# Patient Record
Sex: Female | Born: 1935 | Race: White | Hispanic: No | Marital: Married | Smoking: Never smoker
Health system: Southern US, Community
[De-identification: ages and names within clinical notes are randomized; demographics above are authoritative.]

## PROBLEM LIST (undated history)

## (undated) DIAGNOSIS — F0390 Unspecified dementia without behavioral disturbance: Secondary | ICD-10-CM

## (undated) DIAGNOSIS — F419 Anxiety disorder, unspecified: Secondary | ICD-10-CM

## (undated) DIAGNOSIS — E039 Hypothyroidism, unspecified: Secondary | ICD-10-CM

## (undated) DIAGNOSIS — K219 Gastro-esophageal reflux disease without esophagitis: Secondary | ICD-10-CM

## (undated) DIAGNOSIS — E119 Type 2 diabetes mellitus without complications: Secondary | ICD-10-CM

## (undated) DIAGNOSIS — M810 Age-related osteoporosis without current pathological fracture: Secondary | ICD-10-CM

## (undated) DIAGNOSIS — I10 Essential (primary) hypertension: Secondary | ICD-10-CM

## (undated) DIAGNOSIS — L89159 Pressure ulcer of sacral region, unspecified stage: Secondary | ICD-10-CM

---

## 2003-10-07 ENCOUNTER — Ambulatory Visit: Payer: Self-pay | Admitting: Unknown Physician Specialty

## 2003-10-17 ENCOUNTER — Emergency Department: Payer: Self-pay | Admitting: Emergency Medicine

## 2003-11-18 ENCOUNTER — Other Ambulatory Visit: Payer: Self-pay

## 2003-11-18 ENCOUNTER — Emergency Department: Payer: Self-pay | Admitting: Emergency Medicine

## 2004-05-11 ENCOUNTER — Ambulatory Visit: Payer: Self-pay | Admitting: Unknown Physician Specialty

## 2004-06-08 ENCOUNTER — Ambulatory Visit: Payer: Self-pay

## 2004-06-15 ENCOUNTER — Ambulatory Visit: Payer: Self-pay | Admitting: Unknown Physician Specialty

## 2004-07-04 ENCOUNTER — Other Ambulatory Visit: Payer: Self-pay

## 2004-07-04 ENCOUNTER — Emergency Department: Payer: Self-pay | Admitting: Emergency Medicine

## 2004-11-24 ENCOUNTER — Emergency Department: Payer: Self-pay | Admitting: Unknown Physician Specialty

## 2004-12-06 ENCOUNTER — Inpatient Hospital Stay: Payer: Self-pay | Admitting: Internal Medicine

## 2004-12-06 ENCOUNTER — Other Ambulatory Visit: Payer: Self-pay

## 2005-05-09 ENCOUNTER — Ambulatory Visit: Payer: Self-pay | Admitting: Internal Medicine

## 2005-05-09 DIAGNOSIS — E119 Type 2 diabetes mellitus without complications: Secondary | ICD-10-CM

## 2005-05-17 ENCOUNTER — Ambulatory Visit: Payer: Self-pay | Admitting: *Deleted

## 2005-05-31 ENCOUNTER — Ambulatory Visit: Payer: Self-pay | Admitting: *Deleted

## 2005-06-11 ENCOUNTER — Ambulatory Visit: Payer: Self-pay | Admitting: *Deleted

## 2005-07-02 ENCOUNTER — Ambulatory Visit: Payer: Self-pay | Admitting: *Deleted

## 2005-07-19 ENCOUNTER — Ambulatory Visit: Payer: Self-pay | Admitting: *Deleted

## 2005-07-27 ENCOUNTER — Ambulatory Visit: Payer: Self-pay | Admitting: *Deleted

## 2005-08-03 ENCOUNTER — Ambulatory Visit: Payer: Self-pay | Admitting: *Deleted

## 2005-09-20 ENCOUNTER — Ambulatory Visit: Payer: Self-pay | Admitting: *Deleted

## 2005-10-10 ENCOUNTER — Ambulatory Visit: Payer: Self-pay | Admitting: Internal Medicine

## 2005-11-29 ENCOUNTER — Ambulatory Visit: Payer: Self-pay | Admitting: *Deleted

## 2006-01-19 ENCOUNTER — Emergency Department: Payer: Self-pay | Admitting: Emergency Medicine

## 2006-01-19 ENCOUNTER — Other Ambulatory Visit: Payer: Self-pay

## 2006-01-21 ENCOUNTER — Ambulatory Visit: Payer: Self-pay | Admitting: *Deleted

## 2006-02-26 ENCOUNTER — Ambulatory Visit: Payer: Self-pay | Admitting: Unknown Physician Specialty

## 2006-03-21 ENCOUNTER — Ambulatory Visit: Payer: Self-pay | Admitting: *Deleted

## 2006-04-10 DIAGNOSIS — I10 Essential (primary) hypertension: Secondary | ICD-10-CM

## 2006-04-10 DIAGNOSIS — E1149 Type 2 diabetes mellitus with other diabetic neurological complication: Secondary | ICD-10-CM

## 2006-04-10 DIAGNOSIS — E039 Hypothyroidism, unspecified: Secondary | ICD-10-CM | POA: Insufficient documentation

## 2006-04-10 DIAGNOSIS — K219 Gastro-esophageal reflux disease without esophagitis: Secondary | ICD-10-CM | POA: Insufficient documentation

## 2006-04-10 DIAGNOSIS — E7800 Pure hypercholesterolemia, unspecified: Secondary | ICD-10-CM | POA: Insufficient documentation

## 2006-04-10 DIAGNOSIS — I2510 Atherosclerotic heart disease of native coronary artery without angina pectoris: Secondary | ICD-10-CM | POA: Insufficient documentation

## 2006-04-10 DIAGNOSIS — K5900 Constipation, unspecified: Secondary | ICD-10-CM | POA: Insufficient documentation

## 2006-04-10 DIAGNOSIS — F068 Other specified mental disorders due to known physiological condition: Secondary | ICD-10-CM | POA: Insufficient documentation

## 2006-04-10 DIAGNOSIS — F411 Generalized anxiety disorder: Secondary | ICD-10-CM | POA: Insufficient documentation

## 2006-05-16 ENCOUNTER — Ambulatory Visit: Payer: Self-pay | Admitting: *Deleted

## 2006-05-28 ENCOUNTER — Telehealth (INDEPENDENT_AMBULATORY_CARE_PROVIDER_SITE_OTHER): Payer: Self-pay | Admitting: *Deleted

## 2006-06-05 ENCOUNTER — Encounter (INDEPENDENT_AMBULATORY_CARE_PROVIDER_SITE_OTHER): Payer: Self-pay | Admitting: *Deleted

## 2006-06-27 ENCOUNTER — Encounter (INDEPENDENT_AMBULATORY_CARE_PROVIDER_SITE_OTHER): Payer: Self-pay | Admitting: *Deleted

## 2007-01-14 ENCOUNTER — Emergency Department: Payer: Self-pay | Admitting: Internal Medicine

## 2007-01-14 ENCOUNTER — Other Ambulatory Visit: Payer: Self-pay

## 2007-08-23 ENCOUNTER — Encounter: Payer: Self-pay | Admitting: Internal Medicine

## 2007-09-05 ENCOUNTER — Encounter: Payer: Self-pay | Admitting: Internal Medicine

## 2007-10-13 ENCOUNTER — Emergency Department: Payer: Self-pay | Admitting: Emergency Medicine

## 2007-10-13 ENCOUNTER — Other Ambulatory Visit: Payer: Self-pay

## 2007-10-24 ENCOUNTER — Encounter: Payer: Self-pay | Admitting: Internal Medicine

## 2009-04-25 ENCOUNTER — Ambulatory Visit: Payer: Self-pay | Admitting: Unknown Physician Specialty

## 2011-03-11 ENCOUNTER — Emergency Department: Payer: Self-pay | Admitting: Emergency Medicine

## 2011-03-11 LAB — COMPREHENSIVE METABOLIC PANEL
Alkaline Phosphatase: 81 U/L (ref 50–136)
Anion Gap: 13 (ref 7–16)
BUN: 25 mg/dL — ABNORMAL HIGH (ref 7–18)
Calcium, Total: 9.6 mg/dL (ref 8.5–10.1)
Chloride: 98 mmol/L (ref 98–107)
Co2: 23 mmol/L (ref 21–32)
Creatinine: 1.39 mg/dL — ABNORMAL HIGH (ref 0.60–1.30)
EGFR (African American): 46 — ABNORMAL LOW
EGFR (Non-African Amer.): 38 — ABNORMAL LOW
Potassium: 4.2 mmol/L (ref 3.5–5.1)
SGOT(AST): 31 U/L (ref 15–37)
SGPT (ALT): 22 U/L
Sodium: 134 mmol/L — ABNORMAL LOW (ref 136–145)
Total Protein: 8.1 g/dL (ref 6.4–8.2)

## 2011-03-11 LAB — CBC WITH DIFFERENTIAL/PLATELET
Basophil #: 0 10*3/uL (ref 0.0–0.1)
Basophil %: 0.1 %
HCT: 38.8 % (ref 35.0–47.0)
HGB: 13.2 g/dL (ref 12.0–16.0)
Lymphocyte #: 1.3 10*3/uL (ref 1.0–3.6)
Lymphocyte %: 10.3 %
MCV: 96 fL (ref 80–100)
Monocyte #: 0.7 10*3/uL (ref 0.0–0.7)
Monocyte %: 6.1 %
Neutrophil %: 83.5 %
Platelet: 136 10*3/uL — ABNORMAL LOW (ref 150–440)
RBC: 4.03 10*6/uL (ref 3.80–5.20)
RDW: 12.9 % (ref 11.5–14.5)
WBC: 12.2 10*3/uL — ABNORMAL HIGH (ref 3.6–11.0)

## 2011-03-11 LAB — URINALYSIS, COMPLETE
Bilirubin,UR: NEGATIVE
Blood: NEGATIVE
Glucose,UR: 50 mg/dL (ref 0–75)
Hyaline Cast: 3
Ph: 6 (ref 4.5–8.0)
Protein: 30
Specific Gravity: 1.015 (ref 1.003–1.030)
WBC UR: 7 /HPF (ref 0–5)

## 2011-03-11 LAB — TROPONIN I: Troponin-I: 0.05 ng/mL

## 2011-03-14 ENCOUNTER — Ambulatory Visit: Payer: Self-pay | Admitting: Family Medicine

## 2012-07-24 ENCOUNTER — Inpatient Hospital Stay: Payer: Self-pay | Admitting: Student

## 2012-07-24 LAB — URINALYSIS, COMPLETE
Bilirubin,UR: NEGATIVE
Glucose,UR: NEGATIVE mg/dL (ref 0–75)
Ketone: NEGATIVE
Protein: 100
RBC,UR: 24 /HPF (ref 0–5)
Specific Gravity: 1.014 (ref 1.003–1.030)
Squamous Epithelial: NONE SEEN
WBC UR: 3083 /HPF (ref 0–5)

## 2012-07-24 LAB — TROPONIN I: Troponin-I: <0.02 ng/mL

## 2012-07-24 LAB — CBC WITH DIFFERENTIAL/PLATELET
Basophil #: 0.1 10*3/uL (ref 0.0–0.1)
Lymphocyte %: 35.6 %
MCH: 32.3 pg (ref 26.0–34.0)
Monocyte #: 0.9 x10 3/mm (ref 0.2–0.9)
Monocyte %: 9.2 %
Neutrophil %: 54.1 %
RBC: 3.92 10*6/uL (ref 3.80–5.20)
WBC: 9.9 10*3/uL (ref 3.6–11.0)

## 2012-07-24 LAB — COMPREHENSIVE METABOLIC PANEL
Alkaline Phosphatase: 87 U/L (ref 50–136)
Bilirubin,Total: 0.4 mg/dL (ref 0.2–1.0)
Calcium, Total: 9.8 mg/dL (ref 8.5–10.1)
Chloride: 101 mmol/L (ref 98–107)
Co2: 22 mmol/L (ref 21–32)
Potassium: 5.2 mmol/L — ABNORMAL HIGH (ref 3.5–5.1)
Total Protein: 7.5 g/dL (ref 6.4–8.2)

## 2012-07-25 LAB — BASIC METABOLIC PANEL
BUN: 28 mg/dL — ABNORMAL HIGH (ref 7–18)
Calcium, Total: 9.3 mg/dL (ref 8.5–10.1)
Creatinine: 1.14 mg/dL (ref 0.60–1.30)
EGFR (African American): 48 — ABNORMAL LOW
EGFR (Non-African Amer.): 42 — ABNORMAL LOW
Glucose: 141 mg/dL — ABNORMAL HIGH (ref 65–99)
Sodium: 135 mmol/L — ABNORMAL LOW (ref 136–145)

## 2012-07-26 LAB — BASIC METABOLIC PANEL
Anion Gap: 7 (ref 7–16)
Calcium, Total: 9.3 mg/dL (ref 8.5–10.1)
Chloride: 104 mmol/L (ref 98–107)
Co2: 24 mmol/L (ref 21–32)
Potassium: 4 mmol/L (ref 3.5–5.1)
Sodium: 135 mmol/L — ABNORMAL LOW (ref 136–145)

## 2012-07-27 LAB — URINE CULTURE

## 2012-10-07 ENCOUNTER — Emergency Department: Payer: Self-pay | Admitting: Emergency Medicine

## 2012-10-07 LAB — URINALYSIS, COMPLETE
Bilirubin,UR: NEGATIVE
Blood: NEGATIVE
Ph: 5 (ref 4.5–8.0)
RBC,UR: 3 /HPF (ref 0–5)
Squamous Epithelial: 6
WBC UR: 160 /HPF (ref 0–5)

## 2012-10-07 LAB — COMPREHENSIVE METABOLIC PANEL
Albumin: 3.3 g/dL — ABNORMAL LOW (ref 3.4–5.0)
Anion Gap: 4 — ABNORMAL LOW (ref 7–16)
BUN: 28 mg/dL — ABNORMAL HIGH (ref 7–18)
Bilirubin,Total: 0.4 mg/dL (ref 0.2–1.0)
Calcium, Total: 9.7 mg/dL (ref 8.5–10.1)
Co2: 26 mmol/L (ref 21–32)
EGFR (African American): 40 — ABNORMAL LOW
EGFR (Non-African Amer.): 35 — ABNORMAL LOW
Osmolality: 279 (ref 275–301)
Sodium: 135 mmol/L — ABNORMAL LOW (ref 136–145)
Total Protein: 7.6 g/dL (ref 6.4–8.2)

## 2012-10-07 LAB — CBC
HCT: 37.7 % (ref 35.0–47.0)
HGB: 13.2 g/dL (ref 12.0–16.0)
MCH: 33.5 pg (ref 26.0–34.0)
MCHC: 34.8 g/dL (ref 32.0–36.0)
Platelet: 154 10*3/uL (ref 150–440)
RBC: 3.93 10*6/uL (ref 3.80–5.20)
WBC: 10.5 10*3/uL (ref 3.6–11.0)

## 2013-01-28 ENCOUNTER — Emergency Department: Payer: Self-pay | Admitting: Internal Medicine

## 2013-01-28 LAB — CBC
HCT: 36 % (ref 35.0–47.0)
HGB: 12.5 g/dL (ref 12.0–16.0)
MCH: 33.3 pg (ref 26.0–34.0)
MCHC: 34.7 g/dL (ref 32.0–36.0)
MCV: 96 fL (ref 80–100)
PLATELETS: 143 10*3/uL — AB (ref 150–440)
RBC: 3.75 10*6/uL — ABNORMAL LOW (ref 3.80–5.20)
RDW: 13 % (ref 11.5–14.5)
WBC: 8.7 10*3/uL (ref 3.6–11.0)

## 2013-01-28 LAB — URINALYSIS, COMPLETE
BLOOD: NEGATIVE
Bilirubin,UR: NEGATIVE
Glucose,UR: NEGATIVE mg/dL (ref 0–75)
KETONE: NEGATIVE
Nitrite: NEGATIVE
PH: 5 (ref 4.5–8.0)
Protein: 100
SPECIFIC GRAVITY: 1.015 (ref 1.003–1.030)
WBC UR: >30 /HPF (ref 0–5)

## 2013-01-28 LAB — COMPREHENSIVE METABOLIC PANEL
ALBUMIN: 3.4 g/dL (ref 3.4–5.0)
AST: 21 U/L (ref 15–37)
Alkaline Phosphatase: 77 U/L
Anion Gap: 6 — ABNORMAL LOW (ref 7–16)
BUN: 35 mg/dL — ABNORMAL HIGH (ref 7–18)
Bilirubin,Total: 0.4 mg/dL (ref 0.2–1.0)
CHLORIDE: 105 mmol/L (ref 98–107)
CO2: 24 mmol/L (ref 21–32)
CREATININE: 1.47 mg/dL — AB (ref 0.60–1.30)
Calcium, Total: 9.6 mg/dL (ref 8.5–10.1)
EGFR (African American): 35 — ABNORMAL LOW
EGFR (Non-African Amer.): 30 — ABNORMAL LOW
GLUCOSE: 154 mg/dL — AB (ref 65–99)
OSMOLALITY: 281 (ref 275–301)
Potassium: 4.6 mmol/L (ref 3.5–5.1)
SGPT (ALT): 27 U/L (ref 12–78)
Sodium: 135 mmol/L — ABNORMAL LOW (ref 136–145)
Total Protein: 7.3 g/dL (ref 6.4–8.2)

## 2013-01-28 LAB — TROPONIN I: Troponin-I: <0.02 ng/mL

## 2013-05-27 ENCOUNTER — Inpatient Hospital Stay: Payer: Self-pay | Admitting: Internal Medicine

## 2013-05-27 LAB — URINALYSIS, COMPLETE
BILIRUBIN, UR: NEGATIVE
Bacteria: NONE SEEN
GLUCOSE, UR: NEGATIVE mg/dL (ref 0–75)
KETONE: NEGATIVE
Nitrite: NEGATIVE
PH: 6 (ref 4.5–8.0)
PROTEIN: NEGATIVE
Specific Gravity: 1.004 (ref 1.003–1.030)
Squamous Epithelial: 7
WBC UR: 41 /HPF (ref 0–5)

## 2013-05-27 LAB — COMPREHENSIVE METABOLIC PANEL
ANION GAP: 8 (ref 7–16)
Albumin: 3.1 g/dL — ABNORMAL LOW (ref 3.4–5.0)
Alkaline Phosphatase: 74 U/L
BUN: 21 mg/dL — ABNORMAL HIGH (ref 7–18)
Bilirubin,Total: 0.5 mg/dL (ref 0.2–1.0)
CALCIUM: 9.1 mg/dL (ref 8.5–10.1)
CHLORIDE: 84 mmol/L — AB (ref 98–107)
Co2: 23 mmol/L (ref 21–32)
Creatinine: 1.08 mg/dL (ref 0.60–1.30)
EGFR (African American): 51 — ABNORMAL LOW
EGFR (Non-African Amer.): 44 — ABNORMAL LOW
GLUCOSE: 128 mg/dL — AB (ref 65–99)
OSMOLALITY: 238 (ref 275–301)
POTASSIUM: 5 mmol/L (ref 3.5–5.1)
SGOT(AST): 29 U/L (ref 15–37)
SGPT (ALT): 24 U/L (ref 12–78)
Sodium: 115 mmol/L — CL (ref 136–145)
TOTAL PROTEIN: 7.2 g/dL (ref 6.4–8.2)

## 2013-05-27 LAB — CBC
HCT: 34.2 % — AB (ref 35.0–47.0)
HGB: 12 g/dL (ref 12.0–16.0)
MCH: 31.7 pg (ref 26.0–34.0)
MCHC: 35.1 g/dL (ref 32.0–36.0)
MCV: 91 fL (ref 80–100)
PLATELETS: 155 10*3/uL (ref 150–440)
RBC: 3.78 10*6/uL — AB (ref 3.80–5.20)
RDW: 12.5 % (ref 11.5–14.5)
WBC: 11.7 10*3/uL — ABNORMAL HIGH (ref 3.6–11.0)

## 2013-05-27 LAB — TSH: Thyroid Stimulating Horm: 2.3 u[IU]/mL

## 2013-05-27 LAB — TROPONIN I

## 2013-05-27 LAB — HEMOGLOBIN A1C: HEMOGLOBIN A1C: 7.8 % — AB (ref 4.2–6.3)

## 2013-05-28 LAB — OSMOLALITY: OSMOLALITY: 250 mosm/kg — AB (ref 280–301)

## 2013-05-28 LAB — OSMOLALITY, URINE
Osmolality: 745 mOsm/kg
Osmolality: 292 mOsm/kg

## 2013-05-28 LAB — CBC WITH DIFFERENTIAL/PLATELET
Basophil #: 0.1 10*3/uL (ref 0.0–0.1)
Basophil %: 0.6 %
EOS PCT: 0.9 %
Eosinophil #: 0.1 10*3/uL (ref 0.0–0.7)
HCT: 34.9 % — AB (ref 35.0–47.0)
HGB: 12.4 g/dL (ref 12.0–16.0)
LYMPHS ABS: 2.8 10*3/uL (ref 1.0–3.6)
Lymphocyte %: 31.1 %
MCH: 32.4 pg (ref 26.0–34.0)
MCHC: 35.5 g/dL (ref 32.0–36.0)
MCV: 91 fL (ref 80–100)
Monocyte #: 0.7 x10 3/mm (ref 0.2–0.9)
Monocyte %: 8.1 %
NEUTROS ABS: 5.3 10*3/uL (ref 1.4–6.5)
NEUTROS PCT: 59.3 %
Platelet: 157 10*3/uL (ref 150–440)
RBC: 3.82 10*6/uL (ref 3.80–5.20)
RDW: 12.7 % (ref 11.5–14.5)
WBC: 9 10*3/uL (ref 3.6–11.0)

## 2013-05-28 LAB — BASIC METABOLIC PANEL
Anion Gap: 6 — ABNORMAL LOW (ref 7–16)
BUN: 18 mg/dL (ref 7–18)
CALCIUM: 9.2 mg/dL (ref 8.5–10.1)
Chloride: 82 mmol/L — ABNORMAL LOW (ref 98–107)
Co2: 28 mmol/L (ref 21–32)
Creatinine: 1.15 mg/dL (ref 0.60–1.30)
EGFR (African American): 47 — ABNORMAL LOW
EGFR (Non-African Amer.): 41 — ABNORMAL LOW
Glucose: 150 mg/dL — ABNORMAL HIGH (ref 65–99)
Osmolality: 240 (ref 275–301)
Potassium: 4.2 mmol/L (ref 3.5–5.1)
Sodium: 116 mmol/L — CL (ref 136–145)

## 2013-05-28 LAB — URIC ACID: Uric Acid: 3.3 mg/dL (ref 2.6–6.0)

## 2013-05-28 LAB — SODIUM
Sodium: 115 mmol/L — CL (ref 136–145)
Sodium: 115 mmol/L — CL (ref 136–145)

## 2013-05-29 LAB — SODIUM
SODIUM: 116 mmol/L — AB (ref 136–145)
SODIUM: 118 mmol/L — AB (ref 136–145)
Sodium: 121 mmol/L — ABNORMAL LOW (ref 136–145)

## 2013-05-29 LAB — BASIC METABOLIC PANEL
ANION GAP: 8 (ref 7–16)
BUN: 20 mg/dL — AB (ref 7–18)
CHLORIDE: 83 mmol/L — AB (ref 98–107)
CREATININE: 0.95 mg/dL (ref 0.60–1.30)
Calcium, Total: 8.7 mg/dL (ref 8.5–10.1)
Co2: 27 mmol/L (ref 21–32)
EGFR (Non-African Amer.): 52 — ABNORMAL LOW
GFR CALC AF AMER: 60 — AB
Glucose: 133 mg/dL — ABNORMAL HIGH (ref 65–99)
OSMOLALITY: 243 (ref 275–301)
Potassium: 4.2 mmol/L (ref 3.5–5.1)
SODIUM: 118 mmol/L — AB (ref 136–145)

## 2013-05-30 LAB — SODIUM
SODIUM: 128 mmol/L — AB (ref 136–145)
Sodium: 124 mmol/L — ABNORMAL LOW (ref 136–145)
Sodium: 127 mmol/L — ABNORMAL LOW (ref 136–145)
Sodium: 128 mmol/L — ABNORMAL LOW (ref 136–145)
Sodium: 130 mmol/L — ABNORMAL LOW (ref 136–145)
Sodium: 131 mmol/L — ABNORMAL LOW (ref 136–145)

## 2013-05-30 LAB — AMMONIA: AMMONIA, PLASMA: 13 umol/L (ref 11–32)

## 2013-05-31 LAB — BASIC METABOLIC PANEL
Anion Gap: 7 (ref 7–16)
BUN: 15 mg/dL (ref 7–18)
CALCIUM: 9.3 mg/dL (ref 8.5–10.1)
CREATININE: 1.02 mg/dL (ref 0.60–1.30)
Chloride: 99 mmol/L (ref 98–107)
Co2: 25 mmol/L (ref 21–32)
EGFR (Non-African Amer.): 47 — ABNORMAL LOW
GFR CALC AF AMER: 55 — AB
GLUCOSE: 143 mg/dL — AB (ref 65–99)
Osmolality: 266 (ref 275–301)
POTASSIUM: 3.7 mmol/L (ref 3.5–5.1)
SODIUM: 131 mmol/L — AB (ref 136–145)

## 2013-05-31 LAB — ALBUMIN: Albumin: 3.1 g/dL — ABNORMAL LOW (ref 3.4–5.0)

## 2013-05-31 LAB — SODIUM
SODIUM: 132 mmol/L — AB (ref 136–145)
Sodium: 132 mmol/L — ABNORMAL LOW (ref 136–145)
Sodium: 133 mmol/L — ABNORMAL LOW (ref 136–145)
Sodium: 134 mmol/L — ABNORMAL LOW (ref 136–145)

## 2013-06-01 ENCOUNTER — Ambulatory Visit: Payer: Self-pay | Admitting: Neurology

## 2013-06-01 LAB — CULTURE, BLOOD (SINGLE)

## 2013-06-01 LAB — CBC WITH DIFFERENTIAL/PLATELET
Basophil #: 0.1 10*3/uL (ref 0.0–0.1)
Basophil %: 0.7 %
Eosinophil #: 0 10*3/uL (ref 0.0–0.7)
Eosinophil %: 0.4 %
HCT: 30.5 % — AB (ref 35.0–47.0)
HGB: 10.4 g/dL — ABNORMAL LOW (ref 12.0–16.0)
LYMPHS PCT: 31.5 %
Lymphocyte #: 3.4 10*3/uL (ref 1.0–3.6)
MCH: 32.1 pg (ref 26.0–34.0)
MCHC: 34.1 g/dL (ref 32.0–36.0)
MCV: 94 fL (ref 80–100)
Monocyte #: 1.2 x10 3/mm — ABNORMAL HIGH (ref 0.2–0.9)
Monocyte %: 11.2 %
NEUTROS ABS: 6 10*3/uL (ref 1.4–6.5)
Neutrophil %: 56.2 %
Platelet: 155 10*3/uL (ref 150–440)
RBC: 3.24 10*6/uL — ABNORMAL LOW (ref 3.80–5.20)
RDW: 13 % (ref 11.5–14.5)
WBC: 10.7 10*3/uL (ref 3.6–11.0)

## 2013-06-01 LAB — PHENYTOIN LEVEL, TOTAL: Dilantin: 12.2 ug/mL (ref 10.0–20.0)

## 2013-06-01 LAB — SODIUM: SODIUM: 134 mmol/L — AB (ref 136–145)

## 2013-06-02 LAB — PHENYTOIN LEVEL, TOTAL: DILANTIN: 14.4 ug/mL (ref 10.0–20.0)

## 2013-06-02 LAB — SODIUM: SODIUM: 132 mmol/L — AB (ref 136–145)

## 2013-06-03 LAB — URINE CULTURE

## 2013-06-15 ENCOUNTER — Emergency Department: Payer: Self-pay | Admitting: Emergency Medicine

## 2013-06-18 ENCOUNTER — Emergency Department: Payer: Self-pay

## 2013-06-23 ENCOUNTER — Emergency Department: Payer: Self-pay | Admitting: Emergency Medicine

## 2013-06-27 ENCOUNTER — Inpatient Hospital Stay: Payer: Self-pay | Admitting: Surgery

## 2013-06-27 LAB — URINALYSIS, COMPLETE
BILIRUBIN, UR: NEGATIVE
Glucose,UR: >=500 mg/dL (ref 0–75)
Ketone: NEGATIVE
Nitrite: NEGATIVE
Ph: 6 (ref 4.5–8.0)
Specific Gravity: 1.006 (ref 1.003–1.030)
Squamous Epithelial: <1
WBC UR: 76 /HPF (ref 0–5)

## 2013-06-27 LAB — CBC
HCT: 34.2 % — AB (ref 35.0–47.0)
HGB: 11.7 g/dL — AB (ref 12.0–16.0)
MCH: 31.5 pg (ref 26.0–34.0)
MCHC: 34.3 g/dL (ref 32.0–36.0)
MCV: 92 fL (ref 80–100)
Platelet: 192 10*3/uL (ref 150–440)
RBC: 3.72 10*6/uL — ABNORMAL LOW (ref 3.80–5.20)
RDW: 13.1 % (ref 11.5–14.5)
WBC: 11.7 10*3/uL — ABNORMAL HIGH (ref 3.6–11.0)

## 2013-06-27 LAB — OSMOLALITY: OSMOLALITY: 258 mosm/kg — AB (ref 280–301)

## 2013-06-27 LAB — COMPREHENSIVE METABOLIC PANEL
ALT: 50 U/L (ref 12–78)
ANION GAP: 5 — AB (ref 7–16)
AST: 34 U/L (ref 15–37)
Albumin: 3 g/dL — ABNORMAL LOW (ref 3.4–5.0)
Alkaline Phosphatase: 193 U/L — ABNORMAL HIGH
BUN: 18 mg/dL (ref 7–18)
Bilirubin,Total: 0.4 mg/dL (ref 0.2–1.0)
CALCIUM: 8.8 mg/dL (ref 8.5–10.1)
Chloride: 81 mmol/L — ABNORMAL LOW (ref 98–107)
Co2: 27 mmol/L (ref 21–32)
Creatinine: 0.71 mg/dL (ref 0.60–1.30)
EGFR (African American): >60
EGFR (Non-African Amer.): >60
Glucose: 271 mg/dL — ABNORMAL HIGH (ref 65–99)
Osmolality: 241 (ref 275–301)
Potassium: 4.6 mmol/L (ref 3.5–5.1)
SODIUM: 113 mmol/L — AB (ref 136–145)
TOTAL PROTEIN: 6.9 g/dL (ref 6.4–8.2)

## 2013-06-27 LAB — OSMOLALITY, URINE: OSMOLALITY: 339 mosm/kg

## 2013-06-27 LAB — URIC ACID: Uric Acid: 1.8 mg/dL — ABNORMAL LOW (ref 2.6–6.0)

## 2013-06-27 LAB — SODIUM
SODIUM: 116 mmol/L — AB (ref 136–145)
Sodium: 121 mmol/L — ABNORMAL LOW (ref 136–145)
Sodium: 117 mmol/L — CL (ref 136–145)
Sodium: 115 mmol/L — CL (ref 136–145)

## 2013-06-27 LAB — SODIUM, URINE, RANDOM: SODIUM, URINE RANDOM: 75 mmol/L (ref 20–110)

## 2013-06-28 LAB — BASIC METABOLIC PANEL
Anion Gap: 5 — ABNORMAL LOW (ref 7–16)
BUN: 13 mg/dL (ref 7–18)
CO2: 27 mmol/L (ref 21–32)
Calcium, Total: 8 mg/dL — ABNORMAL LOW (ref 8.5–10.1)
Chloride: 86 mmol/L — ABNORMAL LOW (ref 98–107)
Creatinine: 0.94 mg/dL (ref 0.60–1.30)
EGFR (African American): >60
GFR CALC NON AF AMER: 52 — AB
GLUCOSE: 114 mg/dL — AB (ref 65–99)
OSMOLALITY: 239 (ref 275–301)
POTASSIUM: 4.2 mmol/L (ref 3.5–5.1)
Sodium: 118 mmol/L — CL (ref 136–145)

## 2013-06-28 LAB — MAGNESIUM
MAGNESIUM: 1.6 mg/dL — AB
MAGNESIUM: 2.2 mg/dL

## 2013-06-28 LAB — SODIUM
SODIUM: 119 mmol/L — AB (ref 136–145)
SODIUM: 124 mmol/L — AB (ref 136–145)
Sodium: 126 mmol/L — ABNORMAL LOW (ref 136–145)
Sodium: 122 mmol/L — ABNORMAL LOW (ref 136–145)
Sodium: 124 mmol/L — ABNORMAL LOW (ref 136–145)

## 2013-06-29 LAB — SODIUM
SODIUM: 130 mmol/L — AB (ref 136–145)
SODIUM: 128 mmol/L — AB (ref 136–145)
Sodium: 130 mmol/L — ABNORMAL LOW (ref 136–145)
Sodium: 129 mmol/L — ABNORMAL LOW (ref 136–145)

## 2013-06-29 LAB — URINE CULTURE

## 2013-06-30 LAB — SODIUM: Sodium: 128 mmol/L — ABNORMAL LOW (ref 136–145)

## 2013-08-22 ENCOUNTER — Emergency Department: Payer: Self-pay | Admitting: Emergency Medicine

## 2013-08-22 LAB — URINALYSIS, COMPLETE
BILIRUBIN, UR: NEGATIVE
BLOOD: NEGATIVE
Ketone: NEGATIVE
NITRITE: POSITIVE
PROTEIN: NEGATIVE
Ph: 6 (ref 4.5–8.0)
Specific Gravity: 1.006 (ref 1.003–1.030)
WBC UR: 24 /HPF (ref 0–5)

## 2013-08-22 LAB — BASIC METABOLIC PANEL
ANION GAP: 10 (ref 7–16)
BUN: 18 mg/dL (ref 7–18)
CREATININE: 1.15 mg/dL (ref 0.60–1.30)
Calcium, Total: 9.5 mg/dL (ref 8.5–10.1)
Chloride: 98 mmol/L (ref 98–107)
Co2: 25 mmol/L (ref 21–32)
EGFR (African American): 47 — ABNORMAL LOW
EGFR (Non-African Amer.): 41 — ABNORMAL LOW
Glucose: 227 mg/dL — ABNORMAL HIGH (ref 65–99)
Osmolality: 275 (ref 275–301)
Potassium: 4.3 mmol/L (ref 3.5–5.1)
Sodium: 133 mmol/L — ABNORMAL LOW (ref 136–145)

## 2013-08-22 LAB — CBC
HCT: 39.1 % (ref 35.0–47.0)
HGB: 12.7 g/dL (ref 12.0–16.0)
MCH: 30 pg (ref 26.0–34.0)
MCHC: 32.4 g/dL (ref 32.0–36.0)
MCV: 93 fL (ref 80–100)
Platelet: 206 10*3/uL (ref 150–440)
RBC: 4.22 10*6/uL (ref 3.80–5.20)
RDW: 13.4 % (ref 11.5–14.5)
WBC: 9.6 10*3/uL (ref 3.6–11.0)

## 2013-08-26 ENCOUNTER — Emergency Department: Payer: Self-pay | Admitting: Emergency Medicine

## 2013-08-26 LAB — URINALYSIS, COMPLETE
Bilirubin,UR: NEGATIVE
Blood: NEGATIVE
Glucose,UR: 50 mg/dL (ref 0–75)
Ketone: NEGATIVE
NITRITE: POSITIVE
Ph: 5 (ref 4.5–8.0)
Protein: 30
Specific Gravity: 1.015 (ref 1.003–1.030)
Squamous Epithelial: 1

## 2013-08-26 LAB — BASIC METABOLIC PANEL
Anion Gap: 10 (ref 7–16)
BUN: 19 mg/dL — ABNORMAL HIGH (ref 7–18)
Calcium, Total: 9.4 mg/dL (ref 8.5–10.1)
Chloride: 98 mmol/L (ref 98–107)
Co2: 23 mmol/L (ref 21–32)
Creatinine: 1.04 mg/dL (ref 0.60–1.30)
GFR CALC AF AMER: 54 — AB
GFR CALC NON AF AMER: 46 — AB
Glucose: 223 mg/dL — ABNORMAL HIGH (ref 65–99)
Osmolality: 272 (ref 275–301)
Potassium: 3.8 mmol/L (ref 3.5–5.1)
SODIUM: 131 mmol/L — AB (ref 136–145)

## 2013-08-26 LAB — CBC WITH DIFFERENTIAL/PLATELET
Basophil #: 0.1 10*3/uL (ref 0.0–0.1)
Basophil %: 0.6 %
EOS ABS: 0.4 10*3/uL (ref 0.0–0.7)
Eosinophil %: 3.3 %
HCT: 34.6 % — ABNORMAL LOW (ref 35.0–47.0)
HGB: 11.8 g/dL — ABNORMAL LOW (ref 12.0–16.0)
Lymphocyte #: 2.5 10*3/uL (ref 1.0–3.6)
Lymphocyte %: 23.2 %
MCH: 31.1 pg (ref 26.0–34.0)
MCHC: 34.1 g/dL (ref 32.0–36.0)
MCV: 91 fL (ref 80–100)
MONOS PCT: 10.2 %
Monocyte #: 1.1 x10 3/mm — ABNORMAL HIGH (ref 0.2–0.9)
Neutrophil #: 6.8 10*3/uL — ABNORMAL HIGH (ref 1.4–6.5)
Neutrophil %: 62.7 %
Platelet: 180 10*3/uL (ref 150–440)
RBC: 3.8 10*6/uL (ref 3.80–5.20)
RDW: 13.5 % (ref 11.5–14.5)
WBC: 10.8 10*3/uL (ref 3.6–11.0)

## 2013-08-28 LAB — URINE CULTURE

## 2013-09-04 ENCOUNTER — Encounter: Payer: Self-pay | Admitting: Surgery

## 2013-09-08 ENCOUNTER — Inpatient Hospital Stay: Payer: Self-pay | Admitting: Internal Medicine

## 2013-09-08 LAB — URINALYSIS, COMPLETE
BILIRUBIN, UR: NEGATIVE
Blood: NEGATIVE
Hyaline Cast: 3
Ketone: NEGATIVE
Nitrite: NEGATIVE
Ph: 5 (ref 4.5–8.0)
Protein: NEGATIVE
RBC,UR: 4 /HPF (ref 0–5)
Specific Gravity: 1.013 (ref 1.003–1.030)

## 2013-09-08 LAB — COMPREHENSIVE METABOLIC PANEL
ALT: 66 U/L — AB
AST: 36 U/L (ref 15–37)
Albumin: 2.5 g/dL — ABNORMAL LOW (ref 3.4–5.0)
Alkaline Phosphatase: 203 U/L — ABNORMAL HIGH
Anion Gap: 9 (ref 7–16)
BUN: 16 mg/dL (ref 7–18)
Bilirubin,Total: 0.3 mg/dL (ref 0.2–1.0)
CALCIUM: 9.3 mg/dL (ref 8.5–10.1)
CHLORIDE: 94 mmol/L — AB (ref 98–107)
CO2: 22 mmol/L (ref 21–32)
CREATININE: 1.37 mg/dL — AB (ref 0.60–1.30)
EGFR (African American): 38 — ABNORMAL LOW
GFR CALC NON AF AMER: 33 — AB
GLUCOSE: 325 mg/dL — AB (ref 65–99)
Osmolality: 265 (ref 275–301)
Potassium: 5.2 mmol/L — ABNORMAL HIGH (ref 3.5–5.1)
SODIUM: 125 mmol/L — AB (ref 136–145)
TOTAL PROTEIN: 7.2 g/dL (ref 6.4–8.2)

## 2013-09-08 LAB — CBC
HCT: 32.9 % — ABNORMAL LOW (ref 35.0–47.0)
HGB: 10.8 g/dL — ABNORMAL LOW (ref 12.0–16.0)
MCH: 30.2 pg (ref 26.0–34.0)
MCHC: 32.8 g/dL (ref 32.0–36.0)
MCV: 92 fL (ref 80–100)
Platelet: 216 10*3/uL (ref 150–440)
RBC: 3.57 10*6/uL — ABNORMAL LOW (ref 3.80–5.20)
RDW: 14.1 % (ref 11.5–14.5)
WBC: 10.6 10*3/uL (ref 3.6–11.0)

## 2013-09-08 LAB — OSMOLALITY, URINE: OSMOLALITY: 344 mosm/kg

## 2013-09-08 LAB — SODIUM, URINE, RANDOM: Sodium, Urine Random: 55 mmol/L (ref 20–110)

## 2013-09-08 LAB — OSMOLALITY: Osmolality: 270 mOsm/kg — ABNORMAL LOW (ref 280–301)

## 2013-09-08 LAB — TROPONIN I

## 2013-09-09 LAB — CBC WITH DIFFERENTIAL/PLATELET
Basophil #: 0 10*3/uL (ref 0.0–0.1)
Basophil %: 0.6 %
EOS ABS: 0.3 10*3/uL (ref 0.0–0.7)
Eosinophil %: 4.1 %
HCT: 34.4 % — AB (ref 35.0–47.0)
HGB: 10.9 g/dL — AB (ref 12.0–16.0)
Lymphocyte #: 2.6 10*3/uL (ref 1.0–3.6)
Lymphocyte %: 33.8 %
MCH: 29.4 pg (ref 26.0–34.0)
MCHC: 31.6 g/dL — ABNORMAL LOW (ref 32.0–36.0)
MCV: 93 fL (ref 80–100)
MONOS PCT: 7.8 %
Monocyte #: 0.6 x10 3/mm (ref 0.2–0.9)
Neutrophil #: 4.1 10*3/uL (ref 1.4–6.5)
Neutrophil %: 53.7 %
PLATELETS: 218 10*3/uL (ref 150–440)
RBC: 3.7 10*6/uL — AB (ref 3.80–5.20)
RDW: 14.3 % (ref 11.5–14.5)
WBC: 7.6 10*3/uL (ref 3.6–11.0)

## 2013-09-09 LAB — BASIC METABOLIC PANEL
Anion Gap: 9 (ref 7–16)
BUN: 11 mg/dL (ref 7–18)
CO2: 21 mmol/L (ref 21–32)
CREATININE: 1.22 mg/dL (ref 0.60–1.30)
Calcium, Total: 9.3 mg/dL (ref 8.5–10.1)
Chloride: 97 mmol/L — ABNORMAL LOW (ref 98–107)
EGFR (African American): 44 — ABNORMAL LOW
GFR CALC NON AF AMER: 38 — AB
Glucose: 301 mg/dL — ABNORMAL HIGH (ref 65–99)
OSMOLALITY: 266 (ref 275–301)
POTASSIUM: 4.5 mmol/L (ref 3.5–5.1)
SODIUM: 127 mmol/L — AB (ref 136–145)

## 2013-09-10 LAB — BASIC METABOLIC PANEL
Anion Gap: 4 — ABNORMAL LOW (ref 7–16)
BUN: 9 mg/dL (ref 7–18)
Calcium, Total: 9.2 mg/dL (ref 8.5–10.1)
Chloride: 98 mmol/L (ref 98–107)
Co2: 26 mmol/L (ref 21–32)
Creatinine: 1.05 mg/dL (ref 0.60–1.30)
EGFR (African American): 53 — ABNORMAL LOW
EGFR (Non-African Amer.): 46 — ABNORMAL LOW
Glucose: 269 mg/dL — ABNORMAL HIGH (ref 65–99)
Osmolality: 265 (ref 275–301)
POTASSIUM: 4.2 mmol/L (ref 3.5–5.1)
Sodium: 128 mmol/L — ABNORMAL LOW (ref 136–145)

## 2013-09-10 LAB — URINE CULTURE

## 2013-09-25 ENCOUNTER — Emergency Department: Payer: Self-pay | Admitting: Emergency Medicine

## 2013-09-25 LAB — URINALYSIS, COMPLETE
BLOOD: NEGATIVE
Bilirubin,UR: NEGATIVE
KETONE: NEGATIVE
Nitrite: NEGATIVE
Ph: 6 (ref 4.5–8.0)
Protein: 30
SPECIFIC GRAVITY: 1.011 (ref 1.003–1.030)
Squamous Epithelial: <1
WBC UR: 42 /HPF (ref 0–5)

## 2013-09-25 LAB — BASIC METABOLIC PANEL
Anion Gap: 7 (ref 7–16)
BUN: 20 mg/dL — AB (ref 7–18)
CALCIUM: 9.1 mg/dL (ref 8.5–10.1)
CO2: 25 mmol/L (ref 21–32)
CREATININE: 0.95 mg/dL (ref 0.60–1.30)
Chloride: 101 mmol/L (ref 98–107)
Glucose: 183 mg/dL — ABNORMAL HIGH (ref 65–99)
Osmolality: 274 (ref 275–301)
Potassium: 5 mmol/L (ref 3.5–5.1)
Sodium: 133 mmol/L — ABNORMAL LOW (ref 136–145)

## 2013-09-25 LAB — CBC
HCT: 36.1 % (ref 35.0–47.0)
HGB: 11.8 g/dL — ABNORMAL LOW (ref 12.0–16.0)
MCH: 29.8 pg (ref 26.0–34.0)
MCHC: 32.9 g/dL (ref 32.0–36.0)
MCV: 91 fL (ref 80–100)
Platelet: 190 10*3/uL (ref 150–440)
RBC: 3.98 10*6/uL (ref 3.80–5.20)
RDW: 14.2 % (ref 11.5–14.5)
WBC: 8.7 10*3/uL (ref 3.6–11.0)

## 2013-09-28 LAB — URINE CULTURE

## 2013-10-02 ENCOUNTER — Emergency Department: Payer: Self-pay | Admitting: Student

## 2013-10-07 ENCOUNTER — Encounter: Payer: Self-pay | Admitting: Surgery

## 2013-11-16 ENCOUNTER — Encounter: Payer: Self-pay | Admitting: Surgery

## 2014-04-23 NOTE — H&P (Signed)
PATIENT NAME:  Gina [name], Gina [name] MR#:  [id number] DATE OF BIRTH:  10/21/1917  DATE OF ADMISSION:  07/24/2012  PRIMARY CARE PHYSICIAN: Gina StainGinette Archinal, MD.   CHIEF COMPLAINT: Fall and weakness.   HISTORY OF PRESENT ILLNESS: This is a 81 year old female who [facility] the hospital [facility] after a fall and suspected syncope. The patient does not recall ever losing consciousness, so unlikely this was a syncope. The patient said this morning, she was attempting to ambulate with the help of her walker when she felt weak and she fell to the ground. She was found by the nursing staff there on the floor; unclear how long she was down, and sent over to the hospital for further evaluation.   The patient, here in the ER, was noted to have mild acute renal failure, also noted to have a urinary tract infection. Hospitalist services were contacted for further treatment and evaluation. The patient denied any prodromal symptoms of palpitations, chest pain, shortness of breath, nausea, vomiting, diarrhea, or any other associated symptoms prior to her fall today.   REVIEW OF SYSTEMS:  CONSTITUTIONAL: No documented fever. No weight gain. No weight loss.  EYES: No blurry or double vision.  ENT: No tinnitus. No postnasal drip. No redness of the oropharynx.  RESPIRATORY: No cough. No wheeze. No hemoptysis. No dyspnea.  CARDIOVASCULAR: No chest pain. No orthopnea. No palpitations. No syncope.  GASTROINTESTINAL: No nausea. No vomiting. No diarrhea. No abdominal pain. No melena or hematochezia.  GENITOURINARY: No dysuria or hematuria.  ENDOCRINE: No polyuria or nocturia. No heat or cold intolerance.  HEME: No anemia. No bruising. No bleeding.  INTEGUMENTARY: No rashes. No lesions.  MUSCULOSKELETAL: No arthritis. No swelling. No gout.  NEUROLOGIC: No numbness or tingling. No ataxia. No seizure activity.  PSYCHIATRIC: No anxiety. No insomnia. No ADD.   PAST MEDICAL HISTORY: Consistent  with diabetes, hypertension, history of recurrent UTIs, osteoporosis.   ALLERGIES: FOSAMAX.   SOCIAL HISTORY: No smoking. No alcohol abuse. No illicit drug abuse. [facility]. Mother died from complications of a stroke. Father died from unknown causes.   CURRENT MEDICATIONS: Metformin 500 mg extended-release daily, Tylenol 500 mg 4  times daily as needed, Xanax 0.25 mg 2 tabs at bedtime, nystatin topical to be applied b.i.d. as needed, omeprazole 20 mg daily, Silvadene cream to be applied to the affected area b.i.d., Robitussin 5 mL q. 6 hours as needed for cough, and Finacea topical gel 15% to be applied b.i.d. as needed.   PHYSICAL EXAMINATION ON ADMISSION:  VITAL SIGNS: Temperature is 97.4. Pulse 62. Respirations 18. Blood pressure 124/62. Saturations  98% on room air.  GENERAL: She is a pleasant-appearing female, hard of hearing, but in no apparent distress.  HEAD, EYES, EARS, NOSE, THROAT: She has a bandage on her left upper forehead from the fall, but no laceration, likely a bruise. Normocephalic. Extraocular muscles intact. Pupils equal and reactive to light. Sclerae anicteric. No conjunctival injection. No oropharyngeal erythema.  NECK: Supple. There is no jugular venous distention. No bruits. No lymphadenopathy. No thyromegaly.  HEART: Regular rate and rhythm. No murmurs. No rubs. No clicks.  LUNGS: Clear to auscultation bilaterally. No rales or rhonchi. No wheezes.  ABDOMEN: Soft, flat, nontender, nondistended. Has good bowel sounds. No hepatosplenomegaly appreciated.  EXTREMITIES: No evidence of any cyanosis, clubbing, or peripheral edema. Has +2 pedal and radial pulses bilaterally.  NEUROLOGICAL: The patient is alert, awake, and oriented x 3. No focal  motor or sensory deficits appreciated bilaterally.  SKIN: Moist and warm with no rash appreciated.  LYMPHATIC: There is no cervical or axillary lymphadenopathy.   LABORATORY, DIAGNOSTIC  AND RADIOLOGIC DATA: Serum glucose of 197, BUN 38, creatinine 1.6, sodium 132, potassium 5.2, chloride 101, bicarb 22. The patient's LFTs are within normal limits. Troponin less than 0.02. White cell count 9.9, hemoglobin 12.7, hematocrit 36.8, platelet count 178. Urinalysis shows 3+ leukocyte esterase, positive nitrites with 3000 white cells and 2+ bacteria.   The patient had a chest x-ray done, which showed no acute cardiopulmonary disease, and a CT of the head done without contrast, which showed moderate diffuse cerebral atrophy, appropriate for patient's age. No evidence of acute intracranial hemorrhage or ischemic infarction. No midline shift. No skull fracture. CT of the cervical spine showing no evidence of any acute cervical spine injury.   ASSESSMENT AND PLAN: This is a 81 year old female with a history of diabetes, hypertension, history of recurrent urinary tract infections, presents to the hospital after a fall and suspected syncope. Also noted to be in acute renal failure with a urinary tract infection.   1.  Status post fall. This is likely a mechanical fall. The patient apparently had some generalized weakness, probably related to her urinary tract infection and dehydration. She never really lost consciousness; therefore, unlikely a suspected syncopal event. I will hydrate her with IV fluids, give her IV antibiotics for a urinary tract infection, get a physical therapy consult to assess her mobility.  2.  Acute renal failure. This is likely secondary to dehydration and urinary tract infection. I will hydrate with IV fluids, follow BUN and creatinine, give her IV ceftriaxone for her urinary tract infection, and follow her urine output.  3.  Hyperkalemia, likely secondary to the acute renal failure, should improve once her renal function is better. I will repeat her potassium in the morning.  4.  Urinary tract infection. Treat her with IV ceftriaxone, follow her urine cultures.  5.  Diabetes.  Hold her metformin now given her acute renal failure. Place on sliding scale insulin. Follow blood sugars.   CODE STATUS: FULL CODE.   TIME SPENT WITH ADMISSION: 45 minutes.    ____________________________ Rolly Pancake. Cherlynn Kaiser, MD vjs:np D: 07/24/2012 17:48:00 ET T: 07/24/2012 18:11:09 ET JOB#: [id number]  cc: Rolly Pancake. Cherlynn Kaiser, MD, <Dictator> Houston Siren MD ELECTRONICALLY SIGNED 08/02/2012 15:17

## 2014-04-23 NOTE — Discharge Summary (Signed)
PATIENT NAME:  Gina [name], Gina [name] MR#:  [id number] DATE OF BIRTH:  [birth year]  DATE OF ADMISSION:  07/24/2012 DATE OF DISCHARGE:  07/27/2012  PRIMARY CARE PHYSICIAN: Dr. Stanford Scotland Archinal.  CHIEF COMPLAINT: Fall and weakness.   DISCHARGE DIAGNOSES: 1.  Falls, secondary to weakness, acute renal failure and urinary tract infection.  2.  Hypertension.  3.  Diabetes.  4.  History of recurrent urinary tract infections.  5.  Osteoporosis.  6.  Hyperkalemia, on arrival.  7.  Hypertension.   DISCHARGE MEDICATIONS: 1.  Keflex 500 mg every 12 hours for 5 days.  2.  Omeprazole 20 mg daily.  3.  Mapap 500 mg 4 times a day as needed.  4.  Metformin 500 mg once a day, extended release.  5.  Silvadene 1% topical cream, apply to affected areas 2 times a day.  6.  Finacea 15% topical gel, apply to affected areas of the 2 times a day.  7.  DC nasal spray 0.65% nasal spray, 2 sprays every 4 hours as needed while awake.  8.  Levothyroxine 112 mcg daily.  9. Metoprolol succinate 25 mg daily.  10.  Hydrochlorothiazide/triamterene 25/37.5 mg, 1 tablet once a day.  11. Verapamil 240 mg once a day.  12. Vitamin C 500 mg once a day.  13. Alprazolam 0.5 mg 2 times a day, and 1 mg at bedtime.  14. Docusate sodium 1 capsule  once a day as needed.  15.  Trazodone 50 mg once a day at bedtime.   DIET: Low-fat, low sodium, low fat, low-cholesterol ADA diet.   ACTIVITY: As tolerated. She is to follow with physical therapy.   FOLLOWUP: Please follow with PCP within 1 to 2 weeks.   SIGNIFICANT LABS AND IMAGING: Initial BUN was 38, creatinine of 1.62, sodium 132, potassium 5.2, serum CO2 of 22.   By discharge as of July 26th BUN was 16, creatinine of 0.9, sodium 135, potassium of 4. LFTs on arrival within normal limits. Initial troponin negative. Initial WBC 9.9, hemoglobin 12.7, platelets are 178, and urine cultures obtained on arrival showed Klebsiella pneumoniae, susceptible to cefazolin, ceftriaxone, and  fluoroquinolones.   Initial UA: 3+ leukocyte esterase, positive for nitrites, 24 RBCs, 3083 WBCs, 2+ bacteria.   CT of head without contrast on arrival showed moderate diffuse cerebral atrophy, appropriate for the patient's age, and mild compensatory ventriculomegaly. No evidence of acute intracranial hemorrhage or evolving ischemic infarct. No midline shift. No evidence of acute skull fracture.   A C-spine without contrast showed no evidence of acute cervical spine fracture nor dislocation. There is bulging disk material versus an end plate spur noted posteriorly at C4-C5 disk level.   HISTORY OF PRESENT ILLNESS AND HOSPITAL COURSE: For full details of H and P, please see the dictation on July 24 by Dr. Cherlynn Kaiser, but briefly this is a pleasant 81 year old female who lives in [facility] who was brought in for suspected syncope. The patient, however, did not recall losing consciousness. She apparently was attempting to ambulate without a walker when she fell down and she was brought into the hospital. She was noted to have grossly dirty urine as well as acute renal failure, admitted to the hospitalist service with some IV fluids. She was started on ceftriaxone, and urine cultures were sent. She also noted to be mildly hyperkalemic secondary to renal failure. She did improve significantly through hospitalization. The fall was likely secondary to generalized weakness, UTI, and acute renal failure and she has been working  with PT. We will discharge her with a referral with physical therapy. The acute renal failure has resolved. This is likely secondary to a UTI and dehydration. She is eating well now and the fluids have been stopped. Her hyperkalemia has resolved. Urine cultures are back. We will discharge her with an additional 5 days of Keflex. Initially we also held her metformin, and upon discharge we can resume that. Initially we did not have her complete med rec, and  started her on amlodipine, low  dose for blood pressure, but at this point she will be discharged  with her outpatient blood pressure medications.   On the day of discharge the patient remains afebrile. Temperature was 98.4 this morning, pulse rate 98, respiratory rate was 20, blood pressure 174/84. Oxygen saturation was 93% on room air.   GENERAL: The patient is an obese Caucasian female sitting in bed eating her lunch. No obvious distress.  HEENT: Normocephalic. There is a slight hematoma and bruising on the left lateral forehead. HEART: Sounds are presently irregularly irregular. No significant murmurs appreciated.  LUNGS: Clear to auscultation.  ABDOMEN: Soft, nontender.  EXTREMITIES: Does not show any lower extremity edema.   At this point she will be discharged.   Total time spent: Thirty-five 35 minutes.   THE PATIENT IS FULL CODE.     ____________________________ Krystal EatonShayiq Edison Wollschlager, MD sa:dm D: 07/27/2012 12:20:15 ET T: 07/27/2012 13:29:43 ET JOB#: [id number]  cc: Krystal EatonShayiq Ashaunte Standley, MD, <Dictator> Ginette A. Andrey SpearmanArchinal, MD Krystal EatonSHAYIQ Rexine Gowens MD ELECTRONICALLY SIGNED 08/14/2012 13:25

## 2014-04-24 NOTE — H&P (Signed)
PATIENT NAME:  [name], Kidada H MR#:  [id number] DATE OF BIRTH:  1917/03/24  DATE OF ADMISSION:  05/27/2013  PRIMARY CARE PHYSICIAN:  Dr. Marguerite OleaMoffett.   HISTORY OF PRESENT ILLNESS:  The patient is a [age] year old Caucasian female with past medical history significant for history of falls, weakness and acute renal failure, urinary tract infection, admission for the same in July 2014 comes back to the hospital with 4 times falls today.  Apparently the patient was falling in the facility where she is from on her knees over the past day or so.  However at the very end of the day she was standing up getting her medications and suddenly she went backwards.  There was no injury reported.  On arrival to the Emergency Room, she was noted to have a very low sodium level at 115 and hospitalist services were contacted for admission.  The patient herself is not able to provide much history; however, most of the history is obtained from family members.  Family is telling me that she is drinking water nonstop ever since she was discharged in July 2014 for dehydration and renal failure.  She was also noted to have lower extremity swelling.  The patient's family tells me that in the past she was on diuretic, however due to dehydration and admission in July 2014 the diuretic was stopped.  The patient was diagnosed with urinary tract infection approximately three weeks ago.  She has been receiving antibiotic therapy ever since.  The patient usually is mentally very alert and remember things.  However, whenever she gets urinary tract infections she usually does not get high fevers or any symptoms except of being disoriented and loony according to the patient's family.   PAST MEDICAL HISTORY:  Significant for history of admission in July 2014 for falls, weakness, acute renal failure, urinary tract infection.  Also history of diabetes mellitus, hypertension, recurrent urinary tract infections, osteoporosis, hypokalemia during the last  admission, also questionable syncope episode.   ALLERGIES:  FOSAMAX.  SOCIAL HISTORY:  No smoking or alcohol abuse.  No illicit drug abuse.  [facility].    FAMILY HISTORY:  The patient's mother died from complications of stroke.  The patient's father died of unknown causes.   MEDICATIONS:  According to medical records, the patient is on alprazolam 0.5 mg 2 tablets which would be 1 mg once daily at bedtime.  Alprazolam 0.5 mg twice daily in am and 4:00 p.m., nasal spray, Levothyroxine 112 mcg by mouth daily, metformin 500 mg once daily which is extended released, metoprolol succinate ER 25 mg by mouth daily, nystatin topical to groin and buttocks twice daily, omeprazole 20 mg by mouth daily, ranitidine 300 mg by mouth at bedtime, trazodone 50 mg by mouth at bedtime, verapamil extended release 240 mg by mouth daily and vitamin C 500 mg by mouth daily.   REVIEW OF SYSTEMS:   Difficult to obtain as the patient is confused, however she denies any significant chest pain, pain in general.  Admits of shortness of breath.  Admits to some cough.  No phlegm production.  Denies any significant abdominal pain or urination troubles.  Otherwise, she is not able to provide much history.  She is not reliable.  PHYSICAL EXAMINATION:  VITAL SIGNS:  On arrival to the hospital, temperature was 98.5, pulse was 65, respiration rate was 18, blood pressure 214/110, respiration rate was 18, pulse oximetry was 98% on room air.  GENERAL:  This is  a well-developed, well-nourished, obese Caucasian female in no significant distress, comfortable on the stretcher.  EYES:  Her pupils are equal, reactive to light.  Extraocular muscles intact.  No icterus or conjunctivitis.  Has normal hearing.  No pharyngeal erythema.  Mucosa is moist.  NECK:  No masses.  Supple, nontender.  Thyroid is not enlarged.  No adenopathy.  No JVD or carotid bruits bilaterally.  Full range of motion.   LUNGS:  Clear to auscultation in all fields.  No rales, rhonchi,  diminished breath sounds or wheezing.  No labored inspirations, decreased effort, dullness to percussion, overt respiratory distress.  CARDIOVASCULAR:  S1, S2 appreciated.  Rhythm is regular.  PMI not lateralized.  Chest is nontender to palpation.  Diminished pedal pulses due to significant lower extremity edema which is approximately 2+ bilaterally.  No calf tenderness or cyanosis was noted.  ABDOMEN:  Soft, nontender.  Bowel sounds are present.  No hepatosplenomegaly or masses were noted.  RECTAL:  Deferred.  MUSCLE STRENGTH:  Able to move all extremities.  No cyanosis, degenerative joint disease or kyphosis.  Gait was not tested.  SKIN:  Did not reveal any rashes, lesions, erythema, nodularity or induration.  It was warm and dry to palpation.  LYMPHATIC:  No adenopathy in the cervical region.  NEUROLOGIC:  Cranial nerves grossly intact.  Sensory is intact.  No dysarthria or aphasia.  The patient is alert, oriented to person, cooperative.  Memory is impaired, but no significant agitation or depression were noted.   LABORATORY DATA:  BMP showed a glucose of 128.  BUN and creatinine were 21 and 1.08, sodium 115, otherwise BMP was unremarkable.  The patient's albumin level was 3.1, otherwise liver enzymes were normal.  The patient's white blood cell count is elevated to 11.7, hemoglobin was 12.0, platelet count 155.  Urinalysis revealed straw, clear, negative for glucose, bilirubin or ketones, specific gravity was 1.004, pH was 6.0, 1+ blood, negative for protein or nitrites, 2+ leukocyte esterase, 1 red blood cell, 41 white blood cells.  No bacteria were seen, 7 epithelial cells were noted.  EKG not done.   ASSESSMENT AND PLAN: 1.  Hyponatremia.  Admit the patient to the medical floor.  Hyponatremia very likely related to dilution.  The patient has been drinking plenty of fluids and she is fluid-overloaded.  We will give the patient one  dose of Lasix.  We will get urine osmolality prior to Lasix.  We will restrict drinking water nonstop and we will ask nephrologist for consultation.  2.  Weakness, very likely related to hyponatremia.  Questionable urinary tract infection.  We will continue the patient on physical therapy.  3.  Questionable urinary tract infection.  Get cultures and start the patient on Rocephin.  4.  Lower extremity swelling.  We will get Doppler ultrasound to rule out deep vein thrombosis, but the patient is fluid overloaded.  We will give one dose of Lasix.  5.  Malignant essential hypertension, possibly related to stress.  We will initiate the patient on lisinopril, watching her kidney function very closely.   TIME SPENT:  50 minutes.     ____________________________ Katharina Caper, MD rv:ea D: 05/27/2013 22:01:46 ET T: 05/27/2013 23:47:49 ET JOB#: [id number]  cc: Katharina Caper, MD, <Dictator> Durward Mallard. Marguerite Olea, MD Katharina Caper MD ELECTRONICALLY SIGNED 06/27/2013 17:53

## 2014-04-24 NOTE — Discharge Summary (Signed)
PATIENT NAME:  Gina [name], [name] MR#:  [id number] DATE OF BIRTH:  [birth year]  DATE OF ADMISSION:  05/27/2013 DATE OF DISCHARGE:    ADDENDUM  In regards to questionable urinary tract infection, the patient's urinalysis initially was remarkable for 2+ leukocyte esterase as well as 41 white blood cells.  Because of concern of possible urinary tract infection, we initiated the patient on Rocephin on arrival to the hospital, 05/27/2013, however when urine cultures came back negative Rocephin was discontinued.  Unfortunately, a day later the patient's urine cultures were addressed again by microbiology and I was called about VRE in urine on 06/01/2013.  It was noted however that the patient had only 1000 colony-forming units of VRE as well as other aerobic gram-positive rod.  No further identification was given.  Also, 1000 colony-forming units.  It was felt that the patient's bacterial load was not sufficient to call it urinary tract infection, so initial Zyvox order which was given on 06/01/2013 was discontinued on 06/02/2013 and the patient is not going to be continued on Zyvox course, however if her symptoms recur and she does have urinary tract infection symptoms such as dysuria, hematuria, fevers, we recommend that the patient to be initiated on Zyvox for possible VRE UTI.  This was discussed with Dr. Sampson GoonFitzgerald.     ____________________________ Katharina Caperima Eveleigh Crumpler, MD rv:ea D: 06/02/2013 15:58:44 ET T: 06/02/2013 16:35:29 ET JOB#: [id number]  cc: Katharina Caperima Ikhlas Albo, MD, <Dictator> [place] Kolton Kienle MD ELECTRONICALLY SIGNED 06/16/2013 7:31

## 2014-04-24 NOTE — Consult Note (Signed)
PATIENT NAME:  Gina [name], [name] MR#:  [id number] DATE OF BIRTH:  [birth year]  DATE OF CONSULTATION:  06/01/2013  CONSULTING PHYSICIAN:  Pauletta BrownsYuriy Serjio Deupree, MD  REASON FOR CONSULTATION: seizure.  This is a 81 year old Caucasian female with past medical history of multiple falls, acute renal failure. The patient admitted with hyponatremia, sodium level 115, slowly replaced and it is 134 today. 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 On admission the patient is found to also have an urinary tract infection. The patient is on chronic Ativan at home. Status post CODE being called the patient being unresponsive, witnessed to have seizure activity status post Ativan, status post Dilantin load and starting Dilantin 100 q.8 with current Dilantin level 12.2. No further seizure activity as per daughter, who is at bedside. The patient's mental status is improved and is close to baseline.   PAST MEDICAL HISTORY: Significant for fall, weakness, acute renal failure and tract infection, hypertension, osteoporosis, hypokalemia, possibly syncopal episodes.   ALLERGIES: INCLUDE FOSAMAX.   SOCIAL HISTORY: No smoking. No EtOH use. No drug use. Res[facility], which is an assisted living facility.   FAMILY HISTORY: The patient's mother died of complication of stroke. The patient's father deceased for unknown reasons.   REVIEW OF SYSTEMS: Difficulty to obtain. The patient apparently has generalized weakness. No chest pain. No shortness of breath. No difficulty breathing. No weakness on one side compared to the other. No anxiety. No depression.   LABORATORY DATA: Work-up has been reviewed. Sodium is134.   PHYSICAL EXAMINATION:  VITAL SIGNS: Include a temperature of 98.4, blood pressure 150/70, pulse oximetry at 100%.  NEUROLOGIC: The patient could not tell me the date, time or the current location. Able to tell me her name. She appears to be hard of  hearing. Extraocular movements are intact. Pupils reactive, round bilaterally. Facial sensation intact. Facial motor intact. Generalized weakness 4+/5 bilateral upper and lower extremities. Reflexes are diminished. Sensation intact to light touch and temperature. Coordination intact. Gait not assessed   IMPRESSION: A 81 year old female with multiple falls, presenting with what appears to polydipsia with hyponatremia status post 3% sodium with improvement in sodium. She is status post code, found to have altered mental status, found to have seizure activity, status post benzo, status post load of Dilantin, started Dilantin 100 q.8. Currently close to baseline as per daughter, suspected cause of seizures likely multifactorial and includes chronic benzo use, which was discontinued, as well as urinary tract infection and hyponatremia which resolved. The patient is currently on Dilantin.   PLAN: Continue Dilantin either until the patient is discharged or for a total of seven days, would not continue chronic Dilantin therapy as I believe the seizure was provoked by multiple factors as that I described above. I do not think the patient needs an EEG or MRI at this point, which will be discontinued. This case was discussed with the patient and the patient's daughter who is at bedside. Please call with any questions.  Thank you, it was a pleasure seeing this patient.   ____________________________ Pauletta BrownsYuriy Aymar Whitfill, MD yz:sg D: 06/01/2013 12:05:24 ET T: 06/01/2013 13:03:48 ET JOB#: [id number]  cc: Pauletta BrownsYuriy Charlyn Vialpando, MD, <Dictator> Pauletta BrownsYURIY Tuwanna Krausz MD ELECTRONICALLY SIGNED 06/01/2013 18:48

## 2014-04-24 NOTE — Consult Note (Signed)
Brief Consult Note: Diagnosis: decubitius ulcer left heel.   Patient was seen by consultant.   Consult note dictated.   Comments: Continue silvadene and start wet to dry well padded gauze dressing to left heel daily.  Continue heel protector boots.  Needs to come in to my office within the next 2 weeks for debridement of wound.  Electronic Signatures: Epimenio Sarinroxler, Kesler Wickham G (MD)  (Signed 09-Sep-15 12:57)  Authored: Brief Consult Note   Last Updated: 09-Sep-15 12:57 by Epimenio Sarinroxler, Dennisha Mouser G (MD)

## 2014-04-24 NOTE — Discharge Summary (Signed)
PATIENT NAME:  Gina [name], Gina [name] MR#:  [id number] DATE OF BIRTH:  28-Jun-1917  DATE OF ADMISSION:  06/27/2013 DATE OF DISCHARGE:  06/30/2013  PRESENTING COMPLAINT:  Altered mental status.  DISCHARGE DIAGNOSES: 1.  Recurrent hyponatremia, acute suspected syndrome of inappropriate antidiuretic hormone secretion versus excess water intake. 2.  Asymptomatic urinary tract infection. 3.  Hypertension. 4.  Type 2 diabetes.  CODE STATUS:  NO CODE, DNR.  MEDICATIONS: 1.  Omeprazole 20 mg daily. 2.  Metformin 500 p.o. daily. 3.  Deep Sea nasal spray, 2 sprays every 4 hours while awake. 4.  Levothyroxine 112 mcg p.o. daily. 5.  Metoprolol ER 25 mg daily. 6.  Vitamin C 500 mg daily. 7.  Ranitidine 300 mg daily. 8.  Trazodone 50 mg daily at bedtime. 9.  Alprazolam 0.5 mg b.i.d. 10.  Verapamil extended release 240 mg p.o. daily. 11.  Nystatin triamcinolone apply to affected area b.i.d. 10.  Alprazolam 0.5 mg 2 tablets once daily at bedtime.  DISCHARGE THERAPY:  Home health, physical therapy.  DISCHARGE DIET: Restricted fluids to 1500 mL a day.  DISCHARGE FOLLOWUP:  1.  Followup with Dr. Thedore MinsSingh or Dr. Cherylann RatelLateef in 1 to 2 weeks. 2.  Followup with Dr. Marguerite OleaMoffett.   CONSULTATION:  Dr. Thedore MinsSingh, nephrology.  BRIEF SUMMARY OF HOSPITAL COURSE:  Caroll RancherLucille Fleer is a very pleasant [age] year old Caucasian female with history of diabetes, hypertension comes in with;  1.  Altered mental status, likely due to UTI and hyponatremia. CT head does not show any acute findings. Mentation is stable and appears pleasant discharge. 2.  Severe hyponatremia. Questionable history of SIADH versus excessive water intake. The patient was started on IV 3% hypertonic saline. Nephrology consultation was obtained. She came in with sodium of 113. On discharge, her sodium was 128.  Patient was restricted on p.o. fluids and she will followup with nephrology as outpatient. 3.  Hypertension. Continued her home meds. 4.  Type 2  diabetes. Resumed p.o. metformin. 5.  Physical therapy recommended home PT which has been set up.  TIME SPENT: 40 minutes.     ____________________________ Velva HarmanMohammed A. Clydie BraunBhatti, MD mab:aj D: 07/03/2013 13:14:34 ET T: 07/04/2013 00:28:19 ET JOB#: [id number]  cc: Mohammed A. Clydie BraunBhatti, MD, <Dictator> Durward MallardJoel B. Marguerite OleaMoffett, MD Mosetta PigeonHarmeet Singh, MD

## 2014-04-24 NOTE — H&P (Signed)
PATIENT NAME:  Gina [name], Gina [name] MR#:  [id number] DATE OF BIRTH:  04-Dec-1917  DATE OF ADMISSION:  09/08/2013  PRIMARY CARE PHYSICIAN: Francis Dowse B. Moffett, MD  CHIEF COMPLAINT: Weakness and hyponatremia.   HISTORY OF PRESENT ILLNESS: This is a 81 year old female who presents from [facility] assisted living facility due to profound weakness and altered mental status. As per the daughters, who give most of the history, the patient is usually able to get up and ambulate a little bit with her walker, but for the past few days, since this past Friday, the patient has not been able to get out of bed and use her walker to ambulate. She has been more lethargic and confused. They took her to her primary care physician's office, Dr. Marguerite Olea, who then sent her to the ER for further evaluation. The patient has had similar episodes like this where she was noted to have a UTI and was noted to be hyponatremic. When she presented to the ER, she was noted to be hyponatremic and also noted to have a UTI. Hospitalist services were contacted for further treatment and evaluation.   REVIEW OF SYSTEMS:  CONSTITUTIONAL: No documented fever. Positive fatigue and weakness. No weight gain, no weight loss.  EYES: No blurry or double vision.  ENT: No tinnitus. No postnasal drip. No redness of the oropharynx.  RESPIRATORY: No cough, no wheeze, no hemoptysis, no dyspnea.  CARDIOVASCULAR: No chest pain, no orthopnea, no palpitations, no syncope.  GASTROINTESTINAL: No nausea, no vomiting, no diarrhea, no abdominal pain. No melena or hematochezia.  GENITOURINARY: No dysuria, no hematuria.  ENDOCRINE: No polyuria, nocturia, heat or cold intolerance.  HEMATOLOGIC: No anemia, no bruising, no bleeding.  INTEGUMENTARY: No rashes, no lesions.  MUSCULOSKELETAL: No arthritis, no swelling, no gout.  NEUROLOGIC: No numbness, no tingling. No ataxia. No seizure-type activity.  PSYCHIATRIC: Positive anxiety. No insomnia. No ADD.   PAST  MEDICAL HISTORY: Consistent with:  1. Diabetes.  2. Hypertension.  3. Hypothyroidism.  4. Dementia.  5. History of recurrent hyponatremia.  6. GERD.  7. Anxiety.  8. History of benzodiazepine withdrawal seizures.   ALLERGIES: FOSAMAX.   SOCIAL HISTORY: No smoking. No alcohol abuse. No illicit drug abuse. Resides at the [facility] assisted living facility.   FAMILY HISTORY: The patient's mother died from a CVA. Father died from old age.   CURRENT MEDICATIONS: As follows:  1. Xanax 0.5 mg b.i.d. 2. Xanax 1 mg at bedtime.  3. Synthroid 112 mcg daily.  4. Metformin 500 mg daily.  5. Toprol 25 mg daily.  6. Nitrofurantoin 100 mg daily.  7. Nystatin/triamcinolone topical cream to be applied to the affected area b.i.d. 8. Omeprazole 20 mg daily.  9. Ranitidine 300 mg at bedtime. 10. Sea Soft 0.65% nasal spray 2 sprays 4 times daily. 11. Silvadene 1% topical cream to be applied to the left heel and bottom once a day for 2 weeks. 12. Trazodone 50 mg at bedtime.  13. Verapamil 240 mg daily. 14. Vitamin C 500 mg daily.   PHYSICAL EXAMINATION: Presently is as follows:  VITAL SIGNS: Noted to be: Temperature 98.6, pulse 46, respirations 18, blood pressure 135/63, saturation is 94% on room air.  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  HEAD, EYES, EARS, NOSE AND THROAT: Atraumatic, normocephalic. Extraocular muscles are intact. Pupils reactive to light. Sclerae anicteric. No conjunctival injection. No pharyngeal erythema. Dry oral mucosa.  NECK: Supple. There is no jugular venous distention. No bruits, no  lymphadenopathy, no thyromegaly.  HEART: Regular rate and rhythm. No murmurs, no rubs, no clicks.  LUNGS: Clear to auscultation bilaterally. Poor respiratory effort. No rales or rhonchi. No wheezes.  ABDOMEN: Soft, flat, nontender, nondistended. Has good bowel sounds. No hepatosplenomegaly appreciated.  EXTREMITIES: No evidence of any cyanosis,  clubbing or peripheral edema. Has +2 pedal and radial pulses bilaterally.  NEUROLOGICAL: The patient is alert, awake and oriented x1, globally weak, moves all extremities spontaneously. No other focal motor or sensory deficits bilaterally.  SKIN: Moist and warm. She does have a left heel and a sacral stage I to II ulcer.  LYMPHATIC: There is no cervical or axillary lymphadenopathy.   LABORATORY EXAM: Showed a serum glucose of 325, BUN 16, creatinine 1.37, sodium 125, potassium 5.2, chloride 94, bicarbonate 22, ALT is 66, AST is 36, albumin 2.5. White cell count 10.6, hemoglobin 10.8, hematocrit 32.9, platelet count 216. Urinalysis was positive for UTI. The patient also had a chest x-ray done which showed no acute abnormalities.   ASSESSMENT AND PLAN: This is a 81 year old female with history of recurrent hyponatremia, dementia, hypertension, hypothyroidism, gastroesophageal reflux disease, anxiety, diabetes, history of benzodiazepine withdrawal seizures, who presents to the hospital due to altered mental status, weakness and noted to have a urinary tract infection and also noted to be hyponatremic.   1. Altered mental status. This is likely metabolic encephalopathy from the urinary tract infection and hyponatremia. I will treat the urinary tract infection with IV ceftriaxone, follow urine cultures and also give IV fluids for the hyponatremia and follow her mental status closely.  2. Hyponatremia. This is likely acute on chronic hyponatremia. At present, I think it is hypovolemic hypotonic hyponatremia. The patient does have a history of syndrome of inappropriate antidiuretic hormone. I will place the patient on gentle IV fluids, follow the sodium. Will get a nephrology consult. Will check a urine and serum osmolality. Will also check a urine sodium level.  3. Urinary tract infection. The patient recently finished treatment with Bactrim. Urine grew out E. coli sensitive to ceftriaxone. I will give the  patient IV ceftriaxone.  4. Acute renal failure. This is likely prerenal in nature related to dehydration and possible use of Bactrim for the urinary tract infection. I will give the patient gentle IV fluids, follow BUN and creatinine, renal dose medications. Avoid nephrotoxins.  5. Bradycardia. The patient's heart rate is in the 40s. No evidence of any acute atrioventricular block. I will hold her verapamil and Toprol. I will keep her on off-unit telemetry, follow heart rate. 6. Diabetes. Hold metformin for now. Place on sliding scale insulin. 7. Anxiety. Continue her Xanax. 8. Gastroesophageal reflux disease. Continue Protonix.  9. Hypothyroidism. Continue Synthroid.   CODE STATUS: The patient is a DNI/DNR.   TIME SPENT ON ADMISSION: 50 minutes.   ____________________________ Rolly PancakeVivek J. Cherlynn KaiserSainani, MD vjs:lb D: 09/08/2013 13:25:43 ET T: 09/08/2013 13:39:09 ET JOB#: [id number]  cc: Rolly PancakeVivek J. Cherlynn KaiserSainani, MD, <Dictator> Houston SirenVIVEK J SAINANI MD ELECTRONICALLY SIGNED 09/09/2013 16:00

## 2014-04-24 NOTE — H&P (Signed)
PATIENT NAME:  Gina [name], Gina [name] MR#:  [id number] DATE OF BIRTH:  Feb 23, 1917  DATE OF ADMISSION:  06/27/2013  PRIMARY CARE PHYSICIAN: Dr. Marguerite Olea.   CHIEF COMPLAINT: Multiple falls.   HISTORY OF PRESENT ILLNESS: This is a 81 year old female with known history of diabetes and hypertension. Patient had a recent hospitalization, admitted on  May 27, due to multiple falls. Patient was found to have hyponatremia at 115, which was evaluated by nephrology service and thought to be most likely having SIADH. Patient was on hypertonic saline where on discharge hyponatremia has been corrected at 132. As well, her hospitalization was noted by seizure secondary to diazepam withdrawal, as she was not on her diazepam during  her hospitalization secondary to her altered mental status. The patient had UTI then which did grow VRE, but it did only go 1000 colonies then. The patient presents due to multiple falls with head trauma today. CT head and CT cervical spine did not show any acute findings, but patient was found to have hyponatremia of 113. As well, she was found to have UTI as well.  Daughter  at bedside reports her mom seems to be more confused than hers baseline.   PAST MEDICAL HISTORY:  1.  Hypertension.  2.  Diabetes.  3.  Recent hospitalization significant for UTI, hyponatremia due to SIADH, and diazepam withdrawal seizures.   ALLERGIES: FOSAMAX.   SOCIAL HISTORY: No smoking. No alcohol. No illicit drug use. The patient lives in assisted living facility.   FAMILY HISTORY: Significant for CVA.   REVIEW OF SYSTEMS: Very difficult to obtain from this patient as she is confused, and cannot obtain a reliable review of systems from her.   HOME MEDICATIONS:  1.  Verapamil extended release 240 mg oral daily.  3.  Metformin 500 mg oral daily.  4.  Alprazolam 0.5 mg 2 times a day at 7:00 a.m. and 4:00 p.m.  5.  Alprazolam 1 mg oral daily at bedtime.  6.  Metoprolol succinate extended release 25 mg oral  daily.  7.  Ranitidine 300 mg oral at bedtime.  8.  Nasal spray 2 sprays every 4 hours as needed.  9.  Omeprazole 20 mg extended release oral daily. 10.  Levothyroxine 112 mcg oral daily.  11.  Vitamin C 500 mg oral daily.   PHYSICAL EXAMINATION:  VITAL SIGNS: Temperature 98.2, pulse 76, respiratory rate 22, blood pressure 152/70, saturating 98% on room air.  GENERAL: Frail, elderly female, looks comfortable in bed, in no apparent distress.  HEENT: Head atraumatic, normocephalic. Pupils equal and reactive to light. Pink conjunctivae. Anicteric sclerae. Dry oral mucosa.  NECK: Supple. No thyromegaly. No JVD.  CHEST: Good air entry bilaterally. No wheezing, rales, rhonchi.  CARDIOVASCULAR: S1, S2 heard. No rubs, murmur or gallops.  ABDOMEN: Soft, nontender, nondistended. Bowel sounds present.  EXTREMITIES: No edema. No clubbing. No cyanosis.  SKIN: Warm and dry. Red skin turgor. The patient had scalp hematoma at the back of her head, very minimal, no bleeding.  PSYCHIATRIC: The patient is awake, alert, communicative, but confused. Only alert to name.  NEUROLOGIC: Cranial nerves grossly intact.  MOTOR: No focal deficits noticed.  MUSCULOSKELETAL: No joint effusion or erythema.   PERTINENT LABORATORY DATA: Glucose 271, BUN 18, creatinine 0.71, sodium 113, potassium 4.6, chloride 81, CO2 27. White blood cells 11.7,  hemoglobin 11.7, hematocrit 34.2, platelets 192,000. Urinalysis +3 leukocyte esterase, 76 white blood cells,  and +2 bacteria.   IMAGING STUDIES: CT head without contrast and CT  of cervical spine without contrast showing diffuse cortical atrophy, mild chronic ischemic white matter disease. No acute intracranial abnormality seen and no acute abnormality seen in the cervical spine.   ASSESSMENT AND PLAN:  1.  Hypernatremic. The patient presents with severe hyponatremia, as well as altered mental status. Related to her hyponatremia most likely. Patient has questionable history of SIADH  in the past. She will be admitted to Intensive Care Unit. She will be started on hypertonic saline 40 mL/hour. We will monitor her saline frequently. Requested from surgery to place a central line for her hypertonic saline. Discussed with nephrology, on call as well. We will obtain urine osmolality and urine sodium as well.   2.  Urinary tract infection. The patient will be started on Rocephin, even though her most recent  urine culture last month growing VRE, but it was 1000 colonies.  So we will hold on starting linezolid pending care and current blood cultures.   3.  Altered mental status. This is most likely related to his urinary tract infection and hyponatremia. CT head does not show any acute findings.  We will monitor closely.   4.  Hypertension. Blood pressure acceptable. Continue with home medications.  5.  Diabetes mellitus. Hold oral hypoglycemic agents. We will keep her on insulin sliding scale.   6.  Deep vein thrombosis prophylaxis on subcutaneous heparin.   7.  Gastrointestinal prophylaxis on Zantac.  8.  CODE STATUS: The patient is DO NOT RESUSCITATE. Discussed with her daughter at bedside.  TIME SPENT ON ADMISSION AND PATIENT CARE: 55 minutes.    ____________________________ Dawood S. Elgergawy, MD dse:tStarleen Armss D: 06/27/2013 03:56:29 ET T: 06/27/2013 11:00:22 ET JOB#: [id number]  cc: Starleen Armsawood S. Elgergawy, MD, <Dictator> DAWOOD Teena IraniS ELGERGAWY MD ELECTRONICALLY SIGNED 06/28/2013 23:28

## 2014-04-24 NOTE — Op Note (Signed)
PATIENT NAME:  Gina [name], [name] MR#:  [id number] DATE OF BIRTH:  [birth year]  DATE OF PROCEDURE:  06/27/2013  PREOPERATIVE DIAGNOSIS: Hyponatremia, need for central access.   PREOPERATIVE DIAGNOSIS: Hyponatremia, need for central access.  PROCEDURE PERFORMED: Ultrasound-guided, right internal jugular central venous catheter placement.   ESTIMATED BLOOD LOSS: 5 mL.   COMPLICATIONS: None.   SPECIMENS: None.   ANESTHESIA: 1% lidocaine with epinephrine.   INDICATION FOR SURGERY: The patient is a pleasant, 81 year old female with history of falls, who presents with hyponatremia, possibly from SIADH. I was consulted for central venous catheter placement.   DETAILS OF PROCEDURE: Informed consent was obtained. The patient was laid supine on her stretcher in slight reverse Trendelenburg. The ultrasound was placed over her neck, and a large, dilated but collapsible with respirations, internal jugular vein was isolated. I then prepped and draped her neck in a standard surgical fashion. I then placed ultrasound over her vein, and was able to access it with one stick. I initially had some intermittent difficulty threading the wire due to the fact that the patient was extremely dehydrated and the vein collapsed with deep inhalation, but I was able to place a wire through the needle. The needle was withdrawn. The tract was dilated and flushed. The flushed triple-lumen catheter was placed over the wire. The wire was withdrawn. The catheter was sutured in place. All three ports flushed and drew easily. The catheter was then sutured in place in five spots. Postprocedure chest x-ray showed the catheter in appropriate position without pneumothorax. There were no immediate complications. Needle, sponge, and instrument counts were correct at the end of the procedure.     ____________________________ Si Raiderhristopher A. Lundquist, MD cal:cg D: 06/27/2013 19:12:53 ET T: 06/28/2013 07:40:53  ET JOB#: [id number]  cc: Cristal Deerhristopher A. Lundquist, MD, <Dictator> Jarvis NewcomerHRISTOPHER A LUNDQUIST MD ELECTRONICALLY SIGNED 07/06/2013 11:50

## 2014-04-24 NOTE — Discharge Summary (Signed)
PATIENT NAME:  Gina [name], Gina [name] DATE OF BIRTH:  07/25/1917  DATE OF ADMISSION:  05/27/2013 DATE OF DISCHARGE:  06/02/2013  ADMITTING DIAGNOSIS: Hyponatremia, altered mental status, weakness, questionable urinary tract infection, lower extremity swelling, malignant essential hypertension.   DISCHARGE DIAGNOSES: 1.  Hyponatremia.  2.  Suspected syndrome of inappropriate antidiuretic hormone secretion, now on p.o. fluid restriction.  3.  Altered mental status, resolved, likely hyponatremia related.  4.  Seizures due to Ativan withdrawal, resolved.  5.  Vancomycin-resistant enterococci, 1000 colonies in urine.  6.  Malignant essential hypertension.  7.  Diabetes mellitus.  8.  History of frequent urinary tract infections.   DISCHARGE CONDITION:  Stable.   DISCHARGE MEDICATIONS:  The patient is to continue:  1.  Omeprazole 10 mg p.o. daily.  2.  Metformin 500 mg p.o. daily.  3.  Deep  Sea Nasal Spray 2 sprays to nasal cavities every 4 hours while awake; at 7:00 a.m., 11:00 a.m., 3:00 p.m., as well as 7:00 p.m.  4.  Levothyroxine 112 mcg p.o. daily.  5.  Metoprolol succinate ER 25 mg p.o. daily.  6.  Vitamin C 500 mg p.o. daily.  7.  Ranitidine 300 mg p.o. at bedtime.  8.  Trazodone 50 mg p.o. at bedtime.  9.  Alprazolam 0.5 mg 2 tablets, which would be 1 mg at bedtime.  10.  Nystatin topical cream applied to groin and buttocks twice daily at 7:00 a.m. as well as 8:00 p.m.    11.  Endit ointment applied to effected area twice daily as needed for rash.  12.  Verapamil extended release 240 mg p.o. daily.  13.  Alprazolam 0.5 mg twice daily at 7:00 a.m., as well as 4:00 p.m.  14.  Dilantin 200 mg p.o. daily for 7 days.  15.  Aspirin 81 mg per day.  16.  Senna 1 tablet twice daily as needed.  17.  Docusate sodium 100 mg p.o. twice daily as needed.  18.  Lisinopril 5 mg p.o. daily.   HOME OXYGEN:  None.   DIET: Two-gram salt, low fat, low-cholesterol, carbohydrate  controlled, also 1500 mL fluid restriction, mechanical soft consistency.   ACTIVITY LIMITATIONS: As tolerated.   Physical therapy was advised for patient in rehab.   FOLLOWUP APPOINTMENT: With Dr. Marguerite OleaMoffett in 2 days after discharge. The patient was also advised to have her sodium level checked, as well as kidney function tests to be checked frequently, possibly every 3 or 4 days initially, making sure that the patient does not get dehydrated and her sodium level remains stable.     CONSULTANTS:  Care management, social work, Dr. Loretha BrasilZeylikman, neurology; Dr. Thedore MinsSingh, nephrology; Dr. Mady HaagensenMunsoor Lateef, nephrology.   RADIOLOGIC STUDIES:  CT scan of head without contrast on 05/27/2013, showed negative for bleed or acute intracranial process. Chest, portable single view, 05/29/2013, revealed left PICC line placement, tip in the mid SVC, cardiomegaly, clear lungs, otherwise, unremarkable. CT scan of the chest with contrast, 05/29/2013, revealed no lung mass, scattered subsegmental atelectasis and cirrhotic liver.   The patient is a 81 year old Caucasian female with past medical history significant for history of frequent urinary tract infections, also history of falls and weakness, admission for the same, diabetes mellitus, hypertension, who presented to the hospital after falls in the facility. Apparently, she fell down 3 times in the facility and was brought to the hospital for further evaluation. In the Emergency Room, she was noted to have sodium level of 115 and hospitalist  services were contacted for admission. Apparently, the patient has been drinking fluids, water, nonstop since July 2014, since she was told to be  drinking plenty of fluids to prevent renal failure. She was noted to have some lower extremity swelling. Apparently, the patient was on diuretics in the past; however, this medication was stopped due to acute renal failure and dehydration in July 2014.  On arrival to the hospital, the patient was  confused. Her temperature  was 98.5, pulse was 65, respiration rate was 18, blood pressure 214/110, pulse oximetry was 98% on room air. Physical exam revealed lower extremity swelling, otherwise unremarkable. The patient's lab data done on arrival to the Emergency Room, 05/27/2013, showed elevated glucose to 128, BUN of 21, sodium 115; otherwise,  BMP was unremarkable. The patient's liver enzymes revealed albumin level of 3.1. Hemoglobin A1c was checked and was found to be 7.8. The patient's troponin was less than 0.02. TSH was 2.3. The patient's white blood cell count was 11.7, hemoglobin was 12.0, platelet count 155. The patient's urine osmolality was 745 with low serum osmolarity of 238 only. 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 The patient's blood cultures taken on 05/27/2013, showed no growth. The patient's urinalysis revealed 41 white blood cells, 1 red blood cells, 2+ leukocyte esterase, 1+ blood. Urine cultures were taken and they grew only 1000 colony-forming units of VRE, as well as aerobic gram-positive rod with no further ID, also 1000 colony-forming units. The patient was given 2 doses of Zyvox, but otherwise was not treated for this infection.  However, initially, she was initiated on Rocephin, which was later stopped. In regards to hyponatremia, the patient received IV fluids with no improvement of her sodium levels. Later on, the patient was started on 3% sodium infusion, with which the patient's sodium level normalized.  Unfortunately, her mental status did not improve. During admission, benzodiazepine and Ativan was stopped due to concerns of confusion related to benzodiazepine; however, the patient developed seizures on 05/31/2013. a few episodes of seizures happened'; however, those were felt to be multifactorial, likely due to benzodiazepine withdrawal, as well as possibly hyponatremia. The patient was initiated on Dilantin and her Dilantin level was followed. The patient was seen  by neurologist, Dr. Loretha Brasil, who felt that the patient's seizure episode was very likely due to chronic benzodiazepine use, which was discontinued, as well as possibly questionable urinary tract infection and hyponatremia. He recommended to continue Dilantin until the patient is discharged for a total of approximately 7 days, and he recommended not to continue chronic Dilantin therapy because he believed that the patient's seizure was provoked by multiple factors. He recommended not to get an electroencephalogram or MRI of the brain. MRI of the brain was discontinued; however, an electroencephalogram was performed on 06/02/2013, and was read by Dr. Mellody Drown. Dr. Mellody Drown read the electroencephalogram as generalized intermittent slowing, which was a nonspecific finding that can be seen in mild encephalopathy or medication effect of sedation or even neurodegenerative process such as dementia. There was no evidence of seizures in the recording; however, the lack of seizure activity did not rule out seizures, and further clinical correlation was suggested. However, since the patient's electroencephalogram did not show any significant changes, the patient is to continue Dilantin only for 7 days and then discontinue. In regards to hyponatremia, as mentioned above, the patient's sodium level normalized and her 3% sodium solution was discontinued. The patient's sodium level went up to 134 on the 05/31/2013;  however, after discontinuation of 3% sodium solution, the patient's sodium level went down to 132 on 06/02/2013. It was felt that the patient should have some fluid restriction, since there was concern that the patient's hyponatremia could have been related to significant fluid overload with nonstop water drinking, as well as possibly SIADH, since the patient's urine osmolality was markedly elevated at 745. The patient is initiated on 1500 mL fluid restriction. It is recommended, however, to follow the  patient's sodium level, as well as her BUN and creatinine to make sure that she is not getting dehydrated and her sodium level remains stable. It is recommended to follow the patient's sodium levels, as well as kidney function tests frequently, possibly even every 3 to 4 days initially and then periodically. In regards to seizure disorder, as mentioned above, the patient's seizures were felt to be due to Ativan withdrawal. We are resuming her Ativan. She is to continue those medications chronically. No changes will be made. In regard to VRE in urine, the patient received 2 doses of Zyvox; however, no further therapy was initiated due to no symptomatology. In regards to malignant essential hypertension, the patient is to continue metoprolol, as well as verapamil and lisinopril, 5 mg dose was added. On the day of discharge, 06/02/2013, the patient's vital signs revealed better blood pressure control; however, the patient's blood pressure is still fluctuating between 120s to 150s to 160s systolic. It is recommended to advance the patient's lisinopril following her kidney function tests frequently or add different medications if her kidney function test worsens with those medications. In regards to diabetes mellitus, her hemoglobin A1c was checked and was found to be 7.8. The patient was advised to continue low fat, low cholesterol diet and possibly to be initiated on diabetic medications other than metformin. The patient has been metformin at 500 mg daily dose. The patient may benefit from advancement of her metformin dose overall, since that may lead her to significant fluid loss and hyponatremia. Regarding generalized weakness, the patient had a physical therapist evaluation, who felt that the patient would benefit from rehabilitation placement. The patient is being discharged to rehabilitation facility today, on 06/02/2013.  On the day of discharge, the patient felt satisfactory. Did not complain of any significant  discomfort. Her vitals were stable with temperature of 98.3, pulse was 69 to 71, respiratory rate was 19 to 20, blood pressure systolic ranging from 122 to 154 and 60s to 70s diastolic. O2 sats were 99% on room air at rest.   TIME SPENT: 40 minutes.    ____________________________ Katharina Caper, MD rv:dmm D: 06/02/2013 11:45:00 ET T: 06/02/2013 12:31:42 ET JOB#: [id number]  cc: Katharina Caper, MD, <Dictator> Durward Mallard. Marguerite Olea, MD Katharina Caper MD ELECTRONICALLY SIGNED 06/16/2013 7:31

## 2014-04-24 NOTE — Discharge Summary (Signed)
PATIENT NAME:  Gina [name], Gina [name] MR#:  [id number] DATE OF BIRTH:  12-11-1917  DATE OF ADMISSION:  09/08/2013 DATE OF DISCHARGE:  09/10/2013  PRIMARY CARE PHYSICIAN: Wonda ChengJoel Moffett, MD  FINAL DIAGNOSES: 1.  Acute encephalopathy.  2.  Hyponatremia likely hypovolemic syndrome of inappropriate antidiuretic hormone. 3.  Urinary tract infection.  4.  Acute renal failure, on chronic kidney disease.  5.  Left heel decubiti. 6.  Sacral decubiti.   DISCHARGE MEDICATIONS: Include omeprazole 20 mg daily, levothyroxine 112 mcg daily, vitamin C 500 mg daily, ranitidine 300 mg at bedtime, trazodone 50 mg at bedtime, nystatin triamcinolone 100,000 units/gram 0.1% topical cream applied to effected area twice a day, verapamil extended release 240 mg once a day, sea soft 0.65 nasal spray 2 sprays nasally 4 times a day, Xanax 0.5 mg 1 tablet twice a day in the morning and afternoon, Xanax 0.5 mg 2 tablets at bedtime, Silvadene topical cream applied to effected area on bottom once a day for 2 weeks, NovoLog sliding scale, cephalexin 250 mg 3 times a day for 4 days, sodium chloride 1000 mg 1 tablet every 12 hours.   Stop metformin, metoprolol and nitrofurantoin now. Can restart nitrofurantoin 100 mg p.o. nightly after Keflex course is finished. I do recommend checking a BMP 1 day prior to appointment with Dr. Cherylann RatelLateef and results to go to Dr. Cherylann RatelLateef. Check a BMP weekly.   REASON FOR ADMISSION: The patient was admitted September 08, 2013 and discharged September 10, 2013. Came in with weakness, hyponatremia and altered mental status. The patient was started on IV Rocephin and given IV fluids for the hyponatremia. Nephrology consultation was ordered. For acute on chronic renal failure, could be secondary to the Bactrim that was used for the UTI. The patient had bradycardia initially. The patient was held on both the verapamil and Toprol initially. For the diabetes, metformin was were held, sliding scale started.    DIAGNOSTIC DATA: Chest x-ray was negative.   Troponin negative. Glucose 325, BUN 16, creatinine 1.37, sodium 125, potassium 5.2, chloride 94, CO2 of 22, calcium 9.3, alkaline phosphatase 203, ALT 66, AST 36. White blood cell count 10.6, [name] and [name] 10.8 and 32.9, platelet count 216,000. Urinalysis was 1+ bacteria, 1+ leukocyte esterase and nitrites were negative. Urine random osmolality 344. Urine random sodium 55.   EKG showed sinus bradycardia, first-degree AV block, left axis deviation.   Repeat sodium 127, another repeat sodium 128. Urine culture sent on the 9th showed contamination. Creatinine upon discharge 1.05, sodium 128.   HOSPITAL COURSE PER PROBLEM LIST:  1.  For the patient's acute encephalopathy, that had improved. The patient is talking and answering some questions.  2.  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 The nephrologist followed this patient. They added salt tabs. IV fluids were given while here. Fluid restriction. Follow up with weekly BMPs and Dr. Cherylann RatelLateef in 1 week.  3.  UTI. Unfortunately urine culture was not sent off on admission. Rocephin was started and given for the entire hospital course. Urine culture grew out contamination. I will just give a few more days of Keflex and then can go back on the preventative nitrofurantoin.  4.  Acute renal failure, on chronic kidney disease. This had improved.  5.  Left heel decubiti. I did consult Dr. Orland Jarredroxler who recommended wet to dry dressing and followup in the office for debridement in 7 to 10 days. Heel protectors while in bed.  6.  Sacral decubiti. Do recommend covering. The patient is also [facility]  [facility].  7.  Hypothyroidism. On levothyroxine.  8.  GERD. On omeprazole.  9.  Anxiety. On Xanax.  10.  Diabetes. On sliding scale. Metformin stopped secondary to age and creatinine initially on presentation.  TIME SPENT ON DISCHARGE: 40 minutes.   ____________________________ Herschell Dimes. Renae Gloss,  MD rjw:sb D: 09/10/2013 12:10:54 ET T: 09/10/2013 12:21:03 ET JOB#: [id number]  cc: Herschell Dimes. Renae Gloss, MD, <Dictator> Durward Mallard. Marguerite Olea, MD Rehab facility  Salley Scarlet MD ELECTRONICALLY SIGNED 09/15/2013 12:27

## 2014-04-24 NOTE — Consult Note (Signed)
PATIENT NAME:  Gina [name], Gina [name] MR#:  [id number] DATE OF BIRTH:  03/03/1917  DATE OF CONSULTATION:  09/09/2013  CONSULTING PHYSICIAN:  Rhona RaiderMatthew G. Braidan Ricciardi, DPM  REASON FOR CONSULTATION: Decubitus ulceration, left heel.   HISTORY OF PRESENT ILLNESS: The patient was admitted to the hospital recently for confusion, hyponatremia and profound weakness. We felt she had likely a urinary tract infection and was contributing to some of these issues. On examination, it was discovered she had a decubitus ulceration on her left heel. She also has one on her sacrum.   PAST MEDICAL HISTORY: Includes diabetes, hypertension, hypothyroidism, dementia, GERD, anxiety, history of benzodiazepine withdrawal seizures, history of chronic UTIs with confusion and hyponatremia.   SOCIAL HISTORY: Denies smoking. Denies ETOH. Denies illicit drug use. [facility]    CURRENT MEDICATIONS: Include Xanax, Synthroid, metformin 500 mg daily, Toprol, nitrofurantoin, nystatin topical cream, omeprazole, ranitidine, Silvadene cream, trazodone, verapamil and vitamin C.   REVIEW OF SYSTEMS: Shows a 81 year old female, responds to simple questions with simple answers, but does not elaborate any at all.   PHYSICAL EXAMINATION:   VITAL SIGNS:  Blood pressure is 139/72, temperature 98.2, pulse 93, respirations 20.  LOWER EXTREMITIES: Vascular: DP pulses are diminished bilaterally but palpable. DERMATOLOGIC: The patient has a decubitus ulcer on the left heel that is about 3 cm in diameter, appears to be fairly superficial, no worse than the grade 1 lesion. Has some hemorrhagic skin, which is somewhat dried on the posterior heel, consistent with this decubitus ulceration.   CLINICAL IMPRESSION: Diabetic decubitus left heel ulcer, noninfected, no cellulitis. No drainage to the area.   TREATMENT PLAN: Continue with offloading the region with the boots that she has on now in the  hospital. Start a wet to dry saline dressing to soften and help remove the hyperkeratotic and devitalized tissues from the area.  I suggested she come into the office in the next week or 2 for me to further debride this. No evidence of infection to the area, so I think she is not in need for antibiotics for her heel.    ____________________________ Rhona RaiderMatthew G. Jorje Vanatta, DPM mgt:lr D: 09/09/2013 12:55:11 ET T: 09/09/2013 13:38:13 ET JOB#: [id number]  cc: Rhona RaiderMatthew G. Pinkney Venard, DPM, <Dictator> Epimenio SarinMATTHEW G Evagelia Knack MD ELECTRONICALLY SIGNED 10/20/2013 10:05

## 2014-04-24 NOTE — Discharge Summary (Signed)
PATIENT NAME:  Gina [name], Bernyce H MR#:  [id number] DATE OF BIRTH:  [birth year]  DATE OF ADMISSION:  06/27/2013 DATE OF DISCHARGE:  06/30/2013  PRESENTING COMPLAINT:  Altered mental status.  DISCHARGE DIAGNOSES: 1. Recurrent hyponatremia, acute suspected syndrome of inappropriate antidiuretic hormone secretion versus excess water intake. 2. Asymptomatic urinary tract infection. 3. Hypertension. 4. Type 2 diabetes.  CODE STATUS:  NO CODE, DNR.  MEDICATIONS: 1. Omeprazole 20 mg daily. 2. Metformin 500 p.o. daily. 3. Deep Sea nasal spray, 2 sprays every 4 hours while awake. 4. Levothyroxine 112 mcg p.o. daily. 5. Metoprolol ER 25 mg daily. 6. Vitamin C 500 mg daily. 7. Ranitidine 300 mg daily. 8. Trazodone 50 mg daily at bedtime. 9. Alprazolam 0.5 mg b.i.d. 10. Verapamil extended release 240 mg p.o. daily. 11. Nystatin triamcinolone apply to affected area b.i.d. 1. 10. Alprazolam 0.5 mg 2 tablets once daily at bedtime.  DISCHARGE THERAPY:  Home health, physical therapy.  DISCHARGE DIET: Restricted fluids to 1500 mL a day.  DISCHARGE FOLLOWUP:  1.  Followup with Dr. Thedore MinsSingh or Dr. Cherylann RatelLateef in 1 to 2 weeks. 2.  Followup with Dr. Marguerite OleaMoffett.   CONSULTATION:  Dr. Thedore MinsSingh, nephrology.  BRIEF SUMMARY OF HOSPITAL COURSE:  [name] is a very pleasant 81 year old Caucasian female with history of diabetes, hypertension comes in with;  1. Altered mental status, likely due to UTI and hyponatremia. CT head does not show any acute findings. Mentation is stable and appears pleasant discharge. 2. Severe hyponatremia. Questionable history of SIADH versus excessive water intake. The patient was started on IV 3% hypertonic saline. Nephrology consultation was obtained. She came in with sodium of 113. On discharge, her sodium was 128.  Patient was restricted on p.o. fluids and she will followup with nephrology as outpatient. 3. Hypertension. Continued her home meds. 4. Type 2 diabetes. Resumed p.o.  metformin. 5. Physical therapy recommended home PT which has been set up.  TIME SPENT: 40 minutes.     ____________________________ Wylie HailSona A. Allena KatzPatel, MD sap:aj D: 07/03/2013 13:14:00 ET T: 07/04/2013 00:28:19 ET JOB#: [id number]  cc: Durward MallardJoel B. Marguerite OleaMoffett, MD Mosetta PigeonHarmeet Singh, MD Rhiana Morash A. Allena KatzPatel, MD, <Dictator>    Willow OraSONA A Braiden Rodman MD ELECTRONICALLY SIGNED 07/28/2013 15:33

## 2014-07-26 NOTE — Patient Outreach (Signed)
[facility]) Care Management  07/26/2014  Gina [name] 03/08/1917 [id number]   Referral from [facility] Tier 4 list, assigned to Donato Schultz, RN for patient outreach.  Geralene Afshar L. Arlo Butt, AAS [facility] Care Management Assistant

## 2014-08-09 ENCOUNTER — Other Ambulatory Visit: Payer: Self-pay

## 2014-08-09 NOTE — Patient Outreach (Signed)
[facility]) Care Management  08/09/2014  Gina [name] 09-30-1917 [id number]   Telephonic Care Management Note  Referral Date:  07/26/14 Referral Source:  [facility] Tier 4 Referral Issue:  DM, CHF with no admissions or ED visits.    Triage/Screening outreach call #1;  [phone] no answer following several rings.  RN CM scheduled for next outreach call within one week.   Donato Schultz, RN, BSN, [facility], CCM  [facility] [employer] Management Coordinator [phone] Direct [phone] Cell [phone] Office [phone] Fax

## 2014-08-11 ENCOUNTER — Other Ambulatory Visit: Payer: Medicare HMO

## 2014-08-11 NOTE — Patient Outreach (Signed)
[facility]) Care Management  08/11/2014  RETTA PITCHER [birth year] [id number]  Telephonic Care Management Note  Referral Date: 07/26/14 Referral Source: [facility] Tier 4 Referral Issue: DM, CHF with no admissions or ED visits.    Triage/Screening outreach call #2; [phone] no answer following several rings.  Epic MR reviewed. No PHI Release form noted to speak with other caregivers at this time.  RN CM scheduled for next outreach call within one week.   Donato Schultz, RN, BSN, [facility], CCM  [facility] [employer] Management Coordinator [phone] Direct [phone] Cell [phone] Office [phone] Fax

## 2014-08-16 ENCOUNTER — Other Ambulatory Visit: Payer: Self-pay

## 2014-08-16 NOTE — Patient Outreach (Addendum)
[facility]) Care Management  08/16/2014  Gina [name] [birth year] [id number]   Telephonic Care Management Note  Referral Date:  07/26/14 Referral Source:  [facility] Tier 4 Referral Issue:  DM, CHF with no admits or ED visits.   Outreach call # 3 to patient.  HCPOA/Caregiver/[name] Combs reached.  PCG states patient resides in her home with HHS: RN & PT services for wound management to bottom.  States DM is doing well and BS are well managed.  States patient does not have CHF.  States PCP is Dr. Park Breed and just completed office visit 08/2014.  States no needs at this time.  RN CM advised to notify PCP should needs arise or change in condition occurs.  RN CM notified [facility] administrative assistant to close case: - refused.  RN CM notified PCP of case closure: refused.   Donato Schultz, RN, BSN, [facility], CCM  [facility] [employer] Management Coordinator [phone] Direct [phone] Cell [phone] Office [phone] Fax

## 2014-08-16 NOTE — Patient Outreach (Signed)
[facility]) Care Management  08/16/2014  Gina [name] 1917-02-13 [id number]   Case closure letter to PCP:  Dr. Annett Fabian, RN, BSN, [facility], CCM  [facility] Management Coordinator [phone] Direct [phone] Cell [phone] Office [phone] Fax

## 2014-08-17 NOTE — Patient Outreach (Signed)
[facility]) Care Management  08/17/2014  Gina [name] 05-20-1917 [id number]  Notification received from [facility], RNCM to close case due to patient refusal of services.  Havah Ammon L. Crist Kruszka, AAS [facility] Care Management Assistant

## 2014-08-19 ENCOUNTER — Telehealth: Payer: Self-pay

## 2014-08-19 NOTE — Patient Outreach (Signed)
[facility]  08/19/2014  Gina [name] 11/09/1917 [id number]   Inbound voice message received from Dr. Sherryll Burger requesting call back from RN CM.  RN CM outreached to MD; Office Nurse states not available for call at this time but will call RN CM back.   Donato Schultz, RN, BSN, [facility], CCM  [facility] [employer] Management Coordinator [phone] Direct [phone] Cell [phone] Office [phone] Fax

## 2014-08-20 ENCOUNTER — Telehealth: Payer: Self-pay

## 2014-08-20 NOTE — Patient Outreach (Signed)
[facility]) Care Management  08/20/2014  Gina [name] 12-25-1917 [id number]   Inbound call received from Dr. Brayton El.  MD states he is not following patient for primary care; states last seen by Dr. Bard Herbert in March 2016 but Dr. Bard Herbert has left the practice.  States still has patients records on file and would be happy to see patient for Primary Care if patient elects to do so.   Per Epic MR review:  Daughter has previously reported PCP as Dr. Park Breed  RN CM updated Epic and removed Dr. Sherryll Burger as PCP.   Donato Schultz, RN, BSN, [facility], CCM  [employer] [employer] Coordinator [phone] Direct [phone] Cell [phone] Office [phone] Fax

## 2014-09-30 ENCOUNTER — Emergency Department: Payer: Medicare HMO

## 2014-09-30 ENCOUNTER — Emergency Department
Admission: EM | Admit: 2014-09-30 | Discharge: 2014-09-30 | Disposition: A | Discharge status: Home / Self Care | Payer: Medicare HMO | Attending: Emergency Medicine | Admitting: Emergency Medicine

## 2014-09-30 ENCOUNTER — Encounter: Payer: Self-pay | Admitting: Emergency Medicine

## 2014-09-30 DIAGNOSIS — S0031XA Abrasion of nose, initial encounter: Secondary | ICD-10-CM | POA: Diagnosis not present

## 2014-09-30 DIAGNOSIS — S62616A Displaced fracture of proximal phalanx of right little finger, initial encounter for closed fracture: Secondary | ICD-10-CM | POA: Insufficient documentation

## 2014-09-30 DIAGNOSIS — I10 Essential (primary) hypertension: Secondary | ICD-10-CM | POA: Insufficient documentation

## 2014-09-30 DIAGNOSIS — S0990XA Unspecified injury of head, initial encounter: Secondary | ICD-10-CM | POA: Diagnosis not present

## 2014-09-30 DIAGNOSIS — Y9289 Other specified places as the place of occurrence of the external cause: Secondary | ICD-10-CM | POA: Insufficient documentation

## 2014-09-30 DIAGNOSIS — S6991XA Unspecified injury of right wrist, hand and finger(s), initial encounter: Secondary | ICD-10-CM | POA: Diagnosis present

## 2014-09-30 DIAGNOSIS — W1830XA Fall on same level, unspecified, initial encounter: Secondary | ICD-10-CM | POA: Diagnosis not present

## 2014-09-30 DIAGNOSIS — E119 Type 2 diabetes mellitus without complications: Secondary | ICD-10-CM | POA: Insufficient documentation

## 2014-09-30 DIAGNOSIS — Y9389 Activity, other specified: Secondary | ICD-10-CM | POA: Diagnosis not present

## 2014-09-30 DIAGNOSIS — Y998 Other external cause status: Secondary | ICD-10-CM | POA: Insufficient documentation

## 2014-09-30 DIAGNOSIS — S62609A Fracture of unspecified phalanx of unspecified finger, initial encounter for closed fracture: Secondary | ICD-10-CM

## 2014-09-30 HISTORY — DX: Essential (primary) hypertension: I10

## 2014-09-30 HISTORY — DX: Anxiety disorder, unspecified: F41.9

## 2014-09-30 HISTORY — DX: Type 2 diabetes mellitus without complications: E11.9

## 2014-09-30 HISTORY — DX: Unspecified dementia, unspecified severity, without behavioral disturbance, psychotic disturbance, mood disturbance, and anxiety: F03.90

## 2014-09-30 NOTE — ED Provider Notes (Signed)
[facility] Emergency Department Provider Note  ____________________________________________  Time seen: On arrival, via EMS  I have reviewed the triage vital signs and the nursing notes.   HISTORY  Chief Complaint Fall   History limited by chronic dementia. Patient is a patient at the memory unit at [facility] Gina [name] is a 81 y.o. female who presents after an unwitnessed fall last night. Staff reports patient found down with swelling to right forehead. They sent her to the ED this morning when an ice pack did not decrease the swelling. Patient is apparently at her baseline according to her daughter. Unable to obtain full history given her dementia    Past Medical History  Diagnosis Date  . Diabetes mellitus without complication   . Hypertension   . Dementia   . Anxiety     Patient Active Problem List   Diagnosis Date Noted  . HYPOTHYROIDISM 04/10/2006  . DIABETES MELLITUS, WITH GASTROPARESIS 04/10/2006  . HYPERCHOLESTEROLEMIA 04/10/2006  . DEMENTIA 04/10/2006  . ANXIETY 04/10/2006  . HYPERTENSION 04/10/2006  . ATHEROSCLEROTIC CARDIOVASCULAR DISEASE 04/10/2006  . GERD 04/10/2006  . CONSTIPATION 04/10/2006  . DIABETES MELLITUS, TYPE II 05/09/2005    No past surgical history on file.  No current outpatient prescriptions on file.  Allergies Review of patient's allergies indicates no known allergies.  No family history on file.  Social History Social History  Substance Use Topics  . Smoking status: Never Smoker   . Smokeless tobacco: Never Used  . Alcohol Use: No    Review of Systems  Level V caveat: Unable to obtain full review of systems given dementia    ____________________________________________   PHYSICAL EXAM:  VITAL SIGNS: ED Triage Vitals  Enc Vitals Group     BP 09/30/14 0727 190/98 mmHg     Pulse Rate 09/30/14 0727 94     Resp --      Temp 09/30/14 0727 98.2 F (36.8 C)     Temp Source  09/30/14 0727 Oral     SpO2 09/30/14 0727 98 %     Weight 09/30/14 0727 163 lb (73.936 kg)     Height 09/30/14 0727  (1.626 m)     Head Cir --      Peak Flow --      Pain Score --      Pain Loc --      Pain Edu? --      Excl. in GC? --     Constitutional: Alert. Well appearing and in no distress. Eyes: Conjunctivae are normal.  ENT   Head: Normocephalic. Hematoma over the right forehead, no bleeding. Abrasion to the left bridge of the nose nonbleeding   Mouth/Throat: Mucous membranes are moist. Cardiovascular: Normal rate, regular rhythm. Normal and symmetric distal pulses are present in all extremities. No murmurs, rubs, or gallops. Respiratory: Normal respiratory effort without tachypnea nor retractions. Breath sounds are clear and equal bilaterally.  Gastrointestinal: Soft and non-tender in all quadrants. No distention. There is no CVA tenderness. Genitourinary: deferred Musculoskeletal: Nontender with normal range of motion in all extremities. No lower extremity tenderness nor edema. Full range of motion in the legs without assistance. No hip pain with axial load. Bruising to the fourth and fifth fingers of the right hand and full range of motion and no palpable fracture Neurologic:  . No gross focal neurologic deficits are appreciated. Skin:  Skin is warm, dry. No rash noted. Psychiatric: Mood and affect are normal.  ____________________________________________    LABS (pertinent positives/negatives)  Labs Reviewed - No data to display  ____________________________________________   EKG  ED ECG REPORT I, Jene Every, the attending physician, personally viewed and interpreted this ECG.   Date: 09/30/2014  EKG Time: 7:32 AM  Rate: 87  Rhythm: 1st degree AV block  Axis: Normal axis  Intervals:Incomplete right bundle branch block  ST&T Change: Nonspecific   ____________________________________________    RADIOLOGY I have personally reviewed any  xrays that were ordered on this patient: X-ray shows right fifth finger fracture CT head unremarkable ____________________________________________   PROCEDURES  Procedure(s) performed: none  Critical Care performed: none  ____________________________________________   INITIAL IMPRESSION / ASSESSMENT AND PLAN / ED COURSE  Pertinent labs & imaging results that were available during my care of the patient were reviewed by me and considered in my medical decision making (see chart for details).  Patient overall well-appearing. She has a hematoma to the right forehead, abrasion to the nose and bruising to the right hand. We will obtain a CT head and x-rays of the hand and reevaluate. Do not see any reason for labs at this point. I discussed with daughter and she agrees with plan  ----------------------------------------- 9:13 AM on 09/30/2014 -----------------------------------------  Splint ordered for right fifth finger fracture. Discussed with daughter the results of CT and x-rays. Patient is well-appearing, she has a history of chronic hypertension. She will need to follow this up with her PCP  ____________________________________________   FINAL CLINICAL IMPRESSION(S) / ED DIAGNOSES  Final diagnoses:  None     Jene Every, MD 09/30/14 [phone]

## 2014-09-30 NOTE — Discharge Instructions (Signed)
Finger Fracture Fractures of fingers are breaks in the bones of the fingers. There are many types of fractures. There are different ways of treating these fractures. Your health care provider will discuss the best way to treat your fracture. CAUSES Traumatic injury is the main cause of broken fingers. These include:  Injuries while playing sports.  Workplace injuries.  Falls. RISK FACTORS Activities that can increase your risk of finger fractures include:  Sports.  Workplace activities that involve machinery.  A condition called osteoporosis, which can make your bones less dense and cause them to fracture more easily. SIGNS AND SYMPTOMS The main symptoms of a broken finger are pain and swelling within 15 minutes after the injury. Other symptoms include:  Bruising of your finger.  Stiffness of your finger.  Numbness of your finger.  Exposed bones (compound fracture) if the fracture is severe. DIAGNOSIS  The best way to diagnose a broken bone is with X-ray imaging. Additionally, your health care provider will use this X-ray image to evaluate the position of the broken finger bones.  TREATMENT  Finger fractures can be treated with:   Nonreduction--This means the bones are in place. The finger is splinted without changing the positions of the bone pieces. The splint is usually left on for about a week to 10 days. This will depend on your fracture and what your health care provider thinks.  Closed reduction--The bones are put back into position without using surgery. The finger is then splinted.  Open reduction and internal fixation--The fracture site is opened. Then the bone pieces are fixed into place with pins or some type of hardware. This is seldom required. It depends on the severity of the fracture. HOME CARE INSTRUCTIONS   Follow your health care provider's instructions regarding activities, exercises, and physical therapy.  Only take over-the-counter or prescription  medicines for pain, discomfort, or fever as directed by your health care provider. SEEK MEDICAL CARE IF: You have pain or swelling that limits the motion or use of your fingers. SEEK IMMEDIATE MEDICAL CARE IF:  Your finger becomes numb. MAKE SURE YOU:   Understand these instructions.  Will watch your condition.  Will get help right away if you are not doing well or get worse. Document Released: 04/01/2000 Document Revised: 10/08/2012 Document Reviewed: 07/30/2012 [facility] Patient Information 2015 [place], [place]. This information is not intended to replace advice given to you by your health care provider. Make sure you discuss any questions you have with your health care provider.  Head Injury You have a head injury. Headaches and throwing up (vomiting) are common after a head injury. It should be easy to wake up from sleeping. Sometimes you must stay in the hospital. Most problems happen within the first 24 hours. Side effects may occur up to 7-10 days after the injury.  WHAT ARE THE TYPES OF HEAD INJURIES? Head injuries can be as minor as a bump. Some head injuries can be more severe. More severe head injuries include:  A jarring injury to the brain (concussion).  A bruise of the brain (contusion). This mean there is bleeding in the brain that can cause swelling.  A cracked skull (skull fracture).  Bleeding in the brain that collects, clots, and forms a bump (hematoma). WHEN SHOULD I GET HELP RIGHT AWAY?   You are confused or sleepy.  You cannot be woken up.  You feel sick to your stomach (nauseous) or keep throwing up (vomiting).  Your dizziness or unsteadiness is getting worse.  You have very bad, lasting headaches that are not helped by medicine. Take medicines only as told by your doctor.  You cannot use your arms or legs like normal.  You cannot walk.  You notice changes in the black spots in the center of the colored part of your eye (pupil).  You have clear or  bloody fluid coming from your nose or ears.  You have trouble seeing. During the next 24 hours after the injury, you must stay with someone who can watch you. This person should get help right away (call 911 in the [place] if you start to shake and are not able to control it (have seizures), you pass out, or you are unable to wake up. HOW CAN I PREVENT A HEAD INJURY IN THE FUTURE?  Wear seat belts.  Wear a helmet while bike riding and playing sports like football.  Stay away from dangerous activities around the house. WHEN CAN I RETURN TO NORMAL ACTIVITIES AND ATHLETICS? See your doctor before doing these activities. You should not do normal activities or play contact sports until 1 week after the following symptoms have stopped:  Headache that does not go away.  Dizziness.  Poor attention.  Confusion.  Memory problems.  Sickness to your stomach or throwing up.  Tiredness.  Fussiness.  Bothered by bright lights or loud noises.  Anxiousness or depression.  Restless sleep. MAKE SURE YOU:   Understand these instructions.  Will watch your condition.  Will get help right away if you are not doing well or get worse. Document Released: 12/01/2007 Document Revised: 05/04/2013 Document Reviewed: 08/25/2012 [facility] Patient Information 2015 [place], [place]. This information is not intended to replace advice given to you by your health care provider. Make sure you discuss any questions you have with your health care provider.

## 2014-09-30 NOTE — ED Notes (Addendum)
Ems from [facility] s/p unwitnessed fall. Pt has bruised area above right eye, small cut on nose and bruised right fingers #4, #5.

## 2014-10-20 ENCOUNTER — Emergency Department: Payer: Medicare HMO

## 2014-10-20 ENCOUNTER — Inpatient Hospital Stay
Admission: EM | Admit: 2014-10-20 | Discharge: 2014-10-25 | DRG: 481 | Disposition: A | Discharge status: Skilled Nursing Facility | Payer: Medicare HMO | Attending: Internal Medicine | Admitting: Internal Medicine

## 2014-10-20 DIAGNOSIS — Z79899 Other long term (current) drug therapy: Secondary | ICD-10-CM

## 2014-10-20 DIAGNOSIS — K5790 Diverticulosis of intestine, part unspecified, without perforation or abscess without bleeding: Secondary | ICD-10-CM | POA: Diagnosis present

## 2014-10-20 DIAGNOSIS — S72009A Fracture of unspecified part of neck of unspecified femur, initial encounter for closed fracture: Secondary | ICD-10-CM | POA: Diagnosis present

## 2014-10-20 DIAGNOSIS — W1839XA Other fall on same level, initial encounter: Secondary | ICD-10-CM | POA: Diagnosis present

## 2014-10-20 DIAGNOSIS — I10 Essential (primary) hypertension: Secondary | ICD-10-CM | POA: Diagnosis present

## 2014-10-20 DIAGNOSIS — D500 Iron deficiency anemia secondary to blood loss (chronic): Secondary | ICD-10-CM | POA: Diagnosis present

## 2014-10-20 DIAGNOSIS — N3000 Acute cystitis without hematuria: Secondary | ICD-10-CM | POA: Diagnosis present

## 2014-10-20 DIAGNOSIS — E871 Hypo-osmolality and hyponatremia: Secondary | ICD-10-CM | POA: Diagnosis present

## 2014-10-20 DIAGNOSIS — B9620 Unspecified Escherichia coli [E. coli] as the cause of diseases classified elsewhere: Secondary | ICD-10-CM | POA: Diagnosis present

## 2014-10-20 DIAGNOSIS — S72141A Displaced intertrochanteric fracture of right femur, initial encounter for closed fracture: Secondary | ICD-10-CM | POA: Diagnosis present

## 2014-10-20 DIAGNOSIS — Z833 Family history of diabetes mellitus: Secondary | ICD-10-CM

## 2014-10-20 DIAGNOSIS — Z7984 Long term (current) use of oral hypoglycemic drugs: Secondary | ICD-10-CM | POA: Diagnosis not present

## 2014-10-20 DIAGNOSIS — E039 Hypothyroidism, unspecified: Secondary | ICD-10-CM | POA: Diagnosis present

## 2014-10-20 DIAGNOSIS — Z8249 Family history of ischemic heart disease and other diseases of the circulatory system: Secondary | ICD-10-CM | POA: Diagnosis not present

## 2014-10-20 DIAGNOSIS — E119 Type 2 diabetes mellitus without complications: Secondary | ICD-10-CM | POA: Diagnosis present

## 2014-10-20 DIAGNOSIS — F0390 Unspecified dementia without behavioral disturbance: Secondary | ICD-10-CM | POA: Diagnosis present

## 2014-10-20 DIAGNOSIS — M810 Age-related osteoporosis without current pathological fracture: Secondary | ICD-10-CM | POA: Diagnosis present

## 2014-10-20 DIAGNOSIS — L8990 Pressure ulcer of unspecified site, unspecified stage: Secondary | ICD-10-CM | POA: Insufficient documentation

## 2014-10-20 DIAGNOSIS — F419 Anxiety disorder, unspecified: Secondary | ICD-10-CM | POA: Diagnosis present

## 2014-10-20 DIAGNOSIS — K219 Gastro-esophageal reflux disease without esophagitis: Secondary | ICD-10-CM | POA: Diagnosis present

## 2014-10-20 DIAGNOSIS — Y92129 Unspecified place in nursing home as the place of occurrence of the external cause: Secondary | ICD-10-CM | POA: Diagnosis not present

## 2014-10-20 DIAGNOSIS — S72001A Fracture of unspecified part of neck of right femur, initial encounter for closed fracture: Secondary | ICD-10-CM

## 2014-10-20 DIAGNOSIS — Z419 Encounter for procedure for purposes other than remedying health state, unspecified: Secondary | ICD-10-CM

## 2014-10-20 DIAGNOSIS — L89151 Pressure ulcer of sacral region, stage 1: Secondary | ICD-10-CM | POA: Diagnosis present

## 2014-10-20 HISTORY — DX: Hypothyroidism, unspecified: E03.9

## 2014-10-20 HISTORY — DX: Gastro-esophageal reflux disease without esophagitis: K21.9

## 2014-10-20 HISTORY — DX: Age-related osteoporosis without current pathological fracture: M81.0

## 2014-10-20 LAB — URINALYSIS COMPLETE WITH MICROSCOPIC (ARMC ONLY)
BILIRUBIN URINE: NEGATIVE
Glucose, UA: NEGATIVE mg/dL
HGB URINE DIPSTICK: NEGATIVE
NITRITE: POSITIVE — AB
PH: 6 (ref 5.0–8.0)
Protein, ur: 30 mg/dL — AB
SPECIFIC GRAVITY, URINE: 1.014 (ref 1.005–1.030)

## 2014-10-20 LAB — CBC WITH DIFFERENTIAL/PLATELET
BASOS ABS: 0.1 10*3/uL (ref 0–0.1)
BASOS PCT: 1 %
EOS ABS: 0 10*3/uL (ref 0–0.7)
Eosinophils Relative: 0 %
HCT: 32.9 % — ABNORMAL LOW (ref 35.0–47.0)
HEMOGLOBIN: 11.1 g/dL — AB (ref 12.0–16.0)
LYMPHS ABS: 2.4 10*3/uL (ref 1.0–3.6)
Lymphocytes Relative: 22 %
MCH: 31.4 pg (ref 26.0–34.0)
MCHC: 33.7 g/dL (ref 32.0–36.0)
MCV: 93.1 fL (ref 80.0–100.0)
Monocytes Absolute: 1 10*3/uL — ABNORMAL HIGH (ref 0.2–0.9)
Monocytes Relative: 9 %
NEUTROS PCT: 68 %
Neutro Abs: 7.1 10*3/uL — ABNORMAL HIGH (ref 1.4–6.5)
Platelets: 150 10*3/uL (ref 150–440)
RBC: 3.53 MIL/uL — AB (ref 3.80–5.20)
RDW: 13.6 % (ref 11.5–14.5)
WBC: 10.6 10*3/uL (ref 3.6–11.0)

## 2014-10-20 LAB — APTT: aPTT: 29 seconds (ref 24–36)

## 2014-10-20 LAB — BASIC METABOLIC PANEL
Anion gap: 9 (ref 5–15)
BUN: 14 mg/dL (ref 6–20)
CHLORIDE: 100 mmol/L — AB (ref 101–111)
CO2: 25 mmol/L (ref 22–32)
CREATININE: 1.03 mg/dL — AB (ref 0.44–1.00)
Calcium: 8.9 mg/dL (ref 8.9–10.3)
GFR calc non Af Amer: 45 mL/min — ABNORMAL LOW (ref >60)
GFR, EST AFRICAN AMERICAN: 52 mL/min — AB (ref >60)
Glucose, Bld: 162 mg/dL — ABNORMAL HIGH (ref 65–99)
Potassium: 4.2 mmol/L (ref 3.5–5.1)
SODIUM: 134 mmol/L — AB (ref 135–145)

## 2014-10-20 LAB — PROTIME-INR
INR: 1.11
Prothrombin Time: 14.5 seconds (ref 11.4–15.0)

## 2014-10-20 LAB — MRSA PCR SCREENING: MRSA BY PCR: POSITIVE — AB

## 2014-10-20 LAB — GLUCOSE, CAPILLARY
GLUCOSE-CAPILLARY: 181 mg/dL — AB (ref 65–99)
Glucose-Capillary: 234 mg/dL — ABNORMAL HIGH (ref 65–99)

## 2014-10-20 LAB — ALBUMIN: Albumin: 3.4 g/dL — ABNORMAL LOW (ref 3.5–5.0)

## 2014-10-20 MED ORDER — SODIUM CHLORIDE 1 G PO TABS
1.0000 g | ORAL_TABLET | Freq: Two times a day (BID) | ORAL | Status: DC
  Administered 2014-10-20: 1 g via ORAL
  Filled 2014-10-20: qty 1

## 2014-10-20 MED ORDER — VERAPAMIL HCL ER 240 MG PO CP24
240.0000 mg | ORAL_CAPSULE | Freq: Every day | ORAL | Status: DC
  Filled 2014-10-20: qty 1

## 2014-10-20 MED ORDER — DEXTROSE 5 % IV SOLN
1.0000 g | INTRAVENOUS | Status: DC
  Administered 2014-10-20: 1 g via INTRAVENOUS
  Filled 2014-10-20 (×2): qty 10

## 2014-10-20 MED ORDER — ZOLPIDEM TARTRATE 5 MG PO TABS: 5.0000 mg | ORAL_TABLET | Freq: Every evening | ORAL | Status: DC | PRN

## 2014-10-20 MED ORDER — SENNA 8.6 MG PO TABS
1.0000 | ORAL_TABLET | Freq: Two times a day (BID) | ORAL | Status: DC
  Administered 2014-10-20: 8.6 mg via ORAL
  Filled 2014-10-20: qty 1

## 2014-10-20 MED ORDER — HYDROCODONE-ACETAMINOPHEN 5-325 MG PO TABS
1.0000 | ORAL_TABLET | Freq: Four times a day (QID) | ORAL | Status: DC | PRN
  Administered 2014-10-20: 1 via ORAL
  Filled 2014-10-20: qty 1

## 2014-10-20 MED ORDER — ALPRAZOLAM 0.5 MG PO TABS
0.5000 mg | ORAL_TABLET | Freq: Every day | ORAL | Status: DC
  Administered 2014-10-20: 0.5 mg via ORAL
  Filled 2014-10-20: qty 1

## 2014-10-20 MED ORDER — ALPRAZOLAM 0.25 MG PO TABS: 0.2500 mg | ORAL_TABLET | ORAL | Status: DC

## 2014-10-20 MED ORDER — CHLORHEXIDINE GLUCONATE CLOTH 2 % EX PADS: 6.0000 | MEDICATED_PAD | Freq: Every day | CUTANEOUS | Status: DC

## 2014-10-20 MED ORDER — METHOCARBAMOL 500 MG PO TABS: 500.0000 mg | ORAL_TABLET | Freq: Four times a day (QID) | ORAL | Status: DC | PRN

## 2014-10-20 MED ORDER — LOPERAMIDE HCL 2 MG PO CAPS: 2.0000 mg | ORAL_CAPSULE | ORAL | Status: DC | PRN

## 2014-10-20 MED ORDER — SODIUM CHLORIDE 0.45 % IV SOLN
INTRAVENOUS | Status: DC
  Administered 2014-10-20: 16:00:00 via INTRAVENOUS

## 2014-10-20 MED ORDER — METHOCARBAMOL 1000 MG/10ML IJ SOLN: 500.0000 mg | Freq: Four times a day (QID) | INTRAVENOUS | Status: DC | PRN

## 2014-10-20 MED ORDER — MORPHINE SULFATE (PF) 2 MG/ML IV SOLN: 0.5000 mg | INTRAVENOUS | Status: DC | PRN

## 2014-10-20 MED ORDER — LEVOTHYROXINE SODIUM 25 MCG PO TABS: 137.0000 ug | ORAL_TABLET | Freq: Every day | ORAL | Status: DC

## 2014-10-20 MED ORDER — BACITRACIN-NEOMYCIN-POLYMYXIN 400-5-5000 EX OINT
1.0000 "application " | TOPICAL_OINTMENT | CUTANEOUS | Status: DC | PRN
  Filled 2014-10-20: qty 1

## 2014-10-20 MED ORDER — INSULIN ASPART 100 UNIT/ML ~~LOC~~ SOLN
0.0000 [IU] | Freq: Three times a day (TID) | SUBCUTANEOUS | Status: DC
  Administered 2014-10-20: 3 [IU] via SUBCUTANEOUS
  Filled 2014-10-20: qty 3

## 2014-10-20 MED ORDER — CLINDAMYCIN PHOSPHATE 900 MG/50ML IV SOLN
900.0000 mg | INTRAVENOUS | Status: AC
  Administered 2014-10-21: 900 mg via INTRAVENOUS
  Filled 2014-10-20: qty 50

## 2014-10-20 MED ORDER — TRAZODONE HCL 50 MG PO TABS
50.0000 mg | ORAL_TABLET | Freq: Every day | ORAL | Status: DC
  Administered 2014-10-20: 50 mg via ORAL
  Filled 2014-10-20: qty 1

## 2014-10-20 MED ORDER — SALINE SPRAY 0.65 % NA SOLN
2.0000 | Freq: Four times a day (QID) | NASAL | Status: DC | PRN
  Filled 2014-10-20: qty 44

## 2014-10-20 MED ORDER — INSULIN ASPART 100 UNIT/ML ~~LOC~~ SOLN: 0.0000 [IU] | Freq: Every day | SUBCUTANEOUS | Status: DC

## 2014-10-20 MED ORDER — CEFAZOLIN SODIUM-DEXTROSE 2-3 GM-% IV SOLR
2.0000 g | INTRAVENOUS | Status: AC
  Administered 2014-10-21: 2 g via INTRAVENOUS
  Filled 2014-10-20: qty 50

## 2014-10-20 MED ORDER — FENTANYL CITRATE (PF) 100 MCG/2ML IJ SOLN
50.0000 ug | Freq: Once | INTRAMUSCULAR | Status: AC
  Administered 2014-10-20: 50 ug via INTRAVENOUS
  Filled 2014-10-20: qty 2

## 2014-10-20 MED ORDER — ONDANSETRON HCL 4 MG/2ML IJ SOLN
4.0000 mg | Freq: Once | INTRAMUSCULAR | Status: AC
  Administered 2014-10-20: 4 mg via INTRAVENOUS
  Filled 2014-10-20: qty 2

## 2014-10-20 MED ORDER — NYSTATIN-TRIAMCINOLONE 100000-0.1 UNIT/GM-% EX CREA
1.0000 "application " | TOPICAL_CREAM | Freq: Two times a day (BID) | CUTANEOUS | Status: DC
  Administered 2014-10-21: 1 via TOPICAL
  Filled 2014-10-20: qty 15

## 2014-10-20 MED ORDER — VITAMIN C 500 MG PO TABS
500.0000 mg | ORAL_TABLET | Freq: Every day | ORAL | Status: DC
  Administered 2014-10-20: 500 mg via ORAL
  Filled 2014-10-20: qty 1

## 2014-10-20 MED ORDER — MUPIROCIN 2 % EX OINT
1.0000 | TOPICAL_OINTMENT | Freq: Two times a day (BID) | CUTANEOUS | Status: DC
Start: 2014-10-20 — End: 2014-10-21
  Administered 2014-10-20 – 2014-10-21 (×2): 1 via NASAL
  Filled 2014-10-20: qty 22

## 2014-10-20 MED ORDER — PANTOPRAZOLE SODIUM 40 MG PO TBEC: 40.0000 mg | DELAYED_RELEASE_TABLET | Freq: Every day | ORAL | Status: DC

## 2014-10-20 MED ORDER — ALPRAZOLAM 0.25 MG PO TABS
0.2500 mg | ORAL_TABLET | Freq: Every day | ORAL | Status: DC
  Administered 2014-10-20: 0.25 mg via ORAL
  Filled 2014-10-20: qty 1

## 2014-10-20 MED ORDER — ATORVASTATIN CALCIUM 10 MG PO TABS
10.0000 mg | ORAL_TABLET | Freq: Every day | ORAL | Status: DC
  Administered 2014-10-20: 10 mg via ORAL
  Filled 2014-10-20: qty 1

## 2014-10-20 NOTE — Consult Note (Signed)
ORTHOPAEDIC CONSULTATION  REQUESTING PHYSICIAN: Governor Rooks, MD  Chief Complaint: Right hip pain  HPI: Gina [name] is a 81 y.o. female who complains of  right hip pain due to a fall at her memory care facility yesterday.  She apparently tripped using her walker.  Her family discovered today that her right leg was painful and shortened and had her brought to the emergency room.  Exam and x-rays reveal a comminuted displaced right intertrochanteric hip fracture.  I have spoken to Dr. Shaune Pollack.  The emergency room physician and to the patient's daughter.  I've explained the nature of the fracture to the patient's daughter and recommended surgical fixation of the fracture.  The risks and benefits of operative versus nonoperative treatment were discussed in detail.  The risks and benefits of surgery discussed, along with postop protocols.  At her request we'll schedule her for surgery tomorrow morning after hydration overnight.  Past Medical History  Diagnosis Date  . Diabetes mellitus without complication (HCC)   . Hypertension   . Dementia   . Anxiety   . GERD (gastroesophageal reflux disease)   . Osteoporosis   . Hypothyroidism    History reviewed. No pertinent past surgical history. Social History   Social History  . Marital Status: Married    Spouse Name: N/A  . Number of Children: N/A  . Years of Education: N/A   Social History Main Topics  . Smoking status: Never Smoker   . Smokeless tobacco: Never Used  . Alcohol Use: No  . Drug Use: No  . Sexual Activity: No   Other Topics Concern  . None   Social History Narrative   No family history on file. Allergies  Allergen Reactions  . Fosamax [Alendronate Sodium] Other (See Comments)    Unknown   Prior to Admission medications   Medication Sig Start Date End Date Taking? Authorizing Provider  ALPRAZolam (XANAX) 0.25 MG tablet Take 0.25 mg by mouth daily at 12 noon.   Yes Historical Provider, MD  ALPRAZolam (XANAX)  0.25 MG tablet Take 0.25 mg by mouth every morning.   Yes Historical Provider, MD  ALPRAZolam Prudy Feeler) 0.5 MG tablet Take 0.5 mg by mouth at bedtime.   Yes Historical Provider, MD  atorvastatin (LIPITOR) 10 MG tablet Take 10 mg by mouth daily.   Yes Historical Provider, MD  levothyroxine (SYNTHROID, LEVOTHROID) 137 MCG tablet Take 137 mcg by mouth daily before breakfast.   Yes Historical Provider, MD  loperamide (IMODIUM) 2 MG capsule Take 2 mg by mouth as needed for diarrhea or loose stools.   Yes Historical Provider, MD  metFORMIN (GLUCOPHAGE) 500 MG tablet Take 500 mg by mouth 2 (two) times daily with a meal.   Yes Historical Provider, MD  neomycin-bacitracin-polymyxin (NEOSPORIN) ointment Apply 1 application topically as needed for wound care. apply to eye   Yes Historical Provider, MD  nystatin-triamcinolone (MYCOLOG II) cream Apply 1 application topically as needed.   Yes Historical Provider, MD  omeprazole (PRILOSEC) 20 MG capsule Take 20 mg by mouth daily.   Yes Historical Provider, MD  ranitidine (ZANTAC) 300 MG tablet Take 300 mg by mouth at bedtime.   Yes Historical Provider, MD  sodium chloride (OCEAN) 0.65 % SOLN nasal spray Place 2 sprays into both nostrils 4 (four) times daily as needed for congestion.   Yes Historical Provider, MD  sodium chloride 1 G tablet Take 1 g by mouth 2 (two) times daily with a meal.   Yes Historical Provider, MD  traZODone (DESYREL) 50 MG tablet Take 50 mg by mouth at bedtime.   Yes Historical Provider, MD  verapamil (VERELAN PM) 240 MG 24 hr capsule Take 240 mg by mouth daily at 12 noon.   Yes Historical Provider, MD  vitamin C (ASCORBIC ACID) 500 MG tablet Take 500 mg by mouth daily.   Yes Historical Provider, MD   Dg Chest 1 View  10/20/2014  CLINICAL DATA:  Unwitnessed fall, dementia EXAM: CHEST 1 VIEW COMPARISON:  09/08/2013 FINDINGS: The heart size and mediastinal contours are within normal limits. Both lungs are clear. The visualized skeletal  structures are unremarkable. IMPRESSION: No active disease. Electronically Signed   By: Charlett NoseKevin  Dover M.D.   On: 10/20/2014 09:45   Dg Thoracic Spine 2 View  10/20/2014  CLINICAL DATA:  Mid/lower back pain. Fall yesterday. Initial encounter. EXAM: THORACIC SPINE 2 VIEWS COMPARISON:  Chest CT 05/29/2013 FINDINGS: There are 12 full sized pairs of ribs. Mild S-shaped thoracolumbar scoliosis is again seen, convex to the right in the mid thoracic spine. There is no listhesis. Thoracic vertebral body heights are preserved without evidence of compression fracture. Mild thoracic spondylosis is present. The bones are diffusely osteopenic. Atherosclerotic vascular calcification is noted. IMPRESSION: 1. No acute osseous abnormality identified in the thoracic spine. 2. Mild thoracic scoliosis and spondylosis. Electronically Signed   By: Sebastian AcheAllen  Grady M.D.   On: 10/20/2014 09:45   Dg Lumbar Spine 2-3 Views  10/20/2014  CLINICAL DATA:  Status post fall EXAM: LUMBAR SPINE - 2-3 VIEW COMPARISON:  10/20/2012 FINDINGS: There is a chronic L2 vertebral body compression fracture unchanged compared with 10/20/2012. The remainder the vertebral body heights are maintained. Alignment is normal. There is degenerative disc disease with disc height loss at L4-5. There is diffuse bilateral facet arthropathy throughout the lumbar spine. There is thoracic aortic atherosclerosis. IMPRESSION: 1. No acute osseous injury of the lumbar spine. 2. Chronic L2 vertebral body compression fracture. Electronically Signed   By: Elige KoHetal  Patel   On: 10/20/2014 09:40   Dg Hip Unilat With Pelvis 2-3 Views Right  10/20/2014  CLINICAL DATA:  Fall yesterday. Shortened right leg. Right hip pain. EXAM: DG HIP (WITH OR WITHOUT PELVIS) 2-3V RIGHT COMPARISON:  None FINDINGS: There is a a right femoral neck fracture, likely intertrochanteric. Varus angulation. No subluxation or dislocation. Mild symmetric degenerative changes in the hips bilaterally. IMPRESSION:  Right proximal femoral intertrochanteric fracture with varus angulation. Electronically Signed   By: Charlett NoseKevin  Dover M.D.   On: 10/20/2014 09:44    Positive ROS: All other systems have been reviewed and were otherwise negative with the exception of those mentioned in the HPI and as above.  Physical Exam: General: Alert, no acute distress Cardiovascular: No pedal edema Respiratory: No cyanosis, no use of accessory musculature GI: No organomegaly, abdomen is soft and non-tender Skin: No lesions in the area of chief complaint Neurologic: Sensation intact distally Psychiatric: Patient is competent for consent with normal mood and affect Lymphatic: No axillary or cervical lymphadenopathy  MUSCULOSKELETAL: Patient is awake and alert.  She does answer some questions but is confused about others.  The right leg is short and externally rotated.  There is pain with movement.  Neurovascular status is good distally.  There is mild edema above the ankle.  The left leg shows no pain with movement.  The upper extremities and spine are nonpainful.  The skin is intact.  Assessment: Displaced intertrochanteric fracture right hip  Plan: Right hip nailing tomorrow morning  Valinda Hoar, MD [phone]   10/20/2014 11:08 AM

## 2014-10-20 NOTE — ED Notes (Signed)
[facility]. At 5pm, pt was (per EMS report), "put back in wheelchair and taken to dinner". Pt's R hip is rotated, R leg is shorted, pedal pulse strong, R leg warm, cap refil in R toes <3 secs. Pt states not in any pain. Pt has abrasion/skin tear to R elbow that is covered by bandaid.

## 2014-10-20 NOTE — H&P (Signed)
[facility] [place] [facility] [place] [facility]   PATIENT NAME: Gina [name]    MR#:  [id number]  DATE OF BIRTH:  12/14/1917  DATE OF ADMISSION:  10/20/2014  PRIMARY CARE PHYSICIAN: No primary care provider on file.   REQUESTING/REFERRING PHYSICIAN: Dr.Lord  CHIEF COMPLAINT:   Hip pain HISTORY OF PRESENT ILLNESS:  Gina [name]  is a 81 y.o. female with a known history of diverticular disease, hypertension, hypothyroidism and dementia is a resident at [facility] unit and is brought into the ED after she had an un-witnessed fall yesterday evening. In the ED x-rays revealed right intertrochanteric fracture-patient was seen by orthopedics Dr. Hyacinth Meeker . Patient is resting comfortably during my examination and daughter is at bedside PAST MEDICAL HISTORY:   Past Medical History  Diagnosis Date  . Diabetes mellitus without complication (HCC)   . Hypertension   . Dementia   . Anxiety   . GERD (gastroesophageal reflux disease)   . Osteoporosis   . Hypothyroidism     PAST SURGICAL HISTOIRY:  History reviewed. No pertinent past surgical history.  SOCIAL HISTORY:   Social History  Substance Use Topics  . Smoking status: Never Smoker   . Smokeless tobacco: Never Used  . Alcohol Use: No    FAMILY HISTORY:   Hypertension and diabetes mellitus runs in her family DRUG ALLERGIES:   Allergies  Allergen Reactions  . Fosamax [Alendronate Sodium] Other (See Comments)    Unknown    REVIEW OF SYSTEMS:  able to provide some history  CONSTITUTIONAL: No fever, fatigue or weakness. .  RESPIRATORY: No cough, shortness of breath, wheezing or hemoptysis.  CARDIOVASCULAR: No chest pain, orthopnea, edema.  GENITOURINARY: Reporting dysuria HEMATOLOGY: No anemia, easy bruising or bleeding   MEDICATIONS AT HOME:   Prior to Admission medications   Medication Sig Start Date End Date Taking? Authorizing Provider  ALPRAZolam (XANAX) 0.25 MG tablet Take 0.25 mg by  mouth daily at 12 noon.   Yes Historical Provider, MD  ALPRAZolam (XANAX) 0.25 MG tablet Take 0.25 mg by mouth every morning.   Yes Historical Provider, MD  ALPRAZolam Prudy Feeler) 0.5 MG tablet Take 0.5 mg by mouth at bedtime.   Yes Historical Provider, MD  atorvastatin (LIPITOR) 10 MG tablet Take 10 mg by mouth daily.   Yes Historical Provider, MD  levothyroxine (SYNTHROID, LEVOTHROID) 137 MCG tablet Take 137 mcg by mouth daily before breakfast.   Yes Historical Provider, MD  loperamide (IMODIUM) 2 MG capsule Take 2 mg by mouth as needed for diarrhea or loose stools.   Yes Historical Provider, MD  metFORMIN (GLUCOPHAGE) 500 MG tablet Take 500 mg by mouth 2 (two) times daily with a meal.   Yes Historical Provider, MD  neomycin-bacitracin-polymyxin (NEOSPORIN) ointment Apply 1 application topically as needed for wound care. apply to eye   Yes Historical Provider, MD  nystatin-triamcinolone (MYCOLOG II) cream Apply 1 application topically as needed.   Yes Historical Provider, MD  omeprazole (PRILOSEC) 20 MG capsule Take 20 mg by mouth daily.   Yes Historical Provider, MD  ranitidine (ZANTAC) 300 MG tablet Take 300 mg by mouth at bedtime.   Yes Historical Provider, MD  sodium chloride (OCEAN) 0.65 % SOLN nasal spray Place 2 sprays into both nostrils 4 (four) times daily as needed for congestion.   Yes Historical Provider, MD  sodium chloride 1 G tablet Take 1 g by mouth 2 (two) times daily with a meal.   Yes Historical Provider, MD  traZODone (DESYREL)  50 MG tablet Take 50 mg by mouth at bedtime.   Yes Historical Provider, MD  verapamil (VERELAN PM) 240 MG 24 hr capsule Take 240 mg by mouth daily at 12 noon.   Yes Historical Provider, MD  vitamin C (ASCORBIC ACID) 500 MG tablet Take 500 mg by mouth daily.   Yes Historical Provider, MD      VITAL SIGNS:  Blood pressure 168/100, pulse 88, temperature 99.3 F (37.4 C), temperature source Oral, resp. rate 20, SpO2 98 %.  PHYSICAL EXAMINATION:  GENERAL:   81 y.o.-year-old patient lying in the bed with no acute distress.  EYES: Pupils equal, round, reactive to light and accommodation. No scleral icterus. Marland Kitchen.  HEENT: Head atraumatic, normocephalic. Oropharynx and nasopharynx clear.  NECK:  Supple, no jugular venous distention. No thyroid enlargement, no tenderness.  LUNGS: Normal breath sounds bilaterally, no wheezing, rales,rhonchi or crepitation. No use of accessory muscles of respiration.  CARDIOVASCULAR: S1, S2 normal. No murmurs, rubs, or gallops.  ABDOMEN: Soft, nontender, nondistended. Bowel sounds present. No organomegaly or mass.  EXTREMITIES: No pedal edema, cyanosis, or clubbing. Right lower extremity is tender with external rotation NEUROLOGIC: Patient is demented Gait not checked.  PSYCHIATRIC: The patient is alert and oriented x 1 SKIN: No obvious rash, lesion, or ulcer.   LABORATORY PANEL:   CBC  Recent Labs Lab 10/20/14 0944  WBC 10.6  HGB 11.1*  HCT 32.9*  PLT 150   ------------------------------------------------------------------------------------------------------------------  Chemistries   Recent Labs Lab 10/20/14 0944  NA 134*  K 4.2  CL 100*  CO2 25  GLUCOSE 162*  BUN 14  CREATININE 1.03*  CALCIUM 8.9   ------------------------------------------------------------------------------------------------------------------  Cardiac Enzymes No results for input(s): TROPONINI in the last 168 hours. ------------------------------------------------------------------------------------------------------------------  RADIOLOGY:  Dg Chest 1 View  10/20/2014  CLINICAL DATA:  Unwitnessed fall, dementia EXAM: CHEST 1 VIEW COMPARISON:  09/08/2013 FINDINGS: The heart size and mediastinal contours are within normal limits. Both lungs are clear. The visualized skeletal structures are unremarkable. IMPRESSION: No active disease. Electronically Signed   By: Charlett NoseKevin  Dover M.D.   On: 10/20/2014 09:45   Dg Thoracic Spine 2  View  10/20/2014  CLINICAL DATA:  Mid/lower back pain. Fall yesterday. Initial encounter. EXAM: THORACIC SPINE 2 VIEWS COMPARISON:  Chest CT 05/29/2013 FINDINGS: There are 12 full sized pairs of ribs. Mild S-shaped thoracolumbar scoliosis is again seen, convex to the right in the mid thoracic spine. There is no listhesis. Thoracic vertebral body heights are preserved without evidence of compression fracture. Mild thoracic spondylosis is present. The bones are diffusely osteopenic. Atherosclerotic vascular calcification is noted. IMPRESSION: 1. No acute osseous abnormality identified in the thoracic spine. 2. Mild thoracic scoliosis and spondylosis. Electronically Signed   By: Sebastian AcheAllen  Grady M.D.   On: 10/20/2014 09:45   Dg Lumbar Spine 2-3 Views  10/20/2014  CLINICAL DATA:  Status post fall EXAM: LUMBAR SPINE - 2-3 VIEW COMPARISON:  10/20/2012 FINDINGS: There is a chronic L2 vertebral body compression fracture unchanged compared with 10/20/2012. The remainder the vertebral body heights are maintained. Alignment is normal. There is degenerative disc disease with disc height loss at L4-5. There is diffuse bilateral facet arthropathy throughout the lumbar spine. There is thoracic aortic atherosclerosis. IMPRESSION: 1. No acute osseous injury of the lumbar spine. 2. Chronic L2 vertebral body compression fracture. Electronically Signed   By: Elige KoHetal  Patel   On: 10/20/2014 09:40   Dg Hip Unilat With Pelvis 2-3 Views Right  10/20/2014  CLINICAL DATA:  Fall yesterday. Shortened right leg. Right hip pain. EXAM: DG HIP (WITH OR WITHOUT PELVIS) 2-3V RIGHT COMPARISON:  None FINDINGS: There is a a right femoral neck fracture, likely intertrochanteric. Varus angulation. No subluxation or dislocation. Mild symmetric degenerative changes in the hips bilaterally. IMPRESSION: Right proximal femoral intertrochanteric fracture with varus angulation. Electronically Signed   By: Charlett Nose M.D.   On: 10/20/2014 09:44    EKG:    Orders placed or performed during the hospital encounter of 10/20/14  . EKG 12-Lead  . EKG 12-Lead    IMPRESSION AND PLAN:  Verneta Hamidi  is a 81 y.o. female with a known history of diverticular disease, hypertension, hypothyroidism and dementia is a resident at [facility] memory unit and is brought into the ED after she had an un-witnessed fall yesterday evening. In the ED x-rays revealed right intertrochanteric fracture-patient was seen by orthopedics Dr. Hyacinth Meeker   #1Displaced intertrochanteric fracture right hip  Scheduled for surgery for tomorrow a.m. Follow-up with orthopedics-Dr. Hyacinth Meeker Pain management by ortho  #Acute cystitis Abnormal urinalysis Follow-up on the urine culture and sensitivity Started patient on IV Rocephin empirically  #3 hypertension Blood pressure is elevated as patient did not take her home medication Resume her home medication verapamil and titrate as needed basis  #4 diabetes mellitus Hold her home medication metformin and provide sliding scale insulin Check hemoglobin A1c in a.m. Provide diabetic diet  #5 hypothyroidism Continue Synthroid home medication  #6 chronic dementia Currently patient is mentating okay Resume her home medications  #7 anxiety Resume home medications Xanax   DVT prophylaxis with SCDs  All the records are reviewed and case discussed with ED provider. Management plans discussed with the patient, family and they are in agreement.  CODE STATUS: DO NOT RESUSCITATE, daughter is the healthcare power of attorney  TOTAL TIME TAKING CARE OF THIS PATIENT: 45 minutes.    Ramonita Lab M.D on 10/20/2014 at 12:53 PM  Between 7am to 6pm - Pager - [phone]  After 6pm go to [email or web address] - password [email or web address] [facility]  [facility]  Office  [phone]  CC: Primary care physician; No primary care provider on file.

## 2014-10-20 NOTE — ED Notes (Addendum)
Patient transported to X-ray via Doctor, general practicestretcher with daughter

## 2014-10-20 NOTE — Progress Notes (Signed)
Patient alert to self. Patient demonstrates correct use of inventive spirometer with teach back. Plan of care discussed with patient.

## 2014-10-20 NOTE — ED Notes (Signed)
Pt fell yesterday at 4pm, R hip rotation, shortening of R leg, possible fracture

## 2014-10-20 NOTE — ED Provider Notes (Signed)
[facility] Emergency Department Provider Note   ____________________________________________  Time seen: 8:45 AM I have reviewed the triage vital signs and the triage nursing note.  HISTORY  Chief Complaint Hip Injury   Historian Limited historian, patient has dementia. Some history provided by family members, daughter  HPI Gina [name] is a 81 y.o. female who [facility], who reportedly had an unwitnessed fall yesterday in the evening. Because she was not complaining of any pain that she was placed back in her bed. When family witnessed her this morning she seemed to have some discomfort and her right leg was shortened. Family reports there is no altered mental status or change in her mental status from baseline.    Past Medical History  Diagnosis Date  . Diabetes mellitus without complication (HCC)   . Hypertension   . Dementia   . Anxiety   . GERD (gastroesophageal reflux disease)   . Osteoporosis   . Hypothyroidism     Patient Active Problem List   Diagnosis Date Noted  . HYPOTHYROIDISM 04/10/2006  . DIABETES MELLITUS, WITH GASTROPARESIS 04/10/2006  . HYPERCHOLESTEROLEMIA 04/10/2006  . DEMENTIA 04/10/2006  . ANXIETY 04/10/2006  . HYPERTENSION 04/10/2006  . ATHEROSCLEROTIC CARDIOVASCULAR DISEASE 04/10/2006  . GERD 04/10/2006  . CONSTIPATION 04/10/2006  . DIABETES MELLITUS, TYPE II 05/09/2005    History reviewed. No pertinent past surgical history.  Current Outpatient Rx  Name  Route  Sig  Dispense  Refill  . ALPRAZolam (XANAX) 0.25 MG tablet   Oral   Take 0.25 mg by mouth daily at 12 noon.         Marland Kitchen. ALPRAZolam (XANAX) 0.25 MG tablet   Oral   Take 0.25 mg by mouth every morning.         Marland Kitchen. ALPRAZolam (XANAX) 0.5 MG tablet   Oral   Take 0.5 mg by mouth at bedtime.         Marland Kitchen. atorvastatin (LIPITOR) 10 MG tablet   Oral   Take 10 mg by mouth daily.         Marland Kitchen. levothyroxine  (SYNTHROID, LEVOTHROID) 137 MCG tablet   Oral   Take 137 mcg by mouth daily before breakfast.         . loperamide (IMODIUM) 2 MG capsule   Oral   Take 2 mg by mouth as needed for diarrhea or loose stools.         . metFORMIN (GLUCOPHAGE) 500 MG tablet   Oral   Take 500 mg by mouth 2 (two) times daily with a meal.         . neomycin-bacitracin-polymyxin (NEOSPORIN) ointment   Topical   Apply 1 application topically as needed for wound care. apply to eye         . nystatin-triamcinolone (MYCOLOG II) cream   Topical   Apply 1 application topically as needed.         Marland Kitchen. omeprazole (PRILOSEC) 20 MG capsule   Oral   Take 20 mg by mouth daily.         . ranitidine (ZANTAC) 300 MG tablet   Oral   Take 300 mg by mouth at bedtime.         . sodium chloride (OCEAN) 0.65 % SOLN nasal spray   Each Nare   Place 2 sprays into both nostrils 4 (four) times daily as needed for congestion.         . sodium chloride 1  G tablet   Oral   Take 1 g by mouth 2 (two) times daily with a meal.         . traZODone (DESYREL) 50 MG tablet   Oral   Take 50 mg by mouth at bedtime.         . verapamil (VERELAN PM) 240 MG 24 hr capsule   Oral   Take 240 mg by mouth daily at 12 noon.         . vitamin C (ASCORBIC ACID) 500 MG tablet   Oral   Take 500 mg by mouth daily.           Allergies Fosamax  No family history on file.  Social History Social History  Substance Use Topics  . Smoking status: Never Smoker   . Smokeless tobacco: Never Used  . Alcohol Use: No    Review of Systems   Constitutional:  Eyes:  ENT:  Cardiovascular: No chest pain Respiratory: No trouble breathing Gastrointestinal:  Genitourinary:  Musculoskeletal:  Skin:  Neurological:    ____________________________________________   PHYSICAL EXAM:  VITAL SIGNS: ED Triage Vitals  Enc Vitals Group     BP 10/20/14 0826 190/97 mmHg     Pulse Rate 10/20/14 0826 83     Resp 10/20/14  0826 23     Temp 10/20/14 0826 99.3 F (37.4 C)     Temp Source 10/20/14 0826 Oral     SpO2 10/20/14 0826 98 %     Weight --      Height --      Head Cir --      Peak Flow --      Pain Score 10/20/14 0816 0     Pain Loc --      Pain Edu? --      Excl. in GC? --      Constitutional: Alert and cooperative, but disoriented. Well appearing and in no distress. Eyes: Conjunctivae are normal. PERRL. Normal extraocular movements. ENT   Head: Normocephalic and atraumatic.   Nose: No congestion/rhinnorhea.   Mouth/Throat: Mucous membranes are moist.   Neck: No stridor. Cardiovascular/Chest: Normal rate, regular rhythm.  No murmurs, rubs, or gallops. Respiratory: Normal respiratory effort without tachypnea nor retractions. Breath sounds are clear and equal bilaterally. No wheezes/rales/rhonchi. Gastrointestinal: Soft. No distention, no guarding, no rebound. Mild to moderate tenderness in the left lower abdomen.  Genitourinary/rectal:Deferred Musculoskeletal: Tender right hip. Right foot externally rotated and shortened.  NV intact Neurologic:  Confused, but cooperative.  No gross neurologic deficits. Skin:  Skin is warm, dry and intact. No rash noted.   ____________________________________________   EKG I, Governor Rooks, MD, the attending physician have personally viewed and interpreted all ECGs.  A beats per minute. Normal sinus rhythm. Narrow QRS. Normal axis. Nonspecific ST and T-wave. ____________________________________________  LABS (pertinent positives/negatives)  Sodium 134, creatinine 1.03, glucose 152 White blood count 10.6 with mild left shift. Hemoglobin 11.1 and platelet count 150  ____________________________________________  RADIOLOGY All Xrays were viewed by me. Imaging interpreted by Radiologist.  Chest x-ray 1 view: Negative Right hip 2 view with pelvis: Right intertrochanteric fracture  Lumbar spine and thoracic spine 2 view: Chronic L2  fracture and otherwise no acute finding __________________________________________  PROCEDURES  Procedure(s) performed: None  Critical Care performed: None  ____________________________________________   ED COURSE / ASSESSMENT AND PLAN  CONSULTATIONS: Patient was consultation with Dr. Hyacinth Meeker, of orthopedics. Plans for surgical repair, likely tomorrow. Phone consultation with hospitalist for admission.  Pertinent  labs & imaging results that were available during my care of the patient were reviewed by me and considered in my medical decision making (see chart for details).   Clinically concerning for right hip fracture, however stable and neurovascularly intact.  No altered mental status or neurologic deficit, and family does not feel they would like to proceed with any sort of head imaging at this point time. The patient does not take any blood thinners.  Confirmed right hip fracture. Consulted with Dr. Hyacinth Meeker for surgical repair, and admitted to medicine.  Patient / Family / Caregiver informed of clinical course, medical decision-making process, and agree with plan.   I discussed return precautions, follow-up instructions, and discharged instructions with patient and/or family.  ___________________________________________   FINAL CLINICAL IMPRESSION(S) / ED DIAGNOSES   Final diagnoses:  Closed right hip fracture, initial encounter (HCC)       Governor Rooks, MD 10/20/14 1055

## 2014-10-20 NOTE — ED Notes (Signed)
EMS CBG 187  Pt has had no morning meds, per EMS

## 2014-10-21 ENCOUNTER — Encounter: Payer: Self-pay | Admitting: Anesthesiology

## 2014-10-21 ENCOUNTER — Inpatient Hospital Stay: Payer: Medicare HMO | Admitting: Anesthesiology

## 2014-10-21 ENCOUNTER — Encounter
Admission: EM | Disposition: A | Discharge status: Skilled Nursing Facility | Payer: Self-pay | Source: Home / Self Care | Attending: Internal Medicine

## 2014-10-21 ENCOUNTER — Inpatient Hospital Stay: Payer: Medicare HMO

## 2014-10-21 HISTORY — PX: INTRAMEDULLARY (IM) NAIL INTERTROCHANTERIC: SHX5875

## 2014-10-21 LAB — CBC
HCT: 29.3 % — ABNORMAL LOW (ref 35.0–47.0)
HEMOGLOBIN: 9.9 g/dL — AB (ref 12.0–16.0)
MCH: 31.6 pg (ref 26.0–34.0)
MCHC: 33.6 g/dL (ref 32.0–36.0)
MCV: 94.1 fL (ref 80.0–100.0)
Platelets: 136 10*3/uL — ABNORMAL LOW (ref 150–440)
RBC: 3.12 MIL/uL — ABNORMAL LOW (ref 3.80–5.20)
RDW: 13.4 % (ref 11.5–14.5)
WBC: 12.5 10*3/uL — ABNORMAL HIGH (ref 3.6–11.0)

## 2014-10-21 LAB — BASIC METABOLIC PANEL
Anion gap: 5 (ref 5–15)
BUN: 14 mg/dL (ref 6–20)
CHLORIDE: 100 mmol/L — AB (ref 101–111)
CO2: 26 mmol/L (ref 22–32)
CREATININE: 0.93 mg/dL (ref 0.44–1.00)
Calcium: 8.5 mg/dL — ABNORMAL LOW (ref 8.9–10.3)
GFR calc Af Amer: 59 mL/min — ABNORMAL LOW (ref >60)
GFR calc non Af Amer: 51 mL/min — ABNORMAL LOW (ref >60)
GLUCOSE: 164 mg/dL — AB (ref 65–99)
POTASSIUM: 4.1 mmol/L (ref 3.5–5.1)
SODIUM: 131 mmol/L — AB (ref 135–145)

## 2014-10-21 LAB — GLUCOSE, CAPILLARY
Glucose-Capillary: 145 mg/dL — ABNORMAL HIGH (ref 65–99)
Glucose-Capillary: 156 mg/dL — ABNORMAL HIGH (ref 65–99)
Glucose-Capillary: 189 mg/dL — ABNORMAL HIGH (ref 65–99)
Glucose-Capillary: 199 mg/dL — ABNORMAL HIGH (ref 65–99)

## 2014-10-21 LAB — HEMOGLOBIN A1C: HEMOGLOBIN A1C: 6.1 % — AB (ref 4.0–6.0)

## 2014-10-21 LAB — VITAMIN D 25 HYDROXY (VIT D DEFICIENCY, FRACTURES): Vit D, 25-Hydroxy: 13.3 ng/mL — ABNORMAL LOW (ref 30.0–100.0)

## 2014-10-21 SURGERY — FIXATION, FRACTURE, INTERTROCHANTERIC, WITH INTRAMEDULLARY ROD
Anesthesia: Spinal | Laterality: Right

## 2014-10-21 MED ORDER — BUPIVACAINE HCL (PF) 0.5 % IJ SOLN
INTRAMUSCULAR | Status: DC | PRN
  Administered 2014-10-21: 2.5 mL

## 2014-10-21 MED ORDER — METOCLOPRAMIDE HCL 5 MG PO TABS: 5.0000 mg | ORAL_TABLET | Freq: Three times a day (TID) | ORAL | Status: DC | PRN

## 2014-10-21 MED ORDER — ONDANSETRON HCL 4 MG/2ML IJ SOLN: 4.0000 mg | Freq: Four times a day (QID) | INTRAMUSCULAR | Status: DC | PRN

## 2014-10-21 MED ORDER — ZOLPIDEM TARTRATE 5 MG PO TABS: 5.0000 mg | ORAL_TABLET | Freq: Every evening | ORAL | Status: DC | PRN

## 2014-10-21 MED ORDER — SENNA 8.6 MG PO TABS
1.0000 | ORAL_TABLET | Freq: Two times a day (BID) | ORAL | Status: DC
  Administered 2014-10-21 – 2014-10-25 (×9): 8.6 mg via ORAL
  Filled 2014-10-21 (×9): qty 1

## 2014-10-21 MED ORDER — ALPRAZOLAM 0.5 MG PO TABS
0.5000 mg | ORAL_TABLET | Freq: Every day | ORAL | Status: DC
  Administered 2014-10-21 – 2014-10-23 (×3): 0.5 mg via ORAL
  Filled 2014-10-21 (×4): qty 1

## 2014-10-21 MED ORDER — TRAZODONE HCL 50 MG PO TABS
50.0000 mg | ORAL_TABLET | Freq: Every day | ORAL | Status: DC
  Administered 2014-10-21 – 2014-10-24 (×4): 50 mg via ORAL
  Filled 2014-10-21 (×4): qty 1

## 2014-10-21 MED ORDER — MENTHOL 3 MG MT LOZG
1.0000 | LOZENGE | OROMUCOSAL | Status: DC | PRN
  Filled 2014-10-21: qty 9

## 2014-10-21 MED ORDER — MORPHINE SULFATE (PF) 2 MG/ML IV SOLN
0.5000 mg | INTRAVENOUS | Status: DC | PRN
  Administered 2014-10-21: 0.5 mg via INTRAVENOUS
  Filled 2014-10-21: qty 1

## 2014-10-21 MED ORDER — ONDANSETRON HCL 4 MG PO TABS: 4.0000 mg | ORAL_TABLET | Freq: Four times a day (QID) | ORAL | Status: DC | PRN

## 2014-10-21 MED ORDER — CLINDAMYCIN PHOSPHATE 600 MG/50ML IV SOLN
600.0000 mg | Freq: Three times a day (TID) | INTRAVENOUS | Status: AC
  Administered 2014-10-21 – 2014-10-22 (×3): 600 mg via INTRAVENOUS
  Filled 2014-10-21 (×3): qty 50

## 2014-10-21 MED ORDER — ALPRAZOLAM 0.25 MG PO TABS
0.2500 mg | ORAL_TABLET | Freq: Two times a day (BID) | ORAL | Status: DC
  Administered 2014-10-22 – 2014-10-25 (×6): 0.25 mg via ORAL
  Filled 2014-10-21 (×6): qty 1

## 2014-10-21 MED ORDER — ENOXAPARIN SODIUM 40 MG/0.4ML ~~LOC~~ SOLN
40.0000 mg | SUBCUTANEOUS | Status: DC
  Administered 2014-10-22 – 2014-10-25 (×4): 40 mg via SUBCUTANEOUS
  Filled 2014-10-21 (×4): qty 0.4

## 2014-10-21 MED ORDER — LACTATED RINGERS IV SOLN
INTRAVENOUS | Status: DC | PRN
  Administered 2014-10-21: 10:00:00 via INTRAVENOUS

## 2014-10-21 MED ORDER — SODIUM CHLORIDE 0.45 % IV SOLN
INTRAVENOUS | Status: DC
  Administered 2014-10-21: 15:00:00 via INTRAVENOUS

## 2014-10-21 MED ORDER — PROPOFOL 10 MG/ML IV BOLUS
INTRAVENOUS | Status: DC | PRN
  Administered 2014-10-21: 40 mg via INTRAVENOUS

## 2014-10-21 MED ORDER — PHENYLEPHRINE HCL 10 MG/ML IJ SOLN
INTRAMUSCULAR | Status: DC | PRN
  Administered 2014-10-21 (×4): 100 ug via INTRAVENOUS

## 2014-10-21 MED ORDER — PROPOFOL 500 MG/50ML IV EMUL
INTRAVENOUS | Status: DC | PRN
  Administered 2014-10-21: 20 ug/kg/min via INTRAVENOUS
  Administered 2014-10-21: 10 ug/kg/min via INTRAVENOUS

## 2014-10-21 MED ORDER — NEOMYCIN-POLYMYXIN B GU 40-200000 IR SOLN
Status: DC | PRN
  Administered 2014-10-21: 2 mL

## 2014-10-21 MED ORDER — MAGNESIUM HYDROXIDE 400 MG/5ML PO SUSP
30.0000 mL | Freq: Every day | ORAL | Status: DC | PRN
Start: 2014-10-21 — End: 2014-10-25

## 2014-10-21 MED ORDER — ACETAMINOPHEN 325 MG PO TABS
650.0000 mg | ORAL_TABLET | Freq: Four times a day (QID) | ORAL | Status: DC | PRN
  Administered 2014-10-22: 650 mg via ORAL
  Filled 2014-10-21 (×2): qty 2

## 2014-10-21 MED ORDER — ACETAMINOPHEN 500 MG PO TABS
1000.0000 mg | ORAL_TABLET | Freq: Four times a day (QID) | ORAL | Status: AC
  Administered 2014-10-21 – 2014-10-22 (×3): 1000 mg via ORAL
  Filled 2014-10-21 (×4): qty 2

## 2014-10-21 MED ORDER — ALUM & MAG HYDROXIDE-SIMETH 200-200-20 MG/5ML PO SUSP
30.0000 mL | ORAL | Status: DC | PRN
Start: 2014-10-21 — End: 2014-10-25

## 2014-10-21 MED ORDER — HYDROCODONE-ACETAMINOPHEN 5-325 MG PO TABS: 1.0000 | ORAL_TABLET | Freq: Four times a day (QID) | ORAL | Status: DC | PRN

## 2014-10-21 MED ORDER — ONDANSETRON HCL 4 MG/2ML IJ SOLN: 4.0000 mg | Freq: Once | INTRAMUSCULAR | Status: DC | PRN

## 2014-10-21 MED ORDER — CEFAZOLIN SODIUM-DEXTROSE 2-3 GM-% IV SOLR
2.0000 g | Freq: Four times a day (QID) | INTRAVENOUS | Status: AC
  Administered 2014-10-21 (×2): 2 g via INTRAVENOUS
  Filled 2014-10-21 (×2): qty 50

## 2014-10-21 MED ORDER — BUPIVACAINE-EPINEPHRINE (PF) 0.25% -1:200000 IJ SOLN
INTRAMUSCULAR | Status: AC
  Filled 2014-10-21: qty 30

## 2014-10-21 MED ORDER — FERROUS SULFATE 325 (65 FE) MG PO TABS
325.0000 mg | ORAL_TABLET | Freq: Every day | ORAL | Status: DC
  Administered 2014-10-22 – 2014-10-25 (×4): 325 mg via ORAL
  Filled 2014-10-21 (×4): qty 1

## 2014-10-21 MED ORDER — FLEET ENEMA 7-19 GM/118ML RE ENEM: 1.0000 | ENEMA | Freq: Once | RECTAL | Status: DC | PRN

## 2014-10-21 MED ORDER — NEOMYCIN-POLYMYXIN B GU 40-200000 IR SOLN
Status: AC
  Filled 2014-10-21: qty 2

## 2014-10-21 MED ORDER — BUPIVACAINE-EPINEPHRINE (PF) 0.25% -1:200000 IJ SOLN
INTRAMUSCULAR | Status: DC | PRN
  Administered 2014-10-21: 30 mL

## 2014-10-21 MED ORDER — BISACODYL 10 MG RE SUPP
10.0000 mg | Freq: Every day | RECTAL | Status: DC | PRN
  Administered 2014-10-24: 10 mg via RECTAL
  Filled 2014-10-21: qty 1

## 2014-10-21 MED ORDER — PHENOL 1.4 % MT LIQD
1.0000 | OROMUCOSAL | Status: DC | PRN
  Filled 2014-10-21: qty 177

## 2014-10-21 MED ORDER — METOCLOPRAMIDE HCL 5 MG/ML IJ SOLN: 5.0000 mg | Freq: Three times a day (TID) | INTRAMUSCULAR | Status: DC | PRN

## 2014-10-21 MED ORDER — FENTANYL CITRATE (PF) 100 MCG/2ML IJ SOLN
INTRAMUSCULAR | Status: DC | PRN
  Administered 2014-10-21: 20 ug via INTRAVENOUS
  Administered 2014-10-21: 25 ug via INTRAVENOUS

## 2014-10-21 MED ORDER — FENTANYL CITRATE (PF) 100 MCG/2ML IJ SOLN: 25.0000 ug | INTRAMUSCULAR | Status: DC | PRN

## 2014-10-21 MED ORDER — ACETAMINOPHEN 650 MG RE SUPP: 650.0000 mg | Freq: Four times a day (QID) | RECTAL | Status: DC | PRN

## 2014-10-21 SURGICAL SUPPLY — 36 items
BIT DRILL GRAY 5X250 (MISCELLANEOUS) ×3 IMPLANT
BIT DRILL RADIO 4.0 (BIT) ×1 IMPLANT
BLADE HELICAL 95 (Orthopedic Implant) ×1 IMPLANT
BLADE HELICAL 95MM (Orthopedic Implant) ×1 IMPLANT
BNDG COHESIVE 6X5 TAN STRL LF (GAUZE/BANDAGES/DRESSINGS) ×2 IMPLANT
CANISTER SUCT 1200ML W/VALVE (MISCELLANEOUS) ×3 IMPLANT
CHLORAPREP W/TINT 26ML (MISCELLANEOUS) ×6 IMPLANT
DRAPE INCISE 23X17 IOBAN STRL (DRAPES) ×2
DRAPE INCISE IOBAN 23X17 STRL (DRAPES) ×1 IMPLANT
DRSG AQUACEL AG ADV 3.5X 4 (GAUZE/BANDAGES/DRESSINGS) ×2 IMPLANT
DRSG AQUACEL AG ADV 3.5X10 (GAUZE/BANDAGES/DRESSINGS) ×5 IMPLANT
GAUZE PETRO XEROFOAM 1X8 (MISCELLANEOUS) ×3 IMPLANT
GAUZE SPONGE 4X4 12PLY STRL (GAUZE/BANDAGES/DRESSINGS) ×1 IMPLANT
GLOVE BIO SURGEON STRL SZ7 (GLOVE) ×4 IMPLANT
GLOVE BIOGEL PI IND STRL 7.5 (GLOVE) IMPLANT
GLOVE BIOGEL PI INDICATOR 7.5 (GLOVE) ×4
GLOVE INDICATOR 8.0 STRL GRN (GLOVE) ×3 IMPLANT
GLOVE SURG ORTHO 8.5 STRL (GLOVE) ×3 IMPLANT
GOWN STRL REUS W/ TWL LRG LVL3 (GOWN DISPOSABLE) ×3 IMPLANT
GOWN STRL REUS W/TWL LRG LVL3 (GOWN DISPOSABLE) ×9
INSERT LCS COM RP STDPLUS 17.5 (Knees) ×2 IMPLANT
KIT RM TURNOVER STRD PROC AR (KITS) ×3 IMPLANT
MAT BLUE FLOOR 46X72 FLO (MISCELLANEOUS) ×3 IMPLANT
NAIL TROCH FX 11/130D 170-S (Nail) ×3 IMPLANT
NDL SPNL 18GX3.5 QUINCKE PK (NEEDLE) ×1 IMPLANT
NEEDLE SPNL 18GX3.5 QUINCKE PK (NEEDLE) ×3 IMPLANT
NS IRRIG 500ML POUR BTL (IV SOLUTION) ×3 IMPLANT
PACK HIP COMPR (MISCELLANEOUS) ×3 IMPLANT
PAD GROUND ADULT SPLIT (MISCELLANEOUS) ×3 IMPLANT
REAMER ROD DEEP FLUTE 2.5X950 (INSTRUMENTS) ×5 IMPLANT
STAPLER SKIN PROX 35W (STAPLE) ×3 IMPLANT
SUCTION FRAZIER TIP 10 FR DISP (SUCTIONS) ×3 IMPLANT
SUT VIC AB 0 CT1 36 (SUTURE) ×3 IMPLANT
SUT VIC AB 2-0 CT1 27 (SUTURE) ×3
SUT VIC AB 2-0 CT1 TAPERPNT 27 (SUTURE) ×1 IMPLANT
SYR 30ML LL (SYRINGE) ×3 IMPLANT

## 2014-10-21 NOTE — Transfer of Care (Signed)
Immediate Anesthesia Transfer of Care Note  Patient: Gina [name]  Procedure(s) Performed: Procedure(s): INTRAMEDULLARY (IM) NAIL INTERTROCHANTRIC (Right)  Patient Location: PACU  Anesthesia Type:Spinal  Level of Consciousness: awake, alert  and oriented  Airway & Oxygen Therapy: Patient Spontanous Breathing and Patient connected to nasal cannula oxygen  Post-op Assessment: Report given to RN and Post -op Vital signs reviewed and stable  Post vital signs: Reviewed and stable  Last Vitals:  Filed Vitals:   10/21/14 1204  BP: 107/49  Pulse: 73  Temp: 36.4 C  Resp: 18    Complications: No apparent anesthesia complications

## 2014-10-21 NOTE — Progress Notes (Signed)
Pharmacy Note - IV Methocarbamol  Estimated Creatinine Clearance: 36.8 mL/min (by C-G formula based on Cr of 0.93).   Orders for IV methocarbamol discontinued due to patient's renal function. IV formulation is contraindicated in patients with renal impairment.  Garlon HatchetJody Jacquiline Zurcher, PharmD Clinical Pharmacist  10/21/2014 9:21 AM

## 2014-10-21 NOTE — Progress Notes (Signed)
[facility] [place] at [facility]   PATIENT NAME: Gina [name]    MR#:  [id number]  DATE OF BIRTH:  [birth year]  SUBJECTIVE:  CHIEF COMPLAINT:   Chief Complaint  Patient presents with  . Hip Injury  came after a fall and fracture right hip. S/p surgery for right side hip fracture- 10/21/14 REVIEW OF SYSTEMS:  CONSTITUTIONAL: No fever, fatigue or weakness.  EYES: No blurred or double vision.  EARS, NOSE, AND THROAT: No tinnitus or ear pain.  RESPIRATORY: No cough, shortness of breath, wheezing or hemoptysis.  CARDIOVASCULAR: No chest pain, orthopnea, edema.  GASTROINTESTINAL: No nausea, vomiting, diarrhea or abdominal pain.  GENITOURINARY: No dysuria, hematuria.  ENDOCRINE: No polyuria, nocturia,  HEMATOLOGY: No anemia, easy bruising or bleeding SKIN: No rash or lesion. MUSCULOSKELETAL: right hip joint pain or arthritis.   NEUROLOGIC: No tingling, numbness, weakness.  PSYCHIATRY: No anxiety or depression.   ROS  DRUG ALLERGIES:   Allergies  Allergen Reactions  . Fosamax [Alendronate Sodium] Other (See Comments)    Unknown    VITALS:  Blood pressure 124/58, pulse 72, temperature 96.5 F (35.8 C), temperature source Oral, resp. rate 18, height 5\' 5"  (1.651 m), weight 72.122 kg (159 lb), SpO2 96 %.  PHYSICAL EXAMINATION:  GENERAL:  81 y.o.-year-old patient lying in the bed with no acute distress.  EYES: Pupils equal, round, reactive to light and accommodation. No scleral icterus. Extraocular muscles intact.  HEENT: Head atraumatic, normocephalic. Oropharynx and nasopharynx clear.  NECK:  Supple, no jugular venous distention. No thyroid enlargement, no tenderness.  LUNGS: Normal breath sounds bilaterally, no wheezing, rales,rhonchi or crepitation. No use of accessory muscles of respiration.  CARDIOVASCULAR: S1, S2 normal. No murmurs, rubs, or gallops.  ABDOMEN: Soft, nontender, nondistended. Bowel sounds present. No organomegaly or mass.   EXTREMITIES: No pedal edema, cyanosis, or clubbing. Right hip dressing present. NEUROLOGIC: Cranial nerves II through XII are intact. Muscle strength 4/5 in all extremities. Sensation intact. Gait not checked.  PSYCHIATRIC: The patient is alert and oriented x 3.  SKIN: No obvious rash, on nose thee is a lesion which is elevated with rough margins and variation in colour present- size 0.5 cm.  Physical Exam LABORATORY PANEL:   CBC  Recent Labs Lab 10/21/14 0656  WBC 12.5*  HGB 9.9*  HCT 29.3*  PLT 136*   ------------------------------------------------------------------------------------------------------------------  Chemistries   Recent Labs Lab 10/21/14 0656  NA 131*  K 4.1  CL 100*  CO2 26  GLUCOSE 164*  BUN 14  CREATININE 0.93  CALCIUM 8.5*   ------------------------------------------------------------------------------------------------------------------  Cardiac Enzymes No results for input(s): TROPONINI in the last 168 hours. ------------------------------------------------------------------------------------------------------------------  RADIOLOGY:  Dg Chest 1 View  10/20/2014  CLINICAL DATA:  Unwitnessed fall, dementia EXAM: CHEST 1 VIEW COMPARISON:  09/08/2013 FINDINGS: The heart size and mediastinal contours are within normal limits. Both lungs are clear. The visualized skeletal structures are unremarkable. IMPRESSION: No active disease. Electronically Signed   By: Charlett NoseKevin  Dover M.D.   On: 10/20/2014 09:45   Dg Thoracic Spine 2 View  10/20/2014  CLINICAL DATA:  Mid/lower back pain. Fall yesterday. Initial encounter. EXAM: THORACIC SPINE 2 VIEWS COMPARISON:  Chest CT 05/29/2013 FINDINGS: There are 12 full sized pairs of ribs. Mild S-shaped thoracolumbar scoliosis is again seen, convex to the right in the mid thoracic spine. There is no listhesis. Thoracic vertebral body heights are preserved without evidence of compression fracture. Mild thoracic spondylosis  is present. The bones are diffusely osteopenic. Atherosclerotic  vascular calcification is noted. IMPRESSION: 1. No acute osseous abnormality identified in the thoracic spine. 2. Mild thoracic scoliosis and spondylosis. Electronically Signed   By: Sebastian Ache M.D.   On: 10/20/2014 09:45   Dg Lumbar Spine 2-3 Views  10/20/2014  CLINICAL DATA:  Status post fall EXAM: LUMBAR SPINE - 2-3 VIEW COMPARISON:  10/20/2012 FINDINGS: There is a chronic L2 vertebral body compression fracture unchanged compared with 10/20/2012. The remainder the vertebral body heights are maintained. Alignment is normal. There is degenerative disc disease with disc height loss at L4-5. There is diffuse bilateral facet arthropathy throughout the lumbar spine. There is thoracic aortic atherosclerosis. IMPRESSION: 1. No acute osseous injury of the lumbar spine. 2. Chronic L2 vertebral body compression fracture. Electronically Signed   By: Elige Ko   On: 10/20/2014 09:40   Dg Hip Operative Unilat With Pelvis Right  10/21/2014  CLINICAL DATA:  Right hip fracture EXAM: OPERATIVE RIGHT HIP (WITH PELVIS IF PERFORMED) 2 VIEWS TECHNIQUE: Fluoroscopic spot image(s) were submitted for interpretation post-operatively. COMPARISON:  10/20/2014 FINDINGS: There is a right femoral neck fracture. An intra medullary nail is partially visualized within the proximal right femur. Fluoroscopy time 0 minutes 50 seconds IMPRESSION: Intra medullary nail placement right femur Electronically Signed   By: Esperanza Heir M.D.   On: 10/21/2014 12:12   Dg Hip Unilat With Pelvis 2-3 Views Right  10/20/2014  CLINICAL DATA:  Fall yesterday. Shortened right leg. Right hip pain. EXAM: DG HIP (WITH OR WITHOUT PELVIS) 2-3V RIGHT COMPARISON:  None FINDINGS: There is a a right femoral neck fracture, likely intertrochanteric. Varus angulation. No subluxation or dislocation. Mild symmetric degenerative changes in the hips bilaterally. IMPRESSION: Right proximal femoral  intertrochanteric fracture with varus angulation. Electronically Signed   By: Charlett Nose M.D.   On: 10/20/2014 09:44    ASSESSMENT AND PLAN:   Active Problems:   Hip fracture (HCC)   Pressure ulcer  #1Displaced intertrochanteric fracture right hip  S/p surgery- 10/21/14 Follow-up with orthopedics-Dr. Hyacinth Meeker Pain management by ortho  PT and rehab placement.  #Acute cystitis Abnormal urinalysis Follow-up on the urine culture and sensitivity  patient on IV Rocephin empirically  #3 hypertension  home medication verapamil- stable.  #4 diabetes mellitus Hold her home medication metformin and provide sliding scale insulin Provide diabetic diet  #5 hypothyroidism Continue Synthroid home medication  #6 chronic dementia Currently patient is mentating okay Resume her home medications  #7 anxiety Resume home medications Xanax    All the records are reviewed and case discussed with Care Management/Social Workerr. Management plans discussed with the patient, family and they are in agreement.  CODE STATUS: DNR  TOTAL TIME TAKING CARE OF THIS PATIENT: 35 minutes.   POSSIBLE D/C IN 2-3 DAYS, DEPENDING ON CLINICAL CONDITION.   Altamese Dilling M.D on 10/21/2014   Between 7am to 6pm - Pager - [phone]  After 6pm go to [email or web address] - password [email or web address] [facility]  [place] [facility]  Office  [phone]  CC: Primary care physician; No primary care provider on file.  Note: This dictation was prepared with Dragon dictation along with smaller phrase technology. Any transcriptional errors that result from this process are unintentional.

## 2014-10-21 NOTE — Clinical Social Work Note (Signed)
Clinical Social Work Assessment  Patient Details  Name: Gina [name] MRN: [id number] Date of Birth: Feb 19, 1917  Date of referral:  10/21/14               Reason for consult:  Facility Placement, Other (Comment Required) (From [facility] )                Permission sought to share information with:  Oceanographeracility Contact Representative Permission granted to share information::  Yes, Verbal Permission Granted  Name::      Skilled Nursing Facility   Agency::   [place]   Relationship::     Contact Information:     Housing/Transportation Living arrangements for the past 2 months:  [facility]isted [facility] Facility Source of Information:  Power of McBeeAttorney, Adult Children Patient Interpreter Needed:  None Criminal Activity/Legal Involvement Pertinent to Current Situation/Hospitalization:  No - Comment as needed Significant Relationships:  Adult Children Lives with:  Facility Resident Do you feel safe going back to the place where you live?  Yes Need for family participation in patient care:  Yes (Comment)  Care giving concerns:  Patient is a resident at [facility].    Social Worker assessment / plan: Visual merchandiserClinical Social Worker (CSW) received SNF consult. Patient is having surgery today with Gina [name] for a hip fracture. CSW attempted to meet with patient and family prior to surgery however patient was already off the floor. CSW contacted patient's daughter Gina [name] HPOA [phone] to discuss D/C plan. Daughter reported that patient has lived at [facility] for 8 years and moved to [facility] last year. Patient has 2 daughter's Gina HatchAnn and [name]. Per Gina HatchAnn patient has a recent UTI that she believes has contributed to the fall. CSW discussed SNF and [facility] authorization for [facility]. Daughter verbalized her understanding. CSW explained that patient will have to participate in PT in order for [employer] to cover rehab. Daughter reported that patient was participating in PT at  [facility]. Daughter is agreeable to SNF search and prefers [facility] or [facility].   CSW contacted Gina [name] care coordinator at [facility]. Per Gina [name] patient was pleasant, walking around the community and doing well with PT. CSW made Gina [name] aware that patient will likely go to rehab for a few weeks and then return to [facility].   FL2 complete and faxed out.   Employment status:  Disabled (Comment on whether or not currently receiving Disability), Retired Database administratornsurance information:  Managed Medicare, [place] PT Recommendations:  Not assessed at this time Information / Referral to community resources:  Skilled Nursing Facility  Patient/Family's Response to care: Patient's daughter Gina Hatchnn is agreeable to SNF search and prefers [facility] or [facility].   Patient/Family's Understanding of and Emotional Response to Diagnosis, Current Treatment, and Prognosis: Daughter was pleasant and thanked CSW for assistance.   Emotional Assessment Appearance:  Appears stated age Attitude/Demeanor/Rapport:    Affect (typically observed):  Unable to Assess Orientation:  Fluctuating Orientation (Suspected and/or reported Sundowners), Oriented to Self Alcohol / Substance use:  Not Applicable Psych involvement (Current and /or in the community):  No (Comment)  Discharge Needs  Concerns to be addressed:  Discharge Planning Concerns Readmission within the last 30 days:  No Current discharge risk:  Cognitively Impaired Barriers to Discharge:  Continued Medical Work up   Gina [name], Gina Dudas G, LCSW 10/21/2014, 10:20 AM

## 2014-10-21 NOTE — Clinical Social Work Placement (Signed)
   CLINICAL SOCIAL WORK PLACEMENT  NOTE  Date:  10/21/2014  Patient Details  Name: Gina [name] MRN: [id number] Date of Birth: 1917/06/10  Clinical Social Work is seeking post-discharge placement for this patient at the Skilled  Nursing Facility level of care (*CSW will initial, date and re-position this form in  chart as items are completed):  Yes   Patient/family provided with [place] Clinical Social Work Department's list of facilities offering this level of care within the geographic area requested by the patient (or if unable, by the patient's family).  Yes   Patient/family informed of their freedom to choose among providers that offer the needed level of care, that participate in Medicare, Medicaid or managed care program needed by the patient, have an available bed and are willing to accept the patient.  Yes   Patient/family informed of [facility]'s ownership interest in [facility] and [facility], as well as of the fact that they are under no obligation to receive care at these facilities.  PASRR submitted to EDS on       PASRR number received on       Existing PASRR number confirmed on 10/21/14     FL2 transmitted to all facilities in geographic area requested by pt/family on 10/21/14     FL2 transmitted to all facilities within larger geographic area on       Patient informed that his/her managed care company has contracts with or will negotiate with certain facilities, including the following:            Patient/family informed of bed offers received.  Patient chooses bed at       Physician recommends and patient chooses bed at      Patient to be transferred to   on  .  Patient to be transferred to facility by       Patient family notified on   of transfer.  Name of family member notified:        PHYSICIAN Please sign FL2     Additional Comment:    _______________________________________________ Haig ProphetMorgan, Byrd Terrero G, LCSW 10/21/2014,  10:19 AM

## 2014-10-21 NOTE — Progress Notes (Signed)
Clinical Child psychotherapistocial Worker (CSW) presented bed offers to patient's daughter Dewayne Hatchnn. Daughter chose [facility]. Doug admissions coordinator at [facility] is aware of accepted offer and will start [facility] authorization. Plan is for patient to D/C to [facility] Sunday 10/23/14 pending medical clearance. CSW will continue to follow and assist as needed.   Jetta LoutBailey Morgan, LCSWA [phone]

## 2014-10-21 NOTE — Progress Notes (Signed)
Patient's dressing oozing blood upon assessment when returned to assigned room.  Dr. Hyacinth MeekerMiller notified and dressing reinforced via verbal order.

## 2014-10-21 NOTE — Op Note (Signed)
DATE OF SURGERY:  10/21/2014  TIME: 11:55 AM  PATIENT NAME:  Gina [name]  AGE: 81 y.o.  PRE-OPERATIVE DIAGNOSIS:   Right comminuted displaced intertrochanteric  hip fracture  POST-OPERATIVE DIAGNOSIS:  SAME  PROCEDURE:  INTRAMEDULLARY (IM) NAIL-- INTERTROCHANTRIC FIXATION NAIL  SURGEON:  Yolandra Habig E  EBL: 50 CC  COMPLICATIONS: None  OPERATIVE IMPLANTS: Synthes trochanteric femoral nail 130*/6311mm with interlocking helical blade 95mm  and distal locking screw 36mm.  PREOPERATIVE INDICATIONS:  Gina [name] is a 81 y.o. year old who fell and suffered a hip fracture. She was brought into the ER and then admitted and optimized and then elected for surgical intervention.    The risks benefits and alternatives were discussed with the patient including but not limited to the risks of nonoperative treatment, versus surgical intervention including infection, bleeding, nerve injury, malunion, nonunion, hardware prominence, hardware failure, need for hardware removal, blood clots, cardiopulmonary complications, morbidity, mortality, among others, and they were willing to proceed.    OPERATIVE PROCEDURE:  The patient was brought to the operating room and placed in the supine position. Spinal anesthesia was administered, with a foley. She was placed on the fracture table.  Closed reduction was performed under C-arm guidance. The length of the femur was also measured using fluoroscopy. Time out was then performed after sterile prep and drape. She received preoperative antibiotics.  Incision was made proximal to the greater trochanter. A guidewire was placed in the appropriate position. Confirmation was made on AP and lateral views. The above-named nail was opened. I opened the proximal femur with a reamer. I then placed the nail by hand easily down. I did not need to ream the femur.  Once the nail was completely seated, I placed a guidepin into the femoral head into the center center  position through a second incision.  I measured the length, and then reamed the lateral cortex and up into the head. I then placed the helical blade. Slight compression was applied. Anatomic fixation achieved. Bone quality was mediocre.  I then secured the proximal interlock.  The distal locking screw was then placed and after confirming the position of the fracture fragments and hardware I then removed the instruments, and took final C-arm pictures AP and lateral the entire length of the leg. Anatomic reconstruction was achieved, and the wounds were irrigated copiously and closed with Vicryl  followed by staples and Aquacel for the skin. Sponge and needle count were correct.   The patient was awakened and returned to PACU in stable and satisfactory condition. There no complications and the patient tolerated the procedure well.  She will be partial weightbearing as tolerated, and will be on Lovenox  For DVT prophylaxis.     Valinda HoarHoward E Shonya Sumida, M.D.

## 2014-10-21 NOTE — Anesthesia Procedure Notes (Signed)
Spinal Patient location during procedure: OR Start time: 10/21/2014 10:25 AM End time: 10/21/2014 10:43 AM Staffing Anesthesiologist: Martha Clan Resident/CRNA: Jonna Clark Performed by: anesthesiologist and resident/CRNA  Preanesthetic Checklist Completed: patient identified, site marked, surgical consent, pre-op evaluation, timeout performed, IV checked, risks and benefits discussed and monitors and equipment checked Spinal Block Patient position: right lateral decubitus Prep: Betadine Patient monitoring: heart rate, continuous pulse ox, blood pressure and cardiac monitor Approach: midline Location: L4-5 Injection technique: single-shot Needle Needle type: Whitacre and Introducer  Needle gauge: 25 G Needle length: 9 cm Additional Notes Negative paresthesia. Negative blood return. Positive free-flowing CSF. Expiration date of kit checked and confirmed. Patient tolerated procedure well, without complications.

## 2014-10-21 NOTE — Anesthesia Postprocedure Evaluation (Signed)
  Anesthesia Post-op Note  Patient: Gina BossLucille H Warda  Procedure(s) Performed: Procedure(s): INTRAMEDULLARY (IM) NAIL INTERTROCHANTRIC (Right)  Anesthesia type:Spinal  Patient location: PACU  Post pain: Pain level controlled  Post assessment: Post-op Vital signs reviewed, Patient's Cardiovascular Status Stable, Respiratory Function Stable, Patent Airway and No signs of Nausea or vomiting  Post vital signs: Reviewed and stable  Last Vitals:  Filed Vitals:   10/21/14 1510  BP: 124/58  Pulse: 72  Temp: 35.8 C  Resp: 18    Level of consciousness: awake, alert  and patient cooperative  Complications: No apparent anesthesia complications

## 2014-10-21 NOTE — Anesthesia Preprocedure Evaluation (Signed)
Anesthesia Evaluation  Patient identified by MRN, date of birth, ID band Patient awake    Reviewed: Allergy & Precautions, H&P , NPO status , Patient's Chart, lab work & pertinent test results, reviewed documented beta blocker date and time   History of Anesthesia Complications Negative for: history of anesthetic complications  Airway Mallampati: IV  TM Distance: >3 FB Neck ROM: full    Dental no notable dental hx. (+) Edentulous Upper, Edentulous Lower   Pulmonary neg pulmonary ROS,    Pulmonary exam normal breath sounds clear to auscultation       Cardiovascular Exercise Tolerance: Good hypertension, (-) angina(-) CAD, (-) Past MI, (-) Cardiac Stents and (-) CABG Normal cardiovascular exam(-) dysrhythmias (-) Valvular Problems/Murmurs Rhythm:regular Rate:Normal     Neuro/Psych Seizures - (from withdrawal of xanax),  PSYCHIATRIC DISORDERS (dementia)    GI/Hepatic Neg liver ROS, GERD  Medicated and Controlled,  Endo/Other  diabetes, Well Controlled, Oral Hypoglycemic AgentsHypothyroidism   Renal/GU negative Renal ROS  negative genitourinary   Musculoskeletal   Abdominal   Peds  Hematology negative hematology ROS (+)   Anesthesia Other Findings Past Medical History:   Diabetes mellitus without complication (HCC)                 Hypertension                                                 Dementia                                                     Anxiety                                                      GERD (gastroesophageal reflux disease)                       Osteoporosis                                                 Hypothyroidism                                               Reproductive/Obstetrics negative OB ROS                             Anesthesia Physical Anesthesia Plan  ASA: III  Anesthesia Plan: Spinal   Post-op Pain Management:    Induction:   Airway  Management Planned:   Additional Equipment:   Intra-op Plan:   Post-operative Plan:   Informed Consent: I have reviewed the patients History and Physical, chart, labs and discussed the procedure including the risks, benefits and alternatives for the proposed anesthesia with the patient or authorized representative  who has indicated his/her understanding and acceptance.   Dental Advisory Given  Plan Discussed with: Anesthesiologist, CRNA and Surgeon  Anesthesia Plan Comments:         Anesthesia Quick Evaluation

## 2014-10-21 NOTE — Care Management Note (Signed)
Case Management Note  Patient Details  Name: Aniceto BossLucille H Ritchie MRN: [id number] Date of Birth: October 04, 1917  Subjective/Objective:   Chart reviewed and case discussed with CSW. Patient from [facility]. Surgery today for right intertrochanteric fracture by Dr. Hyacinth MeekerMiller.                   Action/Plan: Plan is SNF at discharge  Expected Discharge Date:  10/24/14               Expected Discharge Plan:  Skilled Nursing Facility  In-House Referral:  Clinical Social Work  Discharge planning Services     Post Acute Care Choice:    Choice offered to:     DME Arranged:    DME Agency:     HH Arranged:    HH Agency:     Status of Service:  In process, will continue to follow  Medicare Important Message Given:    Date Medicare IM Given:    Medicare IM give by:    Date Additional Medicare IM Given:    Additional Medicare Important Message give by:     If discussed at Long Length of Stay Meetings, dates discussed:    Additional Comments:  Marily MemosLisa M Nobuo Nunziata, RN 10/21/2014, 11:22 AM

## 2014-10-22 LAB — GLUCOSE, CAPILLARY
GLUCOSE-CAPILLARY: 248 mg/dL — AB (ref 65–99)
GLUCOSE-CAPILLARY: 216 mg/dL — AB (ref 65–99)
Glucose-Capillary: 182 mg/dL — ABNORMAL HIGH (ref 65–99)
Glucose-Capillary: 341 mg/dL — ABNORMAL HIGH (ref 65–99)

## 2014-10-22 LAB — BASIC METABOLIC PANEL
Anion gap: 8 (ref 5–15)
BUN: 12 mg/dL (ref 6–20)
CHLORIDE: 94 mmol/L — AB (ref 101–111)
CO2: 24 mmol/L (ref 22–32)
CREATININE: 0.95 mg/dL (ref 0.44–1.00)
Calcium: 8.1 mg/dL — ABNORMAL LOW (ref 8.9–10.3)
GFR calc Af Amer: 58 mL/min — ABNORMAL LOW (ref >60)
GFR, EST NON AFRICAN AMERICAN: 50 mL/min — AB (ref >60)
GLUCOSE: 212 mg/dL — AB (ref 65–99)
POTASSIUM: 3.6 mmol/L (ref 3.5–5.1)
Sodium: 126 mmol/L — ABNORMAL LOW (ref 135–145)

## 2014-10-22 LAB — CBC
HCT: 25 % — ABNORMAL LOW (ref 35.0–47.0)
Hemoglobin: 8.4 g/dL — ABNORMAL LOW (ref 12.0–16.0)
MCH: 31.4 pg (ref 26.0–34.0)
MCHC: 33.6 g/dL (ref 32.0–36.0)
MCV: 93.6 fL (ref 80.0–100.0)
PLATELETS: 108 10*3/uL — AB (ref 150–440)
RBC: 2.67 MIL/uL — AB (ref 3.80–5.20)
RDW: 13.3 % (ref 11.5–14.5)
WBC: 10.4 10*3/uL (ref 3.6–11.0)

## 2014-10-22 MED ORDER — LEVOTHYROXINE SODIUM 25 MCG PO TABS
137.0000 ug | ORAL_TABLET | Freq: Every day | ORAL | Status: DC
  Administered 2014-10-23 – 2014-10-25 (×3): 137 ug via ORAL
  Filled 2014-10-22 (×3): qty 1

## 2014-10-22 MED ORDER — VERAPAMIL HCL ER 240 MG PO TBCR
240.0000 mg | EXTENDED_RELEASE_TABLET | Freq: Every day | ORAL | Status: DC
  Administered 2014-10-24 – 2014-10-25 (×2): 240 mg via ORAL
  Filled 2014-10-22 (×4): qty 1

## 2014-10-22 MED ORDER — ALPRAZOLAM 0.5 MG PO TABS
0.5000 mg | ORAL_TABLET | Freq: Every day | ORAL | Status: DC
  Administered 2014-10-24: 0.5 mg via ORAL

## 2014-10-22 MED ORDER — ALPRAZOLAM 0.25 MG PO TABS: 0.2500 mg | ORAL_TABLET | Freq: Two times a day (BID) | ORAL | Status: DC | PRN

## 2014-10-22 MED ORDER — SULFAMETHOXAZOLE-TRIMETHOPRIM 800-160 MG PO TABS
1.0000 | ORAL_TABLET | Freq: Two times a day (BID) | ORAL | Status: DC
  Administered 2014-10-22 – 2014-10-25 (×7): 1 via ORAL
  Filled 2014-10-22 (×8): qty 1

## 2014-10-22 MED ORDER — VERAPAMIL HCL ER 240 MG PO TBCR
240.0000 mg | EXTENDED_RELEASE_TABLET | Freq: Every day | ORAL | Status: DC
  Administered 2014-10-22 – 2014-10-23 (×2): 240 mg via ORAL

## 2014-10-22 MED ORDER — CEFUROXIME AXETIL 250 MG PO TABS: 250.0000 mg | ORAL_TABLET | Freq: Two times a day (BID) | ORAL | Status: DC

## 2014-10-22 MED ORDER — ALPRAZOLAM 0.5 MG PO TABS
0.5000 mg | ORAL_TABLET | Freq: Two times a day (BID) | ORAL | Status: DC | PRN
  Administered 2014-10-22: 0.5 mg via ORAL
  Filled 2014-10-22: qty 1

## 2014-10-22 MED ORDER — CHLORHEXIDINE GLUCONATE CLOTH 2 % EX PADS
6.0000 | MEDICATED_PAD | Freq: Every day | CUTANEOUS | Status: DC
  Administered 2014-10-22 – 2014-10-25 (×4): 6 via TOPICAL

## 2014-10-22 MED ORDER — DEXTROSE 5 % IV SOLN
1.0000 g | INTRAVENOUS | Status: DC
  Administered 2014-10-23 – 2014-10-25 (×3): 1 g via INTRAVENOUS
  Filled 2014-10-22 (×3): qty 10

## 2014-10-22 MED ORDER — INSULIN ASPART 100 UNIT/ML ~~LOC~~ SOLN
0.0000 [IU] | Freq: Three times a day (TID) | SUBCUTANEOUS | Status: DC
  Administered 2014-10-22: 7 [IU] via SUBCUTANEOUS
  Administered 2014-10-22: 3 [IU] via SUBCUTANEOUS
  Administered 2014-10-23: 2 [IU] via SUBCUTANEOUS
  Administered 2014-10-23 – 2014-10-24 (×3): 5 [IU] via SUBCUTANEOUS
  Administered 2014-10-24: 2 [IU] via SUBCUTANEOUS
  Administered 2014-10-24: 7 [IU] via SUBCUTANEOUS
  Administered 2014-10-25: 2 [IU] via SUBCUTANEOUS
  Filled 2014-10-22: qty 3
  Filled 2014-10-22: qty 2
  Filled 2014-10-22: qty 7
  Filled 2014-10-22: qty 5
  Filled 2014-10-22 (×2): qty 2
  Filled 2014-10-22: qty 5
  Filled 2014-10-22: qty 7
  Filled 2014-10-22: qty 5

## 2014-10-22 MED ORDER — ATORVASTATIN CALCIUM 10 MG PO TABS
10.0000 mg | ORAL_TABLET | Freq: Every day | ORAL | Status: DC
  Administered 2014-10-22 – 2014-10-24 (×3): 10 mg via ORAL
  Filled 2014-10-22 (×3): qty 1

## 2014-10-22 MED ORDER — ALPRAZOLAM 0.5 MG PO TABS: 0.5000 mg | ORAL_TABLET | Freq: Every day | ORAL | Status: DC

## 2014-10-22 MED ORDER — MUPIROCIN 2 % EX OINT
1.0000 "application " | TOPICAL_OINTMENT | Freq: Two times a day (BID) | CUTANEOUS | Status: DC
  Administered 2014-10-22 – 2014-10-25 (×7): 1 via NASAL
  Filled 2014-10-22: qty 22

## 2014-10-22 MED ORDER — HYDROCODONE-ACETAMINOPHEN 5-325 MG PO TABS: 1.0000 | ORAL_TABLET | Freq: Four times a day (QID) | ORAL | Status: DC | PRN

## 2014-10-22 MED ORDER — SENNA 8.6 MG PO TABS: 1.0000 | ORAL_TABLET | Freq: Every day | ORAL | Status: DC | PRN

## 2014-10-22 MED ORDER — PANTOPRAZOLE SODIUM 40 MG PO TBEC
40.0000 mg | DELAYED_RELEASE_TABLET | Freq: Every day | ORAL | Status: DC
  Administered 2014-10-22 – 2014-10-25 (×4): 40 mg via ORAL
  Filled 2014-10-22 (×4): qty 1

## 2014-10-22 MED ORDER — CALCIUM CARBONATE-VITAMIN D 500-200 MG-UNIT PO TABS: 1.0000 | ORAL_TABLET | Freq: Two times a day (BID) | ORAL | Status: DC

## 2014-10-22 MED ORDER — DEXTROSE 5 % IV SOLN
1.0000 g | Freq: Once | INTRAVENOUS | Status: AC
  Administered 2014-10-22: 1 g via INTRAVENOUS
  Filled 2014-10-22: qty 10

## 2014-10-22 MED ORDER — FERROUS SULFATE 325 (65 FE) MG PO TABS: 325.0000 mg | ORAL_TABLET | Freq: Two times a day (BID) | ORAL | Status: DC

## 2014-10-22 NOTE — Progress Notes (Signed)
[facility] - [place] at [facility]   PATIENT NAME: Gina [name]    MR#:  [id number]  DATE OF BIRTH:  May 07, 1917  SUBJECTIVE:  CHIEF COMPLAINT:   Chief Complaint  Patient presents with  . Hip Injury  came after a fall and fracture right hip. S/p surgery for right side hip fracture- 10/21/14  no new complains. REVIEW OF SYSTEMS:  CONSTITUTIONAL: No fever, fatigue or weakness.  EYES: No blurred or double vision.  EARS, NOSE, AND THROAT: No tinnitus or ear pain.  RESPIRATORY: No cough, shortness of breath, wheezing or hemoptysis.  CARDIOVASCULAR: No chest pain, orthopnea, edema.  GASTROINTESTINAL: No nausea, vomiting, diarrhea or abdominal pain.  GENITOURINARY: No dysuria, hematuria.  ENDOCRINE: No polyuria, nocturia,  HEMATOLOGY: No anemia, easy bruising or bleeding SKIN: No rash or lesion. MUSCULOSKELETAL: right hip joint pain or arthritis.   NEUROLOGIC: No tingling, numbness, weakness.  PSYCHIATRY: No anxiety or depression.   ROS  DRUG ALLERGIES:   Allergies  Allergen Reactions  . Fosamax [Alendronate Sodium] Other (See Comments)    Unknown    VITALS:  Blood pressure 114/38, pulse 73, temperature 99 F (37.2 C), temperature source Oral, resp. rate 20, height  (1.651 m), weight 72.122 kg (159 lb), SpO2 95 %.  PHYSICAL EXAMINATION:  GENERAL:  81 y.o.-year-old patient lying in the bed with no acute distress.  EYES: Pupils equal, round, reactive to light and accommodation. No scleral icterus. Extraocular muscles intact.  HEENT: Head atraumatic, normocephalic. Oropharynx and nasopharynx clear.  NECK:  Supple, no jugular venous distention. No thyroid enlargement, no tenderness.  LUNGS: Normal breath sounds bilaterally, no wheezing, rales,rhonchi or crepitation. No use of accessory muscles of respiration.  CARDIOVASCULAR: S1, S2 normal. No murmurs, rubs, or gallops.  ABDOMEN: Soft, nontender, nondistended. Bowel sounds present. No  organomegaly or mass.  EXTREMITIES: No pedal edema, cyanosis, or clubbing. Right hip dressing present. NEUROLOGIC: Cranial nerves II through XII are intact. Muscle strength 4/5 in all extremities. Sensation intact. Gait not checked.  PSYCHIATRIC: The patient is alert and oriented x 3.  SKIN: No obvious rash, on nose thee is a lesion which is elevated with rough margins and variation in colour present- size 0.5 cm.  Physical Exam LABORATORY PANEL:   CBC  Recent Labs Lab 10/22/14 0438  WBC 10.4  HGB 8.4*  HCT 25.0*  PLT 108*   ------------------------------------------------------------------------------------------------------------------  Chemistries   Recent Labs Lab 10/22/14 0438  NA 126*  K 3.6  CL 94*  CO2 24  GLUCOSE 212*  BUN 12  CREATININE 0.95  CALCIUM 8.1*   ------------------------------------------------------------------------------------------------------------------  Cardiac Enzymes No results for input(s): TROPONINI in the last 168 hours. ------------------------------------------------------------------------------------------------------------------  RADIOLOGY:  Dg Hip Operative Unilat With Pelvis Right  10/21/2014  CLINICAL DATA:  Right hip fracture EXAM: OPERATIVE RIGHT HIP (WITH PELVIS IF PERFORMED) 2 VIEWS TECHNIQUE: Fluoroscopic spot image(s) were submitted for interpretation post-operatively. COMPARISON:  10/20/2014 FINDINGS: There is a right femoral neck fracture. An intra medullary nail is partially visualized within the proximal right femur. Fluoroscopy time 0 minutes 50 seconds IMPRESSION: Intra medullary nail placement right femur Electronically Signed   By: Esperanza Heir M.D.   On: 10/21/2014 12:12    ASSESSMENT AND PLAN:   Active Problems:   Hip fracture (HCC)   Pressure ulcer  #1Displaced intertrochanteric fracture right hip  S/p surgery- 10/21/14 Follow-up with orthopedics-Dr. Hyacinth Meeker Pain management by ortho  PT and rehab  placement.  #Acute cystitis  Abnormal urinalysis  Follow-up on  the urine culture and sensitivity  patient on IV Rocephin empirically  #3 hypertension home medication verapamil- stable.  #4 diabetes mellitus Hold her home medication metformin and provide sliding scale insulin Provide diabetic diet  #5 hypothyroidism Continue Synthroid home medication  #6 chronic dementia Currently patient is mentating okay Resume her home medications.  #7 anxiety Resume home medications Xanax.  # 8 anemia   Monitor, no need for transfusion for now.  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  Pt will need to go to rehab. All the records are reviewed and case discussed with Care Management/Social Workerr. Management plans discussed with the patient, family and they are in agreement.  CODE STATUS: DNR  TOTAL TIME TAKING CARE OF THIS PATIENT: 35 minutes.   POSSIBLE D/C IN 2-3 DAYS, DEPENDING ON CLINICAL CONDITION.   Altamese DillingVACHHANI, Jocob Dambach M.D on 10/22/2014   Between 7am to 6pm - Pager - [phone]  After 6pm go to [email or web address] - password [email or web address] [facility]  [facility]  Office  [phone]  CC: Primary care physician; No primary care provider on file.  Note: This dictation was prepared with Dragon dictation along with smaller phrase technology. Any transcriptional errors that result from this process are unintentional.

## 2014-10-22 NOTE — Progress Notes (Signed)
Subjective: 1 Day Post-Op Procedure(s) (LRB): INTRAMEDULLARY (IM) NAIL INTERTROCHANTRIC (Right)    Patient reports pain as mild. Confused still.  hgb 8.4  Objective:   VITALS:   Filed Vitals:   10/22/14 0751  BP: 182/80  Pulse: 83  Temp: 97.9 F (36.6 C)  Resp: 16    Neurovascular intact Sensation intact distally Intact pulses distally Dorsiflexion/Plantar flexion intact Incision: scant drainage  ROM of hip good.   LABS  Recent Labs  10/20/14 0944 10/21/14 0656 10/22/14 0438  HGB 11.1* 9.9* 8.4*  HCT 32.9* 29.3* 25.0*  WBC 10.6 12.5* 10.4  PLT 150 136* 108*     Recent Labs  10/20/14 0944 10/21/14 0656 10/22/14 0438  NA 134* 131* 126*  K 4.2 4.1 3.6  BUN 14 14 12   CREATININE 1.03* 0.93 0.95  GLUCOSE 162* 164* 212*     Recent Labs  10/20/14 1150  INR 1.11     Assessment/Plan: 1 Day Post-Op Procedure(s) (LRB): INTRAMEDULLARY (IM) NAIL INTERTROCHANTRIC (Right)   Advance diet Up with therapy Discharge to SNF when stable.

## 2014-10-22 NOTE — Progress Notes (Signed)
Inpatient Diabetes Program Recommendations  AACE/ADA: New Consensus Statement on Inpatient Glycemic Control (2015)  Target Ranges:  Prepandial:   less than 140 mg/dL      Peak postprandial:   less than 180 mg/dL (1-2 hours)      Critically ill patients:  140 - 180 mg/dL   Review of Glycemic Control  Diabetes history: Type 2 Outpatient Diabetes medications: Metformin 500mg  bid Current orders for Inpatient glycemic control: none  Inpatient Diabetes Program Recommendations:  Please order CBG's tid and hs.  Consider ordering Novolog correction, sensitive correction scale 0-9 units tid and Novolog 0-5 units qhs.  Susette RacerJulie Nadav Swindell, RN, BA, MHA, CDE Diabetes Coordinator Inpatient Diabetes Program  [phone] (Team Pager) [phone] [facility] Office) 10/22/2014 9:27 AM

## 2014-10-22 NOTE — Care Management Important Message (Signed)
Important Message  Patient Details  Name: Gina [name] MRN: [id number] Date of Birth: [birth year]   Medicare Important Message Given:  Yes-second notification given    [name], RN 10/22/2014, 10:11 AM

## 2014-10-22 NOTE — Discharge Summary (Addendum)
[facility] - [place] at [facility]   PATIENT NAME: Gina RancherLucille [name]    MR#:  [id number]  DATE OF BIRTH:  14-Oct-1917  DATE OF ADMISSION:  10/20/2014 ADMITTING PHYSICIAN: Deeann SaintHoward Miller, MD  DATE OF DISCHARGE: 10/25/14  PRIMARY CARE PHYSICIAN: No primary care provider on file.    ADMISSION DIAGNOSIS:  Closed right hip fracture, initial encounter (HCC) [S72.001A]  DISCHARGE DIAGNOSIS:  Active Problems:   Hip fracture (HCC)   Pressure ulcer   SECONDARY DIAGNOSIS:   Past Medical History  Diagnosis Date  . Diabetes mellitus without complication (HCC)   . Hypertension   . Dementia   . Anxiety   . GERD (gastroesophageal reflux disease)   . Osteoporosis   . Hypothyroidism     HOSPITAL COURSE:   #1Displaced intertrochanteric fracture right hip  S/p surgery- 10/21/14 Follow-up with orthopedics-Dr. Hyacinth MeekerMiller Pain management by ortho PT and rehab placement.  #Acute cystitis Abnormal urinalysis Follow-up on the urine culture and sensitivity patient on IV Rocephin empirically   Give cefuroxime on discharge.  #3 hypertension home medication verapamil- stable.  #4 diabetes mellitus Hold her home medication metformin and provide sliding scale insulin Provide diabetic diet   Resume metformin on d/c.  #5 hypothyroidism Continue Synthroid home medication  #6 chronic dementia Currently patient is mentating okay Resume her home medications  #7 anxiety Resume home medications Xanax  #8 anemia due to blood loss    Received one unit transfusion on 10/24/14  # 9 Possible skin cancer  the lesion on her nose- looks cancerous - likely squemous cell cancer   Spoke to her daughter - they know about it, and already choose not to go for surgery for that in past.  DISCHARGE CONDITIONS:   Stable.  CONSULTS OBTAINED:  Treatment Team:  Deeann SaintHoward Miller, MD Ramonita LabAruna Gouru, MD  DRUG ALLERGIES:   Allergies  Allergen Reactions  . Fosamax [Alendronate  Sodium] Other (See Comments)    Unknown    DISCHARGE MEDICATIONS:   Current Discharge Medication List    START taking these medications   Details  calcium-vitamin D (OSCAL WITH D) 500-200 MG-UNIT tablet Take 1 tablet by mouth 2 (two) times daily. Qty: 60 tablet, Refills: 0    cefUROXime (CEFTIN) 250 MG tablet Take 1 tablet (250 mg total) by mouth 2 (two) times daily with a meal. For 2 days Qty: 4 tablet, Refills: 0    ferrous sulfate 325 (65 FE) MG tablet Take 1 tablet (325 mg total) by mouth 2 (two) times daily with a meal. Qty: 60 tablet, Refills: 3    HYDROcodone-acetaminophen (NORCO/VICODIN) 5-325 MG tablet Take 1-2 tablets by mouth every 6 (six) hours as needed for moderate pain. Qty: 30 tablet, Refills: 0    senna (SENOKOT) 8.6 MG TABS tablet Take 1 tablet (8.6 mg total) by mouth daily as needed for mild constipation. Qty: 30 each, Refills: 0      CONTINUE these medications which have CHANGED   Details  !! ALPRAZolam (XANAX) 0.25 MG tablet Take 1 tablet (0.25 mg total) by mouth 2 (two) times daily as needed for anxiety. Qty: 30 tablet, Refills: 0    !! ALPRAZolam (XANAX) 0.5 MG tablet Take 1 tablet (0.5 mg total) by mouth at bedtime. Qty: 10 tablet, Refills: 0     !! - Potential duplicate medications found. Please discuss with provider.    CONTINUE these medications which have NOT CHANGED   Details  atorvastatin (LIPITOR) 10 MG tablet Take 10 mg by mouth  daily.    levothyroxine (SYNTHROID, LEVOTHROID) 137 MCG tablet Take 137 mcg by mouth daily before breakfast.    loperamide (IMODIUM) 2 MG capsule Take 2 mg by mouth as needed for diarrhea or loose stools.    metFORMIN (GLUCOPHAGE) 500 MG tablet Take 500 mg by mouth 2 (two) times daily with a meal.    neomycin-bacitracin-polymyxin (NEOSPORIN) ointment Apply 1 application topically as needed for wound care. apply to eye    nystatin-triamcinolone (MYCOLOG II) cream Apply 1 application topically as needed.     omeprazole (PRILOSEC) 20 MG capsule Take 20 mg by mouth daily.    sodium chloride (OCEAN) 0.65 % SOLN nasal spray Place 2 sprays into both nostrils 4 (four) times daily as needed for congestion.    sodium chloride 1 G tablet Take 1 g by mouth 2 (two) times daily with a meal.    traZODone (DESYREL) 50 MG tablet Take 50 mg by mouth at bedtime.    verapamil (VERELAN PM) 240 MG 24 hr capsule Take 240 mg by mouth daily at 12 noon.    vitamin C (ASCORBIC ACID) 500 MG tablet Take 500 mg by mouth daily.      STOP taking these medications     ranitidine (ZANTAC) 300 MG tablet          DISCHARGE INSTRUCTIONS:    Follow with Orthopedic clinic in 1 week.  If you experience worsening of your admission symptoms, develop shortness of breath, life threatening emergency, suicidal or homicidal thoughts you must seek medical attention immediately by calling 911 or calling your MD immediately  if symptoms less severe.  You Must read complete instructions/literature along with all the possible adverse reactions/side effects for all the Medicines you take and that have been prescribed to you. Take any new Medicines after you have completely understood and accept all the possible adverse reactions/side effects.   Please note  You were cared for by a hospitalist during your hospital stay. If you have any questions about your discharge medications or the care you received while you were in the hospital after you are discharged, you can call the unit and asked to speak with the hospitalist on call if the hospitalist that took care of you is not available. Once you are discharged, your primary care physician will handle any further medical issues. Please note that NO REFILLS for any discharge medications will be authorized once you are discharged, as it is imperative that you return to your primary care physician (or establish a relationship with a primary care physician if you do not have one) for your  aftercare needs so that they can reassess your need for medications and monitor your lab values.    Today   CHIEF COMPLAINT:   Chief Complaint  Patient presents with  . Hip Injury    HISTORY OF PRESENT ILLNESS:  Gina [name]  is a 81 y.o. female with a known history of diverticular disease, hypertension, hypothyroidism and dementia is a resident at [facility] memory unit and is brought into the ED after she had an un-witnessed fall yesterday evening. In the ED x-rays revealed right intertrochanteric fracture-patient was seen by orthopedics Dr. Hyacinth Meeker . Patient is resting comfortably during my examination and daughter is at bedside   VITAL SIGNS:  Blood pressure 182/80, pulse 83, temperature 97.9 F (36.6 C), temperature source Oral, resp. rate 16, height  (1.651 m), weight 72.122 kg (159 lb), SpO2 96 %.  I/O:    Intake/Output Summary (Last  24 hours) at 10/22/14 0819 Last data filed at 10/22/14 0751  Gross per 24 hour  Intake    600 ml  Output   1880 ml  Net  -1280 ml    PHYSICAL EXAMINATION:   GENERAL: 81 y.o.-year-old patient lying in the bed with no acute distress.  EYES: Pupils equal, round, reactive to light and accommodation. No scleral icterus. Extraocular muscles intact.  HEENT: Head atraumatic, normocephalic. Oropharynx and nasopharynx clear.  NECK: Supple, no jugular venous distention. No thyroid enlargement, no tenderness.  LUNGS: Normal breath sounds bilaterally, no wheezing, rales,rhonchi or crepitation. No use of accessory muscles of respiration.  CARDIOVASCULAR: S1, S2 normal. No murmurs, rubs, or gallops.  ABDOMEN: Soft, nontender, nondistended. Bowel sounds present. No organomegaly or mass.  EXTREMITIES: No pedal edema, cyanosis, or clubbing. Right hip dressing present. NEUROLOGIC: Cranial nerves II through XII are intact. Muscle strength 4/5 in all extremities. Sensation intact. Gait not checked.  PSYCHIATRIC: The patient is alert and  oriented x 3.  SKIN: No obvious rash, on nose thee is a lesion which is elevated with rough margins and variation in colour present- size 0.5 cm.  DATA REVIEW:   CBC  Recent Labs Lab 10/22/14 0438  WBC 10.4  HGB 8.4*  HCT 25.0*  PLT 108*    Chemistries   Recent Labs Lab 10/22/14 0438  NA 126*  K 3.6  CL 94*  CO2 24  GLUCOSE 212*  BUN 12  CREATININE 0.95  CALCIUM 8.1*    Cardiac Enzymes No results for input(s): TROPONINI in the last 168 hours.  Microbiology Results  Results for orders placed or performed during the hospital encounter of 10/20/14  Urine culture     Status: None (Preliminary result)   Collection Time: 10/20/14 11:19 AM  Result Value Ref Range Status   Specimen Description URINE, CLEAN CATCH  Final   Special Requests Normal  Final   Culture   Final    >=100,000 COLONIES/mL GRAM NEGATIVE RODS IDENTIFICATION AND SUSCEPTIBILITIES TO FOLLOW    Report Status PENDING  Incomplete  MRSA PCR Screening     Status: Abnormal   Collection Time: 10/20/14  3:51 PM  Result Value Ref Range Status   MRSA by PCR POSITIVE (A) NEGATIVE Final    Comment: [facility] CALLED BHABINI PATEL AT 1751 10/19/14         The GeneXpert MRSA Assay (FDA approved for NASAL specimens only), is one component of a comprehensive MRSA colonization surveillance program. It is not intended to diagnose MRSA infection nor to guide or monitor treatment for MRSA infections.     RADIOLOGY:  Dg Chest 1 View  10/20/2014  CLINICAL DATA:  Unwitnessed fall, dementia EXAM: CHEST 1 VIEW COMPARISON:  09/08/2013 FINDINGS: The heart size and mediastinal contours are within normal limits. Both lungs are clear. The visualized skeletal structures are unremarkable. IMPRESSION: No active disease. Electronically Signed   By: Charlett Nose M.D.   On: 10/20/2014 09:45   Dg Thoracic Spine 2 View  10/20/2014  CLINICAL DATA:  Mid/lower back pain. Fall yesterday. Initial encounter. EXAM: THORACIC SPINE 2 VIEWS  COMPARISON:  Chest CT 05/29/2013 FINDINGS: There are 12 full sized pairs of ribs. Mild S-shaped thoracolumbar scoliosis is again seen, convex to the right in the mid thoracic spine. There is no listhesis. Thoracic vertebral body heights are preserved without evidence of compression fracture. Mild thoracic spondylosis is present. The bones are diffusely osteopenic. Atherosclerotic vascular calcification is noted. IMPRESSION: 1. No acute osseous abnormality  identified in the thoracic spine. 2. Mild thoracic scoliosis and spondylosis. Electronically Signed   By: Sebastian Ache M.D.   On: 10/20/2014 09:45   Dg Lumbar Spine 2-3 Views  10/20/2014  CLINICAL DATA:  Status post fall EXAM: LUMBAR SPINE - 2-3 VIEW COMPARISON:  10/20/2012 FINDINGS: There is a chronic L2 vertebral body compression fracture unchanged compared with 10/20/2012. The remainder the vertebral body heights are maintained. Alignment is normal. There is degenerative disc disease with disc height loss at L4-5. There is diffuse bilateral facet arthropathy throughout the lumbar spine. There is thoracic aortic atherosclerosis. IMPRESSION: 1. No acute osseous injury of the lumbar spine. 2. Chronic L2 vertebral body compression fracture. Electronically Signed   By: Elige Ko   On: 10/20/2014 09:40   Dg Hip Operative Unilat With Pelvis Right  10/21/2014  CLINICAL DATA:  Right hip fracture EXAM: OPERATIVE RIGHT HIP (WITH PELVIS IF PERFORMED) 2 VIEWS TECHNIQUE: Fluoroscopic spot image(s) were submitted for interpretation post-operatively. COMPARISON:  10/20/2014 FINDINGS: There is a right femoral neck fracture. An intra medullary nail is partially visualized within the proximal right femur. Fluoroscopy time 0 minutes 50 seconds IMPRESSION: Intra medullary nail placement right femur Electronically Signed   By: Esperanza Heir M.D.   On: 10/21/2014 12:12   Dg Hip Unilat With Pelvis 2-3 Views Right  10/20/2014  CLINICAL DATA:  Fall yesterday. Shortened  right leg. Right hip pain. EXAM: DG HIP (WITH OR WITHOUT PELVIS) 2-3V RIGHT COMPARISON:  None FINDINGS: There is a a right femoral neck fracture, likely intertrochanteric. Varus angulation. No subluxation or dislocation. Mild symmetric degenerative changes in the hips bilaterally. IMPRESSION: Right proximal femoral intertrochanteric fracture with varus angulation. Electronically Signed   By: Charlett Nose M.D.   On: 10/20/2014 09:44    EKG:   Orders placed or performed during the hospital encounter of 10/20/14  . EKG 12-Lead  . EKG 12-Lead      Management plans discussed with the patient, family and they are in agreement.  CODE STATUS:     Code Status Orders        Start     Ordered   10/21/14 1327  Do not attempt resuscitation (DNR)   Continuous    Question Answer Comment  In the event of cardiac or respiratory ARREST Do not call a "code blue"   In the event of cardiac or respiratory ARREST Do not perform Intubation, CPR, defibrillation or ACLS   In the event of cardiac or respiratory ARREST Use medication by any route, position, wound care, and other measures to relive pain and suffering. May use oxygen, suction and manual treatment of airway obstruction as needed for comfort.      10/21/14 1326      TOTAL TIME TAKING CARE OF THIS PATIENT: 35 minutes.    Altamese Dilling M.D on 10/22/2014 at 8:19 AM  Between 7am to 6pm - Pager - [phone]  After 6pm go to [email or web address] - password [email or web address] [facility]  [place] [facility]  Office  [phone]  CC: Primary care physician; No primary care provider on file.   Note: This dictation was prepared with Dragon dictation along with smaller phrase technology. Any transcriptional errors that result from this process are unintentional.

## 2014-10-22 NOTE — Progress Notes (Signed)
Plan is for patient to D/C to [facility] Sunday 10/24/14 pending medical clearance. Clinical Child psychotherapistocial Worker (CSW) sent D/C Summary to [employer] admissions coordinator at [facility] today via carefinder. Per Doug patient is going to room 504. Patient's daughter Dewayne Hatchnn is aware of above. CSW will continue to follow and assist as needed.   Jetta LoutBailey Morgan, LCSWA [phone]

## 2014-10-22 NOTE — Evaluation (Signed)
Physical Therapy Evaluation Patient Details Name: Gina [name] MRN: [id number] DOB: 26-Apr-1917 Today's Date: 10/22/2014   History of Present Illness  Pt had a fall trying to get to the bathroom w/o AD, R hip fx  Clinical Impression  Pt is limited with how much she understands because of confusion on top of dementia.  She has little/no pain at rest but does have significant pain results with R LE ROM acts.  She initially struggles with the 10 minutes of exercises apart from exam, but with cuing and increased reps she is actually able to participate and show decent effort and improve increased quality of motion with R LE acts.  Pt does very poorly with ambulation and though she was able to maintain PWBing, she was so hesitant to take any weight on the R that she is unable to effectively "walk."   Follow Up Recommendations SNF    Equipment Recommendations       Recommendations for Other Services       Precautions / Restrictions Precautions Precautions: Fall Restrictions Weight Bearing Restrictions: Yes RLE Weight Bearing: Partial weight bearing      Mobility  Bed Mobility Overal bed mobility: Needs Assistance Bed Mobility: Supine to Sit     Supine to sit: Mod assist;Max assist     General bed mobility comments: Pt shows ability to give some effort with getting to EOB, but between confusion, pain and weakness she has definite need for assist.  Transfers Overall transfer level: Needs assistance Equipment used: Rolling walker (2 wheeled) Transfers: Sit to/from Stand Sit to Stand: Max assist         General transfer comment: Pt struggles to fully get up right and put enough weight through the walker.  Pt is fearful and needs excessive cuing to try and stay upright  Ambulation/Gait Ambulation/Gait assistance: Max assist Ambulation Distance (Feet): 2 Feet Assistive device: Rolling walker (2 wheeled)       General Gait Details: Pt shows poor ability to take weight  through the walker/shift any weight onto the R to allow for actual ambulation.  Pt took ~1.5 steps and then needs max assist to control her descent to the recliner before she was in an appropriate position to do so.  Stairs            Wheelchair Mobility    Modified Rankin (Stroke Patients Only)       Balance                                             Pertinent Vitals/Pain Pain Assessment: No/denies pain (no pain at rest, increases with any R LE movement)    Home Living Family/patient expects to be discharged to:: Skilled nursing facility                 Additional Comments: pt has been to [facility] in the past    Prior Function Level of Independence: Needs assistance         Comments: Pt at ALF and ambulates often with her walker, she does have some baseline dementia      Hand Dominance        Extremity/Trunk Assessment   Upper Extremity Assessment: Generalized weakness (age appropriate limitations)           Lower Extremity Assessment: RLE deficits/detail RLE Deficits / Details: Pt has some R LE  AROM, but is hesitant and painful with them.  Appears to be grossly 3+/5 except against gravity hip flexion       Communication   Communication:  (confused, at times unable to attempt answering questions)  Cognition Arousal/Alertness: Awake/alert Behavior During Therapy: Anxious Overall Cognitive Status: History of cognitive impairments - at baseline                      General Comments      Exercises General Exercises - Lower Extremity Ankle Circles/Pumps: AROM;Strengthening;10 reps Quad Sets: Strengthening;10 reps Gluteal Sets: Strengthening;10 reps Short Arc Quad: AROM;15 reps;AAROM Heel Slides: AROM;AAROM;10 reps Hip ABduction/ADduction: AROM;AAROM;10 reps      Assessment/Plan    PT Assessment Patient needs continued PT services  PT Diagnosis Difficulty walking;Generalized weakness   PT Problem  List Decreased strength;Decreased range of motion;Decreased activity tolerance;Decreased balance;Decreased mobility;Decreased coordination;Decreased knowledge of use of DME;Decreased safety awareness;Pain  PT Treatment Interventions DME instruction;Gait training;Functional mobility training;Therapeutic exercise;Therapeutic activities;Balance training;Neuromuscular re-education;Patient/family education   PT Goals (Current goals can be found in the Care Plan section) Acute Rehab PT Goals Patient Stated Goal: getting back to walking PT Goal Formulation: With patient Time For Goal Achievement: 11/05/14 Potential to Achieve Goals: Fair    Frequency BID   Barriers to discharge        Co-evaluation               End of Session Equipment Utilized During Treatment: Gait belt Activity Tolerance: Patient limited by pain (confusion) Patient left: with chair alarm set;with family/visitor present           Time: 1610-9604 PT Time Calculation (min) (ACUTE ONLY): 25 min   Charges:   PT Evaluation $Initial PT Evaluation Tier I: 1 Procedure PT Treatments $Therapeutic Exercise: 8-22 mins   PT G Codes:       Gina [name], PT, DPT [phone]  Gina [name] 10/22/2014, 10:07 AM

## 2014-10-22 NOTE — Evaluation (Signed)
Occupational Therapy Evaluation Patient Details Name: Gina [name] MRN: [id number] DOB: [birth year] Today's Date: 10/22/2014    History of Present Illness Pt had a fall trying to get to the bathroom w/o AD, R hip fx.  Pt is confused with hx of dementia and was at [facility]F when she got up to go to bathroom without AD and fell. Plan is to go to SNF, possibly [facility].   Clinical Impression   Pt is 81 year old female s/p R hip pinning was at [facility] when she went to the bathroom without her AD and fell and broke her hip. Pt has a hx of dementia and does not remember falling or having surgery and no family present to ask what PLOF was.  Pt is currently limited in functional ADLs due to pain, decreased ROM,and decreased cognition .  Pt requires maximum to total assist for LB dressing and bathing skills due to pain and decreased AROM of R LE and would benefit from continued skilled OT services for education in functional mobility, and education for family about recommendations for modifications to increase safety and prevent falls.  Pt would benefit from SNF to continue rehabilitation.       Follow Up Recommendations  SNF    Equipment Recommendations       Recommendations for Other Services       Precautions / Restrictions Precautions Precautions: Fall Restrictions Weight Bearing Restrictions: Yes RLE Weight Bearing: Weight bearing as tolerated      Mobility Bed Mobility            Balance                                            ADL                                         General ADL Comments: Pt requires max assist to total assist for LB dressing due to pain, decreased cognition from dementia and decreased ability to understand instructions for adaptive equipment.  Focus of OT will be on family ed and training and LB dressing skills when she is able to flex forward to reach feet. She leans to the right in  recliner stating her back hurts.  Rec using pillows to help position in midline to avoid furhter muscle pain and imbalances.     Vision     Perception     Praxis      Pertinent Vitals/Pain Pain Assessment: No/denies pain     Hand Dominance Right   Extremity/Trunk Assessment Upper Extremity Assessment Upper Extremity Assessment: Generalized weakness   Lower Extremity Assessment Lower Extremity Assessment: Defer to PT evaluation RLE Deficits / Details: Pt has some R LE AROM, but is hesitant and painful with them.  Appears to be grossly 3+/5 except against gravity hip flexion       Communication Communication Communication: Other (comment) (delayed response to questions due to dementia)   Cognition Arousal/Alertness: Awake/alert Behavior During Therapy: Anxious Overall Cognitive Status: History of cognitive impairments - at baseline       Memory:  (pt does not remember falling or having surgery)             General Comments       Exercises  Shoulder Instructions      Home Living Family/patient expects to be discharged to:: Skilled nursing facility                                 Additional Comments: pt has been to [facility] in the past      Prior Functioning/Environment Level of Independence: Needs assistance        Comments: Pt at [facility] and ambulates often with her walker, she does have some baseline dementia     OT Diagnosis: Generalized weakness;Acute pain;Cognitive deficits   OT Problem List: Decreased strength;Decreased range of motion;Pain   OT Treatment/Interventions: Self-care/ADL training    OT Goals(Current goals can be found in the care plan section) Acute Rehab OT Goals Patient Stated Goal: do things for myself again OT Goal Formulation: With patient Time For Goal Achievement: 11/05/14 Potential to Achieve Goals: Good ADL Goals Pt Will Perform Lower Body Dressing: with mod assist;with adaptive  equipment;sit to/from stand (with or without assistive devices since pt has dementia) Pt Will Transfer to Toilet: with max assist;squat pivot transfer;bedside commode (with FWW) Pt Will Perform Toileting - Clothing Manipulation and hygiene: with mod assist;sit to/from stand Additional ADL Goal #1: Family will be educated on how to assist patient since adaptive equipment training may not be appropriate.  OT Frequency: Min 1X/week   Barriers to D/C:            Co-evaluation              End of Session Equipment Utilized During Treatment:  (reacher and sock aid)  Activity Tolerance: Patient limited by pain Patient left: in chair;with call bell/phone within reach;with chair alarm set   Time: 1040-1108 OT Time Calculation (min): 28 min Charges:  OT General Charges $OT Visit: 1 Procedure OT Evaluation $Initial OT Evaluation Tier I: 1 Procedure OT Treatments $Self Care/Home Management : 8-22 mins G-Codes:    Gina [name] 10/22/2014, 12:56 PM   Gina BordersSusan Tiffancy [name], OTR/L ascom [phone]

## 2014-10-22 NOTE — Progress Notes (Signed)
Physical Therapy Treatment Patient Details Name: Aniceto BossLucille H Smyre MRN: [id number] DOB: 12/01/1917 Today's Date: 10/22/2014    History of Present Illness Pt with fall, R hip fx and subsequent ORIF    PT Comments    Pt generally confused t/o session making most instruction difficult.  She needed max assist to get to standing X 2, but was unable to get hips forward and use the walker appropriately to get to fully upright.  Pt ultimately needing max assist stand-pivot transfer to get back to bed as she was unable to stand, take weight, and try to ambulate.    Follow Up Recommendations  SNF     Equipment Recommendations       Recommendations for Other Services       Precautions / Restrictions Precautions Precautions: Fall Restrictions Weight Bearing Restrictions: Yes RLE Weight Bearing: Partial weight bearing    Mobility  Bed Mobility Overal bed mobility: Needs Assistance Bed Mobility: Sit to Supine       Sit to supine: Max assist   General bed mobility comments: Pt needing complete assist to get laying back down in bed from sitting  Transfers Overall transfer level: Needs assistance Equipment used: Rolling walker (2 wheeled) Transfers: Sit to/from Stand Sit to Stand: Max assist         General transfer comment: sit to stand X 2, pt struggles to get to upright and maintain w/o heavy assist  Ambulation/Gait             General Gait Details: attempted to ambulate on both standing efforts, pt unable to move LEs, ended up with max assist SPT back to bed   Stairs            Wheelchair Mobility    Modified Rankin (Stroke Patients Only)       Balance                                    Cognition Arousal/Alertness:  (awake, confused) Behavior During Therapy: Anxious Overall Cognitive Status: History of cognitive impairments - at baseline       Memory:  (pt does not remember falling or having surgery)               Exercises General Exercises - Lower Extremity Ankle Circles/Pumps: AROM;Strengthening;10 reps Quad Sets: Strengthening;10 reps Gluteal Sets: Strengthening;10 reps Short Arc Quad: AROM;15 reps;AAROM Heel Slides: AROM;AAROM;10 reps Hip ABduction/ADduction: AROM;AAROM;10 reps    General Comments        Pertinent Vitals/Pain Pain Assessment:  (pt does not rate, indicates pain with any R LE movement)    Home Living Family/patient expects to be discharged to:: Skilled nursing facility               Additional Comments: pt has been to [facility] in the past    Prior Function Level of Independence: Needs assistance      Comments: Pt at [facility] and ambulates often with her walker, she does have some baseline dementia    PT Goals (current goals can now be found in the care plan section) Acute Rehab PT Goals Patient Stated Goal: do things for myself again Progress towards PT goals: Progressing toward goals    Frequency  BID    PT Plan Current plan remains appropriate    Co-evaluation             End of Session Equipment Utilized  During Treatment: Gait belt Activity Tolerance: Patient limited by pain Patient left: with bed alarm set;with family/visitor present     Time: 1350-1416 PT Time Calculation (min) (ACUTE ONLY): 26 min  Charges:  $Therapeutic Exercise: 8-22 mins $Therapeutic Activity: 8-22 mins                    G Codes:     Loran Senters, PT, DPT [phone]  Malachi Pro 10/22/2014, 3:34 PM

## 2014-10-23 LAB — BASIC METABOLIC PANEL
Anion gap: 5 (ref 5–15)
BUN: 10 mg/dL (ref 6–20)
CALCIUM: 8 mg/dL — AB (ref 8.9–10.3)
CHLORIDE: 94 mmol/L — AB (ref 101–111)
CO2: 23 mmol/L (ref 22–32)
Creatinine, Ser: 0.94 mg/dL (ref 0.44–1.00)
GFR calc Af Amer: 58 mL/min — ABNORMAL LOW (ref >60)
GFR, EST NON AFRICAN AMERICAN: 50 mL/min — AB (ref >60)
GLUCOSE: 163 mg/dL — AB (ref 65–99)
POTASSIUM: 3.8 mmol/L (ref 3.5–5.1)
Sodium: 122 mmol/L — ABNORMAL LOW (ref 135–145)

## 2014-10-23 LAB — GLUCOSE, CAPILLARY
GLUCOSE-CAPILLARY: 155 mg/dL — AB (ref 65–99)
GLUCOSE-CAPILLARY: 298 mg/dL — AB (ref 65–99)
GLUCOSE-CAPILLARY: 265 mg/dL — AB (ref 65–99)
Glucose-Capillary: 239 mg/dL — ABNORMAL HIGH (ref 65–99)

## 2014-10-23 LAB — CBC
HEMATOCRIT: 24.9 % — AB (ref 35.0–47.0)
Hemoglobin: 8.3 g/dL — ABNORMAL LOW (ref 12.0–16.0)
MCH: 31.3 pg (ref 26.0–34.0)
MCHC: 33.3 g/dL (ref 32.0–36.0)
MCV: 93.9 fL (ref 80.0–100.0)
PLATELETS: 121 10*3/uL — AB (ref 150–440)
RBC: 2.65 MIL/uL — AB (ref 3.80–5.20)
RDW: 13.4 % (ref 11.5–14.5)
WBC: 11.6 10*3/uL — ABNORMAL HIGH (ref 3.6–11.0)

## 2014-10-23 LAB — URINE CULTURE: Special Requests: NORMAL

## 2014-10-23 MED ORDER — SODIUM CHLORIDE 0.9 % IV SOLN
INTRAVENOUS | Status: DC
  Administered 2014-10-23: 15:00:00 via INTRAVENOUS

## 2014-10-23 NOTE — Addendum Note (Signed)
Addendum  created 10/23/14 0850 by Geoffery SpruceAna Suren Payne, CRNA   Modules edited: Notes Section   Notes Section:  File: [id number]

## 2014-10-23 NOTE — Progress Notes (Signed)
OT Cancellation Note  Patient Details Name: Gina [name] MRN: [id number] DOB: 11/12/1917   Cancelled Treatment:     Pt declined OT due to being too tired from PT just finishing with her and stated "I am out, no more."   No family on room for family ed and training.Will continue ADL training on Monday 10/25/14.    Wofford,Susan   Susanne BordersSusan Wofford, OTR/L ascom [phone]  10/23/2014, 1:57 PM

## 2014-10-23 NOTE — Anesthesia Postprocedure Evaluation (Signed)
  Anesthesia Post-op Note  Patient: Gina BossLucille H Turton  Procedure(s) Performed: Procedure(s): INTRAMEDULLARY (IM) NAIL INTERTROCHANTRIC (Right)  Anesthesia type:Spinal  Patient location: PACU  Post pain: Pain level controlled  Post assessment: Post-op Vital signs reviewed, Patient's Cardiovascular Status Stable, Respiratory Function Stable, Patent Airway and No signs of Nausea or vomiting  Post vital signs: Reviewed and stable  Last Vitals:  Filed Vitals:   10/23/14 0755  BP: 154/58  Pulse: 99  Temp: 36.8 C  Resp: 18    Level of consciousness: awake, alert  and patient cooperative  Complications: No apparent anesthesia complications

## 2014-10-23 NOTE — Progress Notes (Signed)
Subjective:  Patient sitting up in a chair. Patient reports pain as mild.  Daughter is at the bedside. Patient has hyponatremia on her labs today. She is reported to have been drinking a lot of water.  Objective:   VITALS:   Filed Vitals:   10/22/14 1609 10/22/14 2104 10/23/14 0546 10/23/14 0755  BP: 108/52 114/38 149/59 154/58  Pulse: 80 73 90 99  Temp: 98.2 F (36.8 C) 99 F (37.2 C) 98.5 F (36.9 C) 98.2 F (36.8 C)  TempSrc: Oral Oral Oral Oral  Resp: 18 20 18 18   Height:      Weight:      SpO2: 98% 95% 94% 97%    PHYSICAL EXAM:  Right lower extremity exam reveals: Neurovascular intact Sensation intact distally Intact pulses distally Dorsiflexion/Plantar flexion intact Incision: dressing C/D/I No cellulitis present Compartment soft  LABS  Results for orders placed or performed during the hospital encounter of 10/20/14 (from the past 24 hour(s))  Glucose, capillary     Status: Abnormal   Collection Time: 10/22/14  4:11 PM  Result Value Ref Range   Glucose-Capillary 248 (H) 65 - 99 mg/dL   Comment 1 Notify RN   Glucose, capillary     Status: Abnormal   Collection Time: 10/22/14  9:07 PM  Result Value Ref Range   Glucose-Capillary 216 (H) 65 - 99 mg/dL   Comment 1 Notify RN   CBC     Status: Abnormal   Collection Time: 10/23/14  3:58 AM  Result Value Ref Range   WBC 11.6 (H) 3.6 - 11.0 K/uL   RBC 2.65 (L) 3.80 - 5.20 MIL/uL   Hemoglobin 8.3 (L) 12.0 - 16.0 g/dL   HCT 16.124.9 (L) 09.635.0 - 04.547.0 %   MCV 93.9 80.0 - 100.0 fL   MCH 31.3 26.0 - 34.0 pg   MCHC 33.3 32.0 - 36.0 g/dL   RDW 40.913.4 81.111.5 - 91.414.5 %   Platelets 121 (L) 150 - 440 K/uL  Basic metabolic panel     Status: Abnormal   Collection Time: 10/23/14  3:58 AM  Result Value Ref Range   Sodium 122 (L) 135 - 145 mmol/L   Potassium 3.8 3.5 - 5.1 mmol/L   Chloride 94 (L) 101 - 111 mmol/L   CO2 23 22 - 32 mmol/L   Glucose, Bld 163 (H) 65 - 99 mg/dL   BUN 10 6 - 20 mg/dL   Creatinine, Ser 7.820.94 0.44 -  1.00 mg/dL   Calcium 8.0 (L) 8.9 - 10.3 mg/dL   GFR calc non Af Amer 50 (L) >60 mL/min   GFR calc Af Amer 58 (L) >60 mL/min   Anion gap 5 5 - 15  Glucose, capillary     Status: Abnormal   Collection Time: 10/23/14  7:55 AM  Result Value Ref Range   Glucose-Capillary 155 (H) 65 - 99 mg/dL  Glucose, capillary     Status: Abnormal   Collection Time: 10/23/14 11:22 AM  Result Value Ref Range   Glucose-Capillary 298 (H) 65 - 99 mg/dL    No results found.  Assessment/Plan: 2 Days Post-Op   Active Problems:   Hip fracture (HCC)   Pressure ulcer  Patient should have her fluid restricted. I will defer to them medical team regarding further treatment for hyponatremia. Recheck labs in the morning. Currently she does not require a blood transfusion. Continue physical occupational therapy. Patient has TED stockings and foot pumps along with Lovenox for DVT prophylaxis. Encourage incentive spirometry  while awake.    Juanell Fairly , MD 10/23/2014, 12:37 PM

## 2014-10-23 NOTE — Progress Notes (Signed)
Dr. Martha ClanKrasinski ordered 1000mL fluid restriction d/t patient's Na of 122. Advised Dr. Karrie MeresVichhani and he ordered NS @ 8350mL/hr.

## 2014-10-23 NOTE — Progress Notes (Signed)
Physical Therapy Treatment Patient Details Name: Aniceto BossLucille H Llewellyn MRN: [id number] DOB: [birth year] Today's Date: 10/23/2014    History of Present Illness Pt with fall, R hip fx and subsequent ORIF    PT Comments    Pt is pleasantly confused t/o the session but did make a little better effort with "ambulation" but is still unable to maintain standing safely and begins to sit before she is even close to getting squared up to the chair. Pt very limited secondary to confusion, pain and lack of situational awareness (she kept asking if this was the right building, etc)  Follow Up Recommendations  SNF     Equipment Recommendations       Recommendations for Other Services       Precautions / Restrictions Precautions Precautions: Fall Restrictions RLE Weight Bearing: Partial weight bearing    Mobility  Bed Mobility Overal bed mobility: Needs Assistance Bed Mobility: Supine to Sit     Supine to sit: Mod assist;Max assist     General bed mobility comments: Pt continues to be heavily reliant on the PT to get to upright at EOB  Transfers Overall transfer level: Needs assistance Equipment used: Rolling walker (2 wheeled) Transfers: Sit to/from Stand Sit to Stand: Max assist         General transfer comment: Pt again is not very steady in standing and though she is able to stand taller and seems minimally more able to use walker.    Ambulation/Gait             General Gait Details: Again no true ambualtion.  She did attempt to move each foot 1-2 times, but struggles to effectively stay upright and try to walk.     Stairs            Wheelchair Mobility    Modified Rankin (Stroke Patients Only)       Balance                                    Cognition Arousal/Alertness:  (awake, marginally aware of her situation) Behavior During Therapy: WFL for tasks assessed/performed Overall Cognitive Status: History of cognitive impairments - at  baseline                      Exercises General Exercises - Lower Extremity Ankle Circles/Pumps: AROM;Strengthening;10 reps Quad Sets: Strengthening;10 reps Gluteal Sets: Strengthening;10 reps Short Arc Quad: AROM;15 reps;AAROM Heel Slides: AROM;AAROM;10 reps Hip ABduction/ADduction: AROM;AAROM;10 reps    General Comments        Pertinent Vitals/Pain Pain Assessment:  (does not rate, has R hip pain with any movement)    Home Living                      Prior Function            PT Goals (current goals can now be found in the care plan section) Progress towards PT goals: Progressing toward goals    Frequency  BID    PT Plan Current plan remains appropriate    Co-evaluation             End of Session Equipment Utilized During Treatment: Gait belt Activity Tolerance: Patient limited by pain Patient left: with chair alarm set     Time: 7829-56211008-1037 PT Time Calculation (min) (ACUTE ONLY): 29 min  Charges:  $Therapeutic Exercise: 8-22 mins $Therapeutic  Activity: 8-22 mins                    G Codes:     Loran Senters, [place], DPT [phone]  Malachi Pro 10/23/2014, 1:02 PM

## 2014-10-23 NOTE — Progress Notes (Signed)
Physical Therapy Treatment Patient Details Name: Gina [name] MRN: [id number] DOB: [birth year] Today's Date: 10/23/2014    History of Present Illness Pt with fall, R hip fx and subsequent ORIF    PT Comments    Pt continues to be pleasantly confused, she c/o more pain this session than previous.  She is able to do supine exercises well this afternoon, but is still unable to do much effective standing/transfers.    Follow Up Recommendations  SNF     Equipment Recommendations       Recommendations for Other Services       Precautions / Restrictions Precautions Precautions: Fall Restrictions Weight Bearing Restrictions: Yes RLE Weight Bearing: Partial weight bearing    Mobility  Bed Mobility Overal bed mobility: Needs Assistance Bed Mobility: Supine to Sit     Supine to sit: Mod assist;Max assist     General bed mobility comments: Pt unable to assist much with EOB to supine  Transfers Overall transfer level: Needs assistance   Transfers: Stand Pivot Transfers Sit to Stand: Max assist            Ambulation/Gait                 Stairs            Wheelchair Mobility    Modified Rankin (Stroke Patients Only)       Balance                                    Cognition   Behavior During Therapy: WFL for tasks assessed/performed Overall Cognitive Status: History of cognitive impairments - at baseline                      Exercises General Exercises - Lower Extremity Ankle Circles/Pumps: AROM;Strengthening;10 reps Quad Sets: Strengthening;10 reps Gluteal Sets: Strengthening;10 reps Short Arc Quad: AROM;15 reps;AAROM Heel Slides: AROM;AAROM;10 reps Hip ABduction/ADduction: AROM;AAROM;10 reps    General Comments        Pertinent Vitals/Pain Pain Assessment:  (unable to rate, does have R hip pain with any movement)    Home Living                      Prior Function            PT Goals  (current goals can now be found in the care plan section) Progress towards PT goals: Progressing toward goals    Frequency  BID    PT Plan Current plan remains appropriate    Co-evaluation             End of Session Equipment Utilized During Treatment: Gait belt Activity Tolerance: Patient limited by pain Patient left: with bed alarm set     Time: 1344-1355 PT Time Calculation (min) (ACUTE ONLY): 11 min  Charges:  $Therapeutic Exercise: 8-22 mins                    G Codes:     Loran SentersGalen Zayan Delvecchio, PT, DPT [phone]  Malachi ProGalen R Zhana Jeangilles 10/23/2014, 4:00 PM

## 2014-10-24 LAB — GLUCOSE, CAPILLARY
GLUCOSE-CAPILLARY: 310 mg/dL — AB (ref 65–99)
Glucose-Capillary: 166 mg/dL — ABNORMAL HIGH (ref 65–99)
Glucose-Capillary: 263 mg/dL — ABNORMAL HIGH (ref 65–99)
Glucose-Capillary: 201 mg/dL — ABNORMAL HIGH (ref 65–99)

## 2014-10-24 LAB — BASIC METABOLIC PANEL
Anion gap: 7 (ref 5–15)
BUN: 10 mg/dL (ref 6–20)
CALCIUM: 8.2 mg/dL — AB (ref 8.9–10.3)
CO2: 23 mmol/L (ref 22–32)
CREATININE: 1 mg/dL (ref 0.44–1.00)
Chloride: 95 mmol/L — ABNORMAL LOW (ref 101–111)
GFR calc non Af Amer: 47 mL/min — ABNORMAL LOW (ref >60)
GFR, EST AFRICAN AMERICAN: 54 mL/min — AB (ref >60)
Glucose, Bld: 180 mg/dL — ABNORMAL HIGH (ref 65–99)
Potassium: 4.3 mmol/L (ref 3.5–5.1)
SODIUM: 125 mmol/L — AB (ref 135–145)

## 2014-10-24 LAB — CBC
HEMATOCRIT: 22.8 % — AB (ref 35.0–47.0)
Hemoglobin: 7.8 g/dL — ABNORMAL LOW (ref 12.0–16.0)
MCH: 32.2 pg (ref 26.0–34.0)
MCHC: 34.3 g/dL (ref 32.0–36.0)
MCV: 93.8 fL (ref 80.0–100.0)
Platelets: 150 10*3/uL (ref 150–440)
RBC: 2.43 MIL/uL — ABNORMAL LOW (ref 3.80–5.20)
RDW: 13.4 % (ref 11.5–14.5)
WBC: 10.4 10*3/uL (ref 3.6–11.0)

## 2014-10-24 LAB — PREPARE RBC (CROSSMATCH)

## 2014-10-24 LAB — ABO/RH: ABO/RH(D): A POS

## 2014-10-24 MED ORDER — SODIUM CHLORIDE 0.9 % IV SOLN
Freq: Once | INTRAVENOUS | Status: AC
  Administered 2014-10-24: 14:00:00 via INTRAVENOUS

## 2014-10-24 MED ORDER — ACETAMINOPHEN 325 MG PO TABS
650.0000 mg | ORAL_TABLET | Freq: Once | ORAL | Status: AC
  Administered 2014-10-24: 650 mg via ORAL

## 2014-10-24 MED ORDER — METFORMIN HCL 500 MG PO TABS
500.0000 mg | ORAL_TABLET | Freq: Two times a day (BID) | ORAL | Status: DC
Start: 2014-10-24 — End: 2014-10-25
  Administered 2014-10-24 – 2014-10-25 (×2): 500 mg via ORAL
  Filled 2014-10-24 (×2): qty 1

## 2014-10-24 NOTE — Progress Notes (Signed)
[facility] - [place] at [facility]   PATIENT NAME: Gina RancherLucille [name]    MR#:  [id number]  DATE OF BIRTH:  05-Sep-1917  SUBJECTIVE:  CHIEF COMPLAINT:   Chief Complaint  Patient presents with  . Hip Injury  came after a fall and fracture right hip. S/p surgery for right side hip fracture- 10/21/14  no new complains. REVIEW OF SYSTEMS:  CONSTITUTIONAL: No fever, fatigue or weakness.  EYES: No blurred or double vision.  EARS, NOSE, AND THROAT: No tinnitus or ear pain.  RESPIRATORY: No cough, shortness of breath, wheezing or hemoptysis.  CARDIOVASCULAR: No chest pain, orthopnea, edema.  GASTROINTESTINAL: No nausea, vomiting, diarrhea or abdominal pain.  GENITOURINARY: No dysuria, hematuria.  ENDOCRINE: No polyuria, nocturia,  HEMATOLOGY: No anemia, easy bruising or bleeding SKIN: No rash or lesion. MUSCULOSKELETAL: right hip joint pain or arthritis.   NEUROLOGIC: No tingling, numbness, weakness.  PSYCHIATRY: No anxiety or depression.   ROS  DRUG ALLERGIES:   Allergies  Allergen Reactions  . Fosamax [Alendronate Sodium] Other (See Comments)    Unknown    VITALS:  Blood pressure 102/51, pulse 66, temperature 98.6 F (37 C), temperature source Oral, resp. rate 18, height 5\' 5"  (1.651 m), weight 72.122 kg (159 lb), SpO2 98 %.  PHYSICAL EXAMINATION:  GENERAL:  81 y.o.-year-old patient lying in the bed with no acute distress.  EYES: Pupils equal, round, reactive to light and accommodation. No scleral icterus. Extraocular muscles intact.  HEENT: Head atraumatic, normocephalic. Oropharynx and nasopharynx clear.  NECK:  Supple, no jugular venous distention. No thyroid enlargement, no tenderness.  LUNGS: Normal breath sounds bilaterally, no wheezing, rales,rhonchi or crepitation. No use of accessory muscles of respiration.  CARDIOVASCULAR: S1, S2 normal. No murmurs, rubs, or gallops.  ABDOMEN: Soft, nontender, nondistended. Bowel sounds present. No  organomegaly or mass.  EXTREMITIES: No pedal edema, cyanosis, or clubbing. Right hip dressing present. NEUROLOGIC: Cranial nerves II through XII are intact. Muscle strength 4/5 in all extremities. Sensation intact. Gait not checked.  PSYCHIATRIC: The patient is alert and oriented x 3.  SKIN: No obvious rash, on nose thee is a lesion which is elevated with rough margins and variation in colour present- size 0.5 cm.  Physical Exam LABORATORY PANEL:   CBC  Recent Labs Lab 10/24/14 0343  WBC 10.4  HGB 7.8*  HCT 22.8*  PLT 150   ------------------------------------------------------------------------------------------------------------------  Chemistries   Recent Labs Lab 10/24/14 0343  NA 125*  K 4.3  CL 95*  CO2 23  GLUCOSE 180*  BUN 10  CREATININE 1.00  CALCIUM 8.2*   ------------------------------------------------------------------------------------------------------------------  Cardiac Enzymes No results for input(s): TROPONINI in the last 168 hours. ------------------------------------------------------------------------------------------------------------------  RADIOLOGY:  No results found.  ASSESSMENT AND PLAN:   Active Problems:   Hip fracture (HCC)   Pressure ulcer  #1Displaced intertrochanteric fracture right hip  S/p surgery- 10/21/14 Follow-up with orthopedics-Dr. Hyacinth MeekerMiller Pain management by ortho  PT and rehab placement.   Awaited insurance approval.  #Acute cystitis  Abnormal urinalysis   patient on IV Rocephin empirically  E coli sensitive to rocephin.  #3 hypertension home medication verapamil- stable.  #4 diabetes mellitus Initially Held her home medication metformin and provide sliding scale insulin Provide diabetic diet.   As blood sugar runningn high- start her metformin again.  #5 hypothyroidism Continue Synthroid home medication  #6 chronic dementia Currently patient is mentating okay Resume her home medications.  #7  anxiety Resume home medications Xanax.  # 8 anemia  Monitor,    Dropped to < 8 today- transfuse one unit of PRBC today.  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    As per her, family know about that, but due to her old age, they will not like to do anything- as it is not creating any trouble.  # 10. Hyponatremia   Fluid restriction and IV NS.   Slight improved.  Pt will need to go to rehab. All the records are reviewed and case discussed with Care Management/Social Workerr. Management plans discussed with the patient, family and they are in agreement.  CODE STATUS: DNR  TOTAL TIME TAKING CARE OF THIS PATIENT: 35 minutes.   POSSIBLE D/C IN 1 DAYS, DEPENDING ON CLINICAL CONDITION.   Altamese Dilling M.D on 10/24/2014   Between 7am to 6pm - Pager - [phone]  After 6pm go to [email or web address] - password [email or web address] [facility]  [place] [facility]  Office  [phone]  CC: Primary care physician; No primary care provider on file.  Note: This dictation was prepared with Dragon dictation along with smaller phrase technology. Any transcriptional errors that result from this process are unintentional.

## 2014-10-24 NOTE — Progress Notes (Signed)
Subjective:  Patient is postop day #3 status post surgery for right hip fracture. Patient reports pain as mild.  Patient is sitting up in a chair. Her hemoglobin is 7.8 today. Hematocrit is 22.8.  Objective:   VITALS:   Filed Vitals:   10/23/14 1619 10/23/14 1950 10/24/14 0520 10/24/14 0748  BP: 122/42 127/47 134/48 134/48  Pulse: 68 73 84 90  Temp: 99.4 F (37.4 C) 98.3 F (36.8 C) 98.4 F (36.9 C) 98.9 F (37.2 C)  TempSrc: Oral Oral Oral Oral  Resp: 16  18 17   Height:      Weight:      SpO2: 98% 99% 95% 96%    PHYSICAL EXAM:  Right hip dressing is clean dry and intact. Thigh compartments are soft and compressible. There is no erythema or significant swelling. Patient remains neurovascularly intact distally in her right lower extremity. She has palpable pedal pulses. Patient has intact sensation to light touch and can flex and extend her toes and dorsiflex and plantarflex her ankle.   LABS  Results for orders placed or performed during the hospital encounter of 10/20/14 (from the past 24 hour(s))  Glucose, capillary     Status: Abnormal   Collection Time: 10/23/14 11:22 AM  Result Value Ref Range   Glucose-Capillary 298 (H) 65 - 99 mg/dL  Glucose, capillary     Status: Abnormal   Collection Time: 10/23/14  4:15 PM  Result Value Ref Range   Glucose-Capillary 265 (H) 65 - 99 mg/dL   Comment 1 Notify RN   Glucose, capillary     Status: Abnormal   Collection Time: 10/23/14  9:54 PM  Result Value Ref Range   Glucose-Capillary 239 (H) 65 - 99 mg/dL   Comment 1 Notify RN   CBC     Status: Abnormal   Collection Time: 10/24/14  3:43 AM  Result Value Ref Range   WBC 10.4 3.6 - 11.0 K/uL   RBC 2.43 (L) 3.80 - 5.20 MIL/uL   Hemoglobin 7.8 (L) 12.0 - 16.0 g/dL   HCT 16.122.8 (L) 09.635.0 - 04.547.0 %   MCV 93.8 80.0 - 100.0 fL   MCH 32.2 26.0 - 34.0 pg   MCHC 34.3 32.0 - 36.0 g/dL   RDW 40.913.4 81.111.5 - 91.414.5 %   Platelets 150 150 - 440 K/uL  Basic metabolic panel     Status: Abnormal    Collection Time: 10/24/14  3:43 AM  Result Value Ref Range   Sodium 125 (L) 135 - 145 mmol/L   Potassium 4.3 3.5 - 5.1 mmol/L   Chloride 95 (L) 101 - 111 mmol/L   CO2 23 22 - 32 mmol/L   Glucose, Bld 180 (H) 65 - 99 mg/dL   BUN 10 6 - 20 mg/dL   Creatinine, Ser 7.821.00 0.44 - 1.00 mg/dL   Calcium 8.2 (L) 8.9 - 10.3 mg/dL   GFR calc non Af Amer 47 (L) >60 mL/min   GFR calc Af Amer 54 (L) >60 mL/min   Anion gap 7 5 - 15  Glucose, capillary     Status: Abnormal   Collection Time: 10/24/14  7:46 AM  Result Value Ref Range   Glucose-Capillary 166 (H) 65 - 99 mg/dL   Comment 1 Notify RN     No results found.  Assessment/Plan: 3 Days Post-Op   Active Problems:   Hip fracture (HCC)   Pressure ulcer  Patient doing well postop. I have ordered 1 unit of packed red blood cells for  transfusion for hemoglobin less than 8 postop. Patient's hemoglobin has been trending downward since surgery. Continue physical therapy while an inpatient. Patient is progressing well from the orthopedic standpoint.   Juanell Fairly , MD 10/24/2014, 10:53 AM

## 2014-10-24 NOTE — Progress Notes (Addendum)
Pt complaining of itching isolated to left hip. Pt is receiving one unit of blood so paged and spoke to Dr. Karrie MeresVichhani. Asked what he thought. He said to watch closely & take set of vitals. He did not want to give Benedryl d/t her age & because of its sedating effect.

## 2014-10-24 NOTE — Progress Notes (Signed)
[facility] - [place] at [facility]   PATIENT NAME: Gina [name]    MR#:  [id number]  DATE OF BIRTH:  1917-01-26  SUBJECTIVE:  CHIEF COMPLAINT:   Chief Complaint  Patient presents with  . Hip Injury  came after a fall and fracture right hip. S/p surgery for right side hip fracture- 10/21/14  no new complains. REVIEW OF SYSTEMS:  CONSTITUTIONAL: No fever, fatigue or weakness.  EYES: No blurred or double vision.  EARS, NOSE, AND THROAT: No tinnitus or ear pain.  RESPIRATORY: No cough, shortness of breath, wheezing or hemoptysis.  CARDIOVASCULAR: No chest pain, orthopnea, edema.  GASTROINTESTINAL: No nausea, vomiting, diarrhea or abdominal pain.  GENITOURINARY: No dysuria, hematuria.  ENDOCRINE: No polyuria, nocturia,  HEMATOLOGY: No anemia, easy bruising or bleeding SKIN: No rash or lesion. MUSCULOSKELETAL: right hip joint pain or arthritis.   NEUROLOGIC: No tingling, numbness, weakness.  PSYCHIATRY: No anxiety or depression.   ROS  DRUG ALLERGIES:   Allergies  Allergen Reactions  . Fosamax [Alendronate Sodium] Other (See Comments)    Unknown    VITALS:  Blood pressure 113/50, pulse 69, temperature 99.2 F (37.3 C), temperature source Oral, resp. rate 18, height  (1.651 m), weight 72.122 kg (159 lb), SpO2 98 %.  PHYSICAL EXAMINATION:  GENERAL:  81 y.o.-year-old patient lying in the bed with no acute distress.  EYES: Pupils equal, round, reactive to light and accommodation. No scleral icterus. Extraocular muscles intact.  HEENT: Head atraumatic, normocephalic. Oropharynx and nasopharynx clear.  NECK:  Supple, no jugular venous distention. No thyroid enlargement, no tenderness.  LUNGS: Normal breath sounds bilaterally, no wheezing, rales,rhonchi or crepitation. No use of accessory muscles of respiration.  CARDIOVASCULAR: S1, S2 normal. No murmurs, rubs, or gallops.  ABDOMEN: Soft, nontender, nondistended. Bowel sounds present. No  organomegaly or mass.  EXTREMITIES: No pedal edema, cyanosis, or clubbing. Right hip dressing present. NEUROLOGIC: Cranial nerves II through XII are intact. Muscle strength 4/5 in all extremities. Sensation intact. Gait not checked.  PSYCHIATRIC: The patient is alert and oriented x 3.  SKIN: No obvious rash, on nose thee is a lesion which is elevated with rough margins and variation in colour present- size 0.5 cm.  Physical Exam LABORATORY PANEL:   CBC  Recent Labs Lab 10/24/14 0343  WBC 10.4  HGB 7.8*  HCT 22.8*  PLT 150   ------------------------------------------------------------------------------------------------------------------  Chemistries   Recent Labs Lab 10/24/14 0343  NA 125*  K 4.3  CL 95*  CO2 23  GLUCOSE 180*  BUN 10  CREATININE 1.00  CALCIUM 8.2*   ------------------------------------------------------------------------------------------------------------------  Cardiac Enzymes No results for input(s): TROPONINI in the last 168 hours. ------------------------------------------------------------------------------------------------------------------  RADIOLOGY:  No results found.  ASSESSMENT AND PLAN:   Active Problems:   Hip fracture (HCC)   Pressure ulcer  #1Displaced intertrochanteric fracture right hip  S/p surgery- 10/21/14 Follow-up with orthopedics-Dr. Hyacinth Meeker Pain management by ortho  PT and rehab placement.   Awaited insurance approval.  #Acute cystitis  Abnormal urinalysis   patient on IV Rocephin empirically  E coli sensitive to rocephin.  #3 hypertension home medication verapamil- stable.  #4 diabetes mellitus Initially Held her home medication metformin and provide sliding scale insulin Provide diabetic diet.  #5 hypothyroidism Continue Synthroid home medication  #6 chronic dementia Currently patient is mentating okay Resume her home medications.  #7 anxiety Resume home medications Xanax.  # 8 anemia    Monitor, no need for transfusion for now.  #9 squemous cell  carcinoma on nose   May need to follow with plastic surgeon as out pt, if wish to persue surgery   i have discussed with family ( grand daughter).    As per her, family know about that, but due to her old age, they will not like to do anything- as it is not creating any trouble.  # 10. Hyponatremia   Fluid restriction and IV NS.  Pt will need to go to rehab. All the records are reviewed and case discussed with Care Management/Social Workerr. Management plans discussed with the patient, family and they are in agreement.  CODE STATUS: DNR  TOTAL TIME TAKING CARE OF THIS PATIENT: 35 minutes.   POSSIBLE D/C IN 2-3 DAYS, DEPENDING ON CLINICAL CONDITION.   Altamese DillingVACHHANI, [place] Rollyson M.D on 10/24/2014   Between 7am to 6pm - Pager - [phone]  After 6pm go to [email or web address] - password [email or web address] [email or web address]  [place] [place] [facility]  Office  [phone]  CC: Primary care physician; No primary care provider on file.  Note: This dictation was prepared with Dragon dictation along with smaller phrase technology. Any transcriptional errors that result from this process are unintentional.

## 2014-10-24 NOTE — Progress Notes (Signed)
Physical Therapy Treatment Patient Details Name: Gina [name] MRN: [id number] DOB: [birth year] Today's Date: 10/24/2014    History of Present Illness Pt with fall, R hip fx and subsequent ORIF    PT Comments    Pt continues to be pleasant and despite pain willing to participate but she is very limited with what she is functionally able to do and ultimately is very limited.  She continues to struggle with following instructions during standing/transfers/"gait" secondary to mental status.  Follow Up Recommendations  SNF     Equipment Recommendations       Recommendations for Other Services       Precautions / Restrictions Precautions Precautions: Fall Restrictions Weight Bearing Restrictions: Yes RLE Weight Bearing: Partial weight bearing    Mobility  Bed Mobility Overal bed mobility: Needs Assistance Bed Mobility: Supine to Sit     Supine to sit: Mod assist;Max assist     General bed mobility comments: Pt continues to c/o R hip pain with any movement, she is able to assist minimally to get to EOB  Transfers Overall transfer level: Needs assistance Equipment used: Rolling walker (2 wheeled) Transfers: Sit to/from Stand Sit to Stand: Max assist         General transfer comment: Pt is able to maintain standing balance with heavy walker use (and min/mod PT assist) for ~30 seconds statically  Ambulation/Gait Ambulation/Gait assistance: Max assist Ambulation Distance (Feet): 2 Feet Assistive device: Rolling walker (2 wheeled)       General Gait Details: Pt is not really able to ambulate, though she did attempt to move each LE more than she previously had been able.  She took ~2 steps but again sits before getting squared to the chair and needs max assist to insure that she does get into the chair safely.   Stairs            Wheelchair Mobility    Modified Rankin (Stroke Patients Only)       Balance                                     Cognition Arousal/Alertness:  (pleasantly confused, awake) Behavior During Therapy: WFL for tasks assessed/performed Overall Cognitive Status: History of cognitive impairments - at baseline                      Exercises General Exercises - Lower Extremity Ankle Circles/Pumps: AROM;Strengthening;10 reps Quad Sets: Strengthening;10 reps Gluteal Sets: Strengthening;10 reps Short Arc Quad: AROM;15 reps;AAROM Heel Slides: AROM;AAROM;10 reps Hip ABduction/ADduction: AROM;AAROM;10 reps    General Comments        Pertinent Vitals/Pain Pain Assessment:  (clearly has pain, unable to rate)    Home Living                      Prior Function            PT Goals (current goals can now be found in the care plan section) Progress towards PT goals: Progressing toward goals    Frequency  BID    PT Plan Current plan remains appropriate    Co-evaluation             End of Session Equipment Utilized During Treatment: Gait belt Activity Tolerance: Patient limited by pain Patient left: with chair alarm set     Time: 4098-1191 PT Time Calculation (min) (ACUTE ONLY): 28  min  Charges:  $Gait Training: 8-22 mins $Therapeutic Exercise: 8-22 mins                    G Codes:     Loran SentersGalen Brecken Walth, PT, DPT [phone]  Malachi ProGalen R Smokey Melott 10/24/2014, 10:53 AM

## 2014-10-24 NOTE — Care Management Important Message (Signed)
Important Message  Patient Details  Name: Gina [name] MRN: [id number] Date of Birth: September 19, 1917   Medicare Important Message Given:  Yes-third notification given    Fauna Neuner A, RN 10/24/2014, 9:54 AM

## 2014-10-24 NOTE — Progress Notes (Signed)
Physical Therapy Treatment Patient Details Name: Gina BossLucille H Gasbarro MRN: [id number] DOB: 1917/05/20 Today's Date: 10/24/2014    History of Present Illness Pt with fall, R hip fx and subsequent ORIF    PT Comments    Pt continues to need heavy assist with transfers.  She struggles to follow instructions consistently and ultimately is not safe with trying to step/stand.  She remains pleasant, per usual, but is till confused, weak and pain limited.  Follow Up Recommendations  SNF     Equipment Recommendations       Recommendations for Other Services       Precautions / Restrictions Precautions Precautions: Fall Restrictions RLE Weight Bearing: Partial weight bearing    Mobility  Bed Mobility Overal bed mobility: Needs Assistance Bed Mobility: Sit to Supine     Supine to sit: Mod assist;Max assist     General bed mobility comments: pt needs assist, unable to do much to help  Transfers Overall transfer level: Needs assistance Equipment used: Rolling walker (2 wheeled) Transfers: Sit to/from Stand Sit to Stand: Max assist         General transfer comment: 3 standing attempts, on the first 2 pt does not get weight forward despite much cuing and needs heavy cuing and assis to get up  Ambulation/Gait Ambulation/Gait assistance: Max assist Ambulation Distance (Feet): 2 Feet Assistive device: Rolling walker (2 wheeled)       General Gait Details: Again unable to be very effective during attempts at stepping.  She is able to take one small step on each side, but is very unsteady, fearful and generally unable to follow cues appropriately and is therefore unsafe.   Stairs            Wheelchair Mobility    Modified Rankin (Stroke Patients Only)       Balance                                    Cognition Arousal/Alertness:  (confused) Behavior During Therapy: WFL for tasks assessed/performed Overall Cognitive Status: History of cognitive  impairments - at baseline                      Exercises      General Comments        Pertinent Vitals/Pain Pain Assessment:  (as always c/o pain with R LE movement)    Home Living                      Prior Function            PT Goals (current goals can now be found in the care plan section) Progress towards PT goals: Progressing toward goals    Frequency  BID    PT Plan Current plan remains appropriate    Co-evaluation             End of Session Equipment Utilized During Treatment: Gait belt Activity Tolerance: Patient limited by pain Patient left: with bed alarm set     Time: 4098-11911238-1250 PT Time Calculation (min) (ACUTE ONLY): 12 min  Charges:  $Therapeutic Activity: 8-22 mins                    G Codes:     Loran SentersGalen [place], PT, DPT [phone]  Malachi ProGalen R Shaheen Star 10/24/2014, 2:36 PM

## 2014-10-24 NOTE — Progress Notes (Signed)
Per MD patient is not stable for D/C today and is getting a unit of blood. Plan is [facility]. Clinical Social Worker (CSW) will continue to follow and assist as needed.   Jetta LoutBailey Morgan, LCSWA [phone]

## 2014-10-25 LAB — CBC
HCT: 25.6 % — ABNORMAL LOW (ref 35.0–47.0)
HEMOGLOBIN: 9 g/dL — AB (ref 12.0–16.0)
MCH: 32.3 pg (ref 26.0–34.0)
MCHC: 35.2 g/dL (ref 32.0–36.0)
MCV: 91.8 fL (ref 80.0–100.0)
Platelets: 148 10*3/uL — ABNORMAL LOW (ref 150–440)
RBC: 2.79 MIL/uL — AB (ref 3.80–5.20)
RDW: 14.8 % — ABNORMAL HIGH (ref 11.5–14.5)
WBC: 8.5 10*3/uL (ref 3.6–11.0)

## 2014-10-25 LAB — BASIC METABOLIC PANEL
ANION GAP: 6 (ref 5–15)
BUN: 11 mg/dL (ref 6–20)
CHLORIDE: 98 mmol/L — AB (ref 101–111)
CO2: 22 mmol/L (ref 22–32)
Calcium: 8.2 mg/dL — ABNORMAL LOW (ref 8.9–10.3)
Creatinine, Ser: 1 mg/dL (ref 0.44–1.00)
GFR calc Af Amer: 54 mL/min — ABNORMAL LOW (ref >60)
GFR calc non Af Amer: 47 mL/min — ABNORMAL LOW (ref >60)
GLUCOSE: 164 mg/dL — AB (ref 65–99)
POTASSIUM: 4.2 mmol/L (ref 3.5–5.1)
Sodium: 126 mmol/L — ABNORMAL LOW (ref 135–145)

## 2014-10-25 LAB — GLUCOSE, CAPILLARY
GLUCOSE-CAPILLARY: 160 mg/dL — AB (ref 65–99)
Glucose-Capillary: 234 mg/dL — ABNORMAL HIGH (ref 65–99)

## 2014-10-25 NOTE — Progress Notes (Signed)
[facility] is medically stable [facility]. [facility]. [facility] [employer] [facility] however [facility] can still [facility] today because [facility] is very likely to approve. RN will call report and arrange EMS for transport. Clinical Child psychotherapistocial Worker (CSW) prepared D/C packet and [facility]. Patient is aware of above. Patient's daughter Dewayne Hatchnn is at bedside and aware of above. Please reconsult if future social work needs arise. CSW signing off.   Jetta LoutBailey Morgan, LCSWA [phone]

## 2014-10-25 NOTE — Progress Notes (Signed)
Pt is alert, no complains. Sitting in chair- enjoying breakfast. Daughter in room.  Received Blood transfusion yesterday.  RS- no crepitation. No wheezing.   Plan: S/p hip fracture- and sx.   Discharge to rehab today. Updated discharge summary. Please check it for further details.

## 2014-10-25 NOTE — Progress Notes (Signed)
Physical Therapy Treatment Patient Details Name: Gina [name] MRN: [id number] DOB: 06/22/1917 Today's Date: 10/25/2014    History of Present Illness Pt with fall, R hip fx and subsequent ORIF    PT Comments    Pt takes increased time to follow instructions/grasp technique, but does so with increased instruction/tactile cues. Pt complains of pain with right lower extremity movement, but tolerates a little increased range with continued work. Pt already prepared for transfer; therefore, limited session to bed exercises. Pt to be discharge to skilled nursing facility today.   Follow Up Recommendations  SNF     Equipment Recommendations       Recommendations for Other Services       Precautions / Restrictions Precautions Precautions: Fall Restrictions Weight Bearing Restrictions: Yes RLE Weight Bearing: Partial weight bearing    Mobility  Bed Mobility               General bed mobility comments: Not tested; pt in bed and prepared for discharge  Transfers                    Ambulation/Gait                 Stairs            Wheelchair Mobility    Modified Rankin (Stroke Patients Only)       Balance                                    Cognition Arousal/Alertness: Awake/alert Behavior During Therapy: WFL for tasks assessed/performed Overall Cognitive Status: History of cognitive impairments - at baseline       Memory: Decreased recall of precautions;Decreased short-term memory              Exercises General Exercises - Lower Extremity Ankle Circles/Pumps: AROM;Both;20 reps;Supine Quad Sets: Strengthening;Both;20 reps;Supine (initially difficulty grasping concept; good once understood) Gluteal Sets: Strengthening;Both;20 reps;Supine Short Arc Quad: AROM;Both;20 reps;Supine Heel Slides: AAROM;Both;20 reps;Supine Hip ABduction/ADduction: AAROM;Both;20 reps;Supine Other Exercises Other Exercises: Adductor  squeeze 20x BLE    General Comments        Pertinent Vitals/Pain Pain Assessment: 0-10 Pain Score: 5  Pain Location: R hip    Home Living                      Prior Function            PT Goals (current goals can now be found in the care plan section) Progress towards PT goals: Progressing toward goals (slowly)    Frequency  BID    PT Plan Current plan remains appropriate    Co-evaluation             End of Session   Activity Tolerance: Patient tolerated treatment well;Patient limited by pain (limited range RLE; complains of pain) Patient left: in bed;with call bell/phone within reach;with bed alarm set;with family/visitor present     Time: [id number] PT Time Calculation (min) (ACUTE ONLY): 25 min  Charges:  $Therapeutic Exercise: 23-37 mins                    G Codes:      Kristeen MissHeidi Elizabeth Bishop 10/25/2014, 11:46 AM

## 2014-10-25 NOTE — Care Management Note (Signed)
Case Management Note  Patient Details  Name: Gina [name] MRN: [id number] Date of Birth: 06-Jan-1917  Subjective/Objective:  Discharging to [facility] today                  Action/Plan:   Expected Discharge Date:  10/24/14               Expected Discharge Plan:  Skilled Nursing Facility  In-House Referral:  Clinical Social Work  Discharge planning Services     Post Acute Care Choice:    Choice offered to:     DME Arranged:    DME Agency:     HH Arranged:    HH Agency:     Status of Service:  In process, will continue to follow  Medicare Important Message Given:  Yes-third notification given Date Medicare IM Given:    Medicare IM give by:    Date Additional Medicare IM Given:    Additional Medicare Important Message give by:     If discussed at Long Length of Stay Meetings, dates discussed:    Additional Comments:  Marily MemosLisa M Amberlee Garvey, RN 10/25/2014, 10:51 AM

## 2014-10-25 NOTE — Clinical Social Work Placement (Signed)
   CLINICAL SOCIAL WORK PLACEMENT  NOTE  Date:  10/25/2014  Patient Details  Name: Gina [name] MRN: [id number] Date of Birth: 1917/10/16  Clinical Social Work is seeking post-discharge placement for this patient at the Skilled  Nursing Facility level of care (*CSW will initial, date and re-position this form in  chart as items are completed):  Yes   Patient/family provided with [place] Clinical Social Work Department's list of facilities offering this level of care within the geographic area requested by the patient (or if unable, by the patient's family).  Yes   Patient/family informed of their freedom to choose among providers that offer the needed level of care, that participate in Medicare, Medicaid or managed care program needed by the patient, have an available bed and are willing to accept the patient.  Yes   Patient/family informed of [facility] ownership interest in [facility] and [facility], as well as of the fact that they are under no obligation to receive care at these facilities.  PASRR submitted to EDS on       PASRR number received on       Existing PASRR number confirmed on 10/21/14     FL2 transmitted to all facilities in geographic area requested by pt/family on 10/21/14     FL2 transmitted to all facilities within larger geographic area on       Patient informed that his/her managed care company has contracts with or will negotiate with certain facilities, including the following:        Yes   Patient/family informed of bed offers received.  Patient chooses bed at  [facility] )     Physician recommends and patient chooses bed at      Patient to be transferred to  [facility] ) on 10/25/14.  Patient to be transferred to facility by  [facility] )     Patient family notified on 10/25/14 of transfer.  Name of family member notified:   (Patient's daughter Dewayne Hatchnn is aware of D/C today. )     PHYSICIAN        Additional Comment:    _______________________________________________ Haig ProphetMorgan, Kaleen Rochette G, LCSW 10/25/2014, 10:27 AM

## 2014-10-25 NOTE — Progress Notes (Signed)
Inpatient Diabetes Program Recommendations  AACE/ADA: New Consensus Statement on Inpatient Glycemic Control (2015)  Target Ranges:  Prepandial:   less than 140 mg/dL      Peak postprandial:   less than 180 mg/dL (1-2 hours)      Critically ill patients:  140 - 180 mg/dL   Review of Glycemic Control  Results for Gina [name], Gina [name] (MRN [id number]) as of 10/25/2014 09:44  Ref. Range 10/24/2014 07:46 10/24/2014 11:18 10/24/2014 15:57 10/24/2014 22:15 10/25/2014 07:32  Glucose-Capillary Latest Ref Range: 65-99 mg/dL 409166 ([name]) 811310 ([name]) 914263 ([name]) 201 ([name]) 160 ([name])    Diabetes history: Type 2 Outpatient Diabetes medications: Metformin 500mg  bid Current orders for Inpatient glycemic control: Metformin 500mg  bid, Novolog 0-9 units tid  Inpatient Diabetes Program Recommendations:  Consider ordering Novolog 2 units tid with meals and Novolog 0-5 units qhs if patient does not get discharged. Continue Novolog correction as ordered tid.   Susette RacerJulie Eurika Sandy, RN, BA, MHA, CDE Diabetes Coordinator Inpatient Diabetes Program  [phone] (Team Pager) [phone] [facility] Office) 10/25/2014 9:48 AM

## 2014-10-26 LAB — TYPE AND SCREEN
ABO/RH(D): A POS
Antibody Screen: NEGATIVE
Unit division: 0

## 2014-11-06 ENCOUNTER — Emergency Department: Payer: Medicare HMO

## 2014-11-06 ENCOUNTER — Emergency Department
Admission: EM | Admit: 2014-11-06 | Discharge: 2014-11-06 | Disposition: A | Discharge status: Home / Self Care | Payer: Medicare HMO | Attending: Emergency Medicine | Admitting: Emergency Medicine

## 2014-11-06 DIAGNOSIS — Y92129 Unspecified place in nursing home as the place of occurrence of the external cause: Secondary | ICD-10-CM | POA: Insufficient documentation

## 2014-11-06 DIAGNOSIS — I10 Essential (primary) hypertension: Secondary | ICD-10-CM | POA: Diagnosis not present

## 2014-11-06 DIAGNOSIS — Y998 Other external cause status: Secondary | ICD-10-CM | POA: Diagnosis not present

## 2014-11-06 DIAGNOSIS — Z043 Encounter for examination and observation following other accident: Secondary | ICD-10-CM | POA: Diagnosis present

## 2014-11-06 DIAGNOSIS — Z792 Long term (current) use of antibiotics: Secondary | ICD-10-CM | POA: Diagnosis not present

## 2014-11-06 DIAGNOSIS — Z79899 Other long term (current) drug therapy: Secondary | ICD-10-CM | POA: Diagnosis not present

## 2014-11-06 DIAGNOSIS — W19XXXA Unspecified fall, initial encounter: Secondary | ICD-10-CM

## 2014-11-06 DIAGNOSIS — Y9389 Activity, other specified: Secondary | ICD-10-CM | POA: Insufficient documentation

## 2014-11-06 DIAGNOSIS — W1839XA Other fall on same level, initial encounter: Secondary | ICD-10-CM | POA: Insufficient documentation

## 2014-11-06 DIAGNOSIS — E119 Type 2 diabetes mellitus without complications: Secondary | ICD-10-CM | POA: Diagnosis not present

## 2014-11-06 NOTE — Discharge Instructions (Signed)
Patient had an x-ray of her pelvis and hip which showed no abnormalities. The rest of her exam was benign. Please have her follow-up with her physician as needed

## 2014-11-06 NOTE — ED Notes (Signed)
BIB EMS from [facility] for Fall that was unwitnessed. Pt denies pain at this time.

## 2014-11-06 NOTE — ED Provider Notes (Signed)
[facility] Emergency Department Provider Note  ____________________________________________  Time seen: On arrival, via EMS  I have reviewed the triage vital signs and the nursing notes.   HISTORY  Chief Complaint Fall    HPI Gina [name] is a 81 y.o. female who presents after a fall at her nursing home. Patient recently had hip replacement of the right hip and is at [facility] for rehabilitation. She apparently had an unwitnessed fall. She denies pain reports she feels well. She has a history of frequent falls. She is not on blood thinners. She has full strength in all her extremities and demonstrates this to me vigorously     Past Medical History  Diagnosis Date  . Diabetes mellitus without complication (HCC)   . Hypertension   . Dementia   . Anxiety   . GERD (gastroesophageal reflux disease)   . Osteoporosis   . Hypothyroidism     Patient Active Problem List   Diagnosis Date Noted  . Hip fracture (HCC) 10/20/2014  . Pressure ulcer 10/20/2014  . HYPOTHYROIDISM 04/10/2006  . DIABETES MELLITUS, WITH GASTROPARESIS 04/10/2006  . HYPERCHOLESTEROLEMIA 04/10/2006  . DEMENTIA 04/10/2006  . ANXIETY 04/10/2006  . HYPERTENSION 04/10/2006  . ATHEROSCLEROTIC CARDIOVASCULAR DISEASE 04/10/2006  . GERD 04/10/2006  . CONSTIPATION 04/10/2006  . DIABETES MELLITUS, TYPE II 05/09/2005    Past Surgical History  Procedure Laterality Date  . Intramedullary (im) nail intertrochanteric Right 10/21/2014    Procedure: INTRAMEDULLARY (IM) NAIL INTERTROCHANTRIC;  Surgeon: Deeann Saint, MD;  Location: [facility] ORS;  Service: Orthopedics;  Laterality: Right;    Current Outpatient Rx  Name  Route  Sig  Dispense  Refill  . ALPRAZolam (XANAX) 0.25 MG tablet   Oral   Take 1 tablet (0.25 mg total) by mouth 2 (two) times daily as needed for anxiety.   30 tablet   0   . ALPRAZolam (XANAX) 0.5 MG tablet   Oral   Take 1 tablet (0.5 mg total) by mouth at  bedtime.   10 tablet   0   . atorvastatin (LIPITOR) 10 MG tablet   Oral   Take 10 mg by mouth daily.         . calcium-vitamin D (OSCAL WITH D) 500-200 MG-UNIT tablet   Oral   Take 1 tablet by mouth 2 (two) times daily.   60 tablet   0   . cefUROXime (CEFTIN) 250 MG tablet   Oral   Take 1 tablet (250 mg total) by mouth 2 (two) times daily with a meal. For 2 days   4 tablet   0   . ferrous sulfate 325 (65 FE) MG tablet   Oral   Take 1 tablet (325 mg total) by mouth 2 (two) times daily with a meal.   60 tablet   3   . HYDROcodone-acetaminophen (NORCO/VICODIN) 5-325 MG tablet   Oral   Take 1-2 tablets by mouth every 6 (six) hours as needed for moderate pain.   30 tablet   0   . levothyroxine (SYNTHROID, LEVOTHROID) 137 MCG tablet   Oral   Take 137 mcg by mouth daily before breakfast.         . loperamide (IMODIUM) 2 MG capsule   Oral   Take 2 mg by mouth as needed for diarrhea or loose stools.         . metFORMIN (GLUCOPHAGE) 500 MG tablet   Oral   Take 500 mg by mouth 2 (two) times daily with  a meal.         . neomycin-bacitracin-polymyxin (NEOSPORIN) ointment   Topical   Apply 1 application topically as needed for wound care. apply to eye         . nystatin-triamcinolone (MYCOLOG II) cream   Topical   Apply 1 application topically as needed.         Marland Kitchen. omeprazole (PRILOSEC) 20 MG capsule   Oral   Take 20 mg by mouth daily.         Marland Kitchen. senna (SENOKOT) 8.6 MG TABS tablet   Oral   Take 1 tablet (8.6 mg total) by mouth daily as needed for mild constipation.   30 each   0   . sodium chloride (OCEAN) 0.65 % SOLN nasal spray   Each Nare   Place 2 sprays into both nostrils 4 (four) times daily as needed for congestion.         . sodium chloride 1 G tablet   Oral   Take 1 g by mouth 2 (two) times daily with a meal.         . traZODone (DESYREL) 50 MG tablet   Oral   Take 50 mg by mouth at bedtime.         . verapamil (VERELAN PM) 240 MG  24 hr capsule   Oral   Take 240 mg by mouth daily at 12 noon.         . vitamin C (ASCORBIC ACID) 500 MG tablet   Oral   Take 500 mg by mouth daily.           Allergies Fosamax  No family history on file.  Social History Social History  Substance Use Topics  . Smoking status: Never Smoker   . Smokeless tobacco: Never Used  . Alcohol Use: No    Review of Systems  Constitutional: Negative for fever. Eyes: Negative for visual changes. ENT: Negative for sore throat Cardiovascular: Negative for chest pain. Respiratory: Negative for shortness of breath. Gastrointestinal: Negative for abdominal pain, vomiting and diarrhea. Genitourinary: Negative for dysuria. Musculoskeletal: Negative for back pain. Skin: Negative for rash. Neurological: Negative for headaches or focal weakness Psychiatric: No anxiety    ____________________________________________   PHYSICAL EXAM:  VITAL SIGNS: ED Triage Vitals  Enc Vitals Group     BP 11/06/14 1646 186/82 mmHg     Pulse Rate 11/06/14 1646 78     Resp 11/06/14 1646 18     Temp 11/06/14 1646 98.5 F (36.9 C)     Temp Source 11/06/14 1646 Oral     SpO2 11/06/14 1646 99 %     Weight 11/06/14 1646 165 lb 5.5 oz (75 kg)     Height 11/06/14 1646 5\' 5"  (1.651 m)     Head Cir --      Peak Flow --      Pain Score --      Pain Loc --      Pain Edu? --      Excl. in GC? --      Constitutional: Alert, Well appearing and in no distress. Pleasant and interactive Eyes: Conjunctivae are normal.  ENT   Head: Normocephalic and atraumatic.   Mouth/Throat: Mucous membranes are moist. Cardiovascular: Normal rate, regular rhythm. Normal and symmetric distal pulses are present in all extremities. No murmurs, rubs, or gallops. Respiratory: Normal respiratory effort without tachypnea nor retractions. Breath sounds are clear and equal bilaterally.  Gastrointestinal: Soft and non-tender in all quadrants. No  distention. There is no CVA  tenderness. Genitourinary: deferred Musculoskeletal: Patient with full range of motion of all extremities. She has mild discomfort with axial load on right lower extremity secondary to recent surgery. Neurologic:  Normal speech and language. No gross focal neurologic deficits are appreciated. Skin:  Skin is warm, dry and intact. No rash noted. Psychiatric: Mood and affect are normal. Patient exhibits appropriate insight and judgment.  ____________________________________________    LABS (pertinent positives/negatives)  Labs Reviewed - No data to display  ____________________________________________   EKG  None  ____________________________________________    RADIOLOGY I have personally reviewed any xrays that were ordered on this patient: X-ray hip shows no new abnormalities  ____________________________________________   PROCEDURES  Procedure(s) performed: none  Critical Care performed: none  ____________________________________________   INITIAL IMPRESSION / ASSESSMENT AND PLAN / ED COURSE  Pertinent labs & imaging results that were available during my care of the patient were reviewed by me and considered in my medical decision making (see chart for details).  Patient thoroughly examined, no injury noted. X-ray of hip shows no new fracture or displacement of hardware. Discussed with patient's daughter who agrees with no further imaging  ____________________________________________   FINAL CLINICAL IMPRESSION(S) / ED DIAGNOSES  Final diagnoses:  Fall, initial encounter     Jene Every, MD 11/06/14 1824

## 2014-11-06 NOTE — ED Notes (Signed)
Nurse called report to Clinical cytogeneticisteggy at [facility]. Pt transported by EMS back to [facility].

## 2015-01-05 DIAGNOSIS — S72001D Fracture of unspecified part of neck of right femur, subsequent encounter for closed fracture with routine healing: Secondary | ICD-10-CM | POA: Diagnosis not present

## 2015-01-05 DIAGNOSIS — N189 Chronic kidney disease, unspecified: Secondary | ICD-10-CM | POA: Diagnosis not present

## 2015-01-05 DIAGNOSIS — Z7984 Long term (current) use of oral hypoglycemic drugs: Secondary | ICD-10-CM | POA: Diagnosis not present

## 2015-01-05 DIAGNOSIS — E119 Type 2 diabetes mellitus without complications: Secondary | ICD-10-CM | POA: Diagnosis not present

## 2015-01-05 DIAGNOSIS — R32 Unspecified urinary incontinence: Secondary | ICD-10-CM | POA: Diagnosis not present

## 2015-01-05 DIAGNOSIS — I129 Hypertensive chronic kidney disease with stage 1 through stage 4 chronic kidney disease, or unspecified chronic kidney disease: Secondary | ICD-10-CM | POA: Diagnosis not present

## 2015-01-05 DIAGNOSIS — F419 Anxiety disorder, unspecified: Secondary | ICD-10-CM | POA: Diagnosis not present

## 2015-01-05 DIAGNOSIS — R296 Repeated falls: Secondary | ICD-10-CM | POA: Diagnosis not present

## 2015-01-05 DIAGNOSIS — L89152 Pressure ulcer of sacral region, stage 2: Secondary | ICD-10-CM | POA: Diagnosis not present

## 2015-01-05 DIAGNOSIS — R159 Full incontinence of feces: Secondary | ICD-10-CM | POA: Diagnosis not present

## 2015-01-05 DIAGNOSIS — Z4802 Encounter for removal of sutures: Secondary | ICD-10-CM | POA: Diagnosis not present

## 2015-01-05 DIAGNOSIS — F0390 Unspecified dementia without behavioral disturbance: Secondary | ICD-10-CM | POA: Diagnosis not present

## 2015-02-11 ENCOUNTER — Inpatient Hospital Stay
Admission: EM | Admit: 2015-02-11 | Discharge: 2015-02-14 | DRG: 580 | Disposition: A | Discharge status: Skilled Nursing Facility | Payer: PPO | Attending: Internal Medicine | Admitting: Internal Medicine

## 2015-02-11 DIAGNOSIS — L97119 Non-pressure chronic ulcer of right thigh with unspecified severity: Secondary | ICD-10-CM | POA: Diagnosis not present

## 2015-02-11 DIAGNOSIS — K219 Gastro-esophageal reflux disease without esophagitis: Secondary | ICD-10-CM | POA: Diagnosis not present

## 2015-02-11 DIAGNOSIS — E46 Unspecified protein-calorie malnutrition: Secondary | ICD-10-CM | POA: Diagnosis not present

## 2015-02-11 DIAGNOSIS — E119 Type 2 diabetes mellitus without complications: Secondary | ICD-10-CM | POA: Diagnosis not present

## 2015-02-11 DIAGNOSIS — E039 Hypothyroidism, unspecified: Secondary | ICD-10-CM | POA: Diagnosis present

## 2015-02-11 DIAGNOSIS — T148XXA Other injury of unspecified body region, initial encounter: Secondary | ICD-10-CM

## 2015-02-11 DIAGNOSIS — R609 Edema, unspecified: Secondary | ICD-10-CM | POA: Diagnosis not present

## 2015-02-11 DIAGNOSIS — E860 Dehydration: Secondary | ICD-10-CM | POA: Diagnosis not present

## 2015-02-11 DIAGNOSIS — Z66 Do not resuscitate: Secondary | ICD-10-CM | POA: Diagnosis not present

## 2015-02-11 DIAGNOSIS — G300 Alzheimer's disease with early onset: Secondary | ICD-10-CM | POA: Diagnosis not present

## 2015-02-11 DIAGNOSIS — Z7401 Bed confinement status: Secondary | ICD-10-CM

## 2015-02-11 DIAGNOSIS — L089 Local infection of the skin and subcutaneous tissue, unspecified: Secondary | ICD-10-CM

## 2015-02-11 DIAGNOSIS — E7800 Pure hypercholesterolemia, unspecified: Secondary | ICD-10-CM | POA: Diagnosis not present

## 2015-02-11 DIAGNOSIS — Z9889 Other specified postprocedural states: Secondary | ICD-10-CM

## 2015-02-11 DIAGNOSIS — F419 Anxiety disorder, unspecified: Secondary | ICD-10-CM | POA: Diagnosis not present

## 2015-02-11 DIAGNOSIS — Z79899 Other long term (current) drug therapy: Secondary | ICD-10-CM | POA: Diagnosis not present

## 2015-02-11 DIAGNOSIS — L03115 Cellulitis of right lower limb: Secondary | ICD-10-CM | POA: Diagnosis not present

## 2015-02-11 DIAGNOSIS — Z48817 Encounter for surgical aftercare following surgery on the skin and subcutaneous tissue: Secondary | ICD-10-CM | POA: Diagnosis not present

## 2015-02-11 DIAGNOSIS — F0390 Unspecified dementia without behavioral disturbance: Secondary | ICD-10-CM | POA: Diagnosis present

## 2015-02-11 DIAGNOSIS — M79604 Pain in right leg: Secondary | ICD-10-CM | POA: Diagnosis not present

## 2015-02-11 DIAGNOSIS — N189 Chronic kidney disease, unspecified: Secondary | ICD-10-CM | POA: Diagnosis not present

## 2015-02-11 DIAGNOSIS — M810 Age-related osteoporosis without current pathological fracture: Secondary | ICD-10-CM | POA: Diagnosis not present

## 2015-02-11 DIAGNOSIS — L99 Other disorders of skin and subcutaneous tissue in diseases classified elsewhere: Secondary | ICD-10-CM | POA: Diagnosis not present

## 2015-02-11 DIAGNOSIS — S71101A Unspecified open wound, right thigh, initial encounter: Secondary | ICD-10-CM | POA: Diagnosis not present

## 2015-02-11 DIAGNOSIS — T148 Other injury of unspecified body region: Secondary | ICD-10-CM | POA: Diagnosis not present

## 2015-02-11 DIAGNOSIS — I10 Essential (primary) hypertension: Secondary | ICD-10-CM | POA: Diagnosis present

## 2015-02-11 DIAGNOSIS — E871 Hypo-osmolality and hyponatremia: Secondary | ICD-10-CM | POA: Diagnosis not present

## 2015-02-11 DIAGNOSIS — L0390 Cellulitis, unspecified: Secondary | ICD-10-CM | POA: Diagnosis present

## 2015-02-11 DIAGNOSIS — Z8781 Personal history of (healed) traumatic fracture: Secondary | ICD-10-CM | POA: Diagnosis not present

## 2015-02-11 DIAGNOSIS — G4700 Insomnia, unspecified: Secondary | ICD-10-CM | POA: Diagnosis not present

## 2015-02-11 DIAGNOSIS — E1122 Type 2 diabetes mellitus with diabetic chronic kidney disease: Secondary | ICD-10-CM | POA: Diagnosis not present

## 2015-02-11 DIAGNOSIS — L97821 Non-pressure chronic ulcer of other part of left lower leg limited to breakdown of skin: Secondary | ICD-10-CM | POA: Diagnosis not present

## 2015-02-11 DIAGNOSIS — L89152 Pressure ulcer of sacral region, stage 2: Secondary | ICD-10-CM | POA: Diagnosis not present

## 2015-02-11 DIAGNOSIS — Z9181 History of falling: Secondary | ICD-10-CM | POA: Diagnosis not present

## 2015-02-11 NOTE — ED Notes (Signed)
Pt  From [facility] via EMS with C/O wound on R thigh, reports it may be a spider bite, staff noticed today. Area reddened

## 2015-02-12 ENCOUNTER — Encounter
Admission: EM | Disposition: A | Discharge status: Skilled Nursing Facility | Payer: Self-pay | Source: Home / Self Care | Attending: Internal Medicine

## 2015-02-12 ENCOUNTER — Inpatient Hospital Stay: Payer: PPO | Admitting: Anesthesiology

## 2015-02-12 ENCOUNTER — Emergency Department: Payer: PPO

## 2015-02-12 ENCOUNTER — Encounter: Payer: Self-pay | Admitting: Internal Medicine

## 2015-02-12 DIAGNOSIS — L89152 Pressure ulcer of sacral region, stage 2: Secondary | ICD-10-CM | POA: Diagnosis not present

## 2015-02-12 DIAGNOSIS — N189 Chronic kidney disease, unspecified: Secondary | ICD-10-CM | POA: Diagnosis not present

## 2015-02-12 DIAGNOSIS — Z7401 Bed confinement status: Secondary | ICD-10-CM | POA: Diagnosis not present

## 2015-02-12 DIAGNOSIS — Z9181 History of falling: Secondary | ICD-10-CM | POA: Diagnosis not present

## 2015-02-12 DIAGNOSIS — M79604 Pain in right leg: Secondary | ICD-10-CM | POA: Diagnosis not present

## 2015-02-12 DIAGNOSIS — Z79899 Other long term (current) drug therapy: Secondary | ICD-10-CM | POA: Diagnosis not present

## 2015-02-12 DIAGNOSIS — Z66 Do not resuscitate: Secondary | ICD-10-CM | POA: Diagnosis not present

## 2015-02-12 DIAGNOSIS — L03115 Cellulitis of right lower limb: Secondary | ICD-10-CM | POA: Diagnosis not present

## 2015-02-12 DIAGNOSIS — G4700 Insomnia, unspecified: Secondary | ICD-10-CM | POA: Diagnosis not present

## 2015-02-12 DIAGNOSIS — E871 Hypo-osmolality and hyponatremia: Secondary | ICD-10-CM | POA: Diagnosis not present

## 2015-02-12 DIAGNOSIS — E860 Dehydration: Secondary | ICD-10-CM | POA: Diagnosis not present

## 2015-02-12 DIAGNOSIS — G300 Alzheimer's disease with early onset: Secondary | ICD-10-CM | POA: Diagnosis not present

## 2015-02-12 DIAGNOSIS — L97119 Non-pressure chronic ulcer of right thigh with unspecified severity: Secondary | ICD-10-CM | POA: Diagnosis not present

## 2015-02-12 DIAGNOSIS — F0390 Unspecified dementia without behavioral disturbance: Secondary | ICD-10-CM | POA: Diagnosis not present

## 2015-02-12 DIAGNOSIS — K219 Gastro-esophageal reflux disease without esophagitis: Secondary | ICD-10-CM | POA: Diagnosis not present

## 2015-02-12 DIAGNOSIS — S71101A Unspecified open wound, right thigh, initial encounter: Secondary | ICD-10-CM | POA: Diagnosis not present

## 2015-02-12 DIAGNOSIS — T148 Other injury of unspecified body region: Secondary | ICD-10-CM | POA: Diagnosis not present

## 2015-02-12 DIAGNOSIS — L97821 Non-pressure chronic ulcer of other part of left lower leg limited to breakdown of skin: Secondary | ICD-10-CM | POA: Diagnosis not present

## 2015-02-12 DIAGNOSIS — F419 Anxiety disorder, unspecified: Secondary | ICD-10-CM | POA: Diagnosis not present

## 2015-02-12 DIAGNOSIS — R609 Edema, unspecified: Secondary | ICD-10-CM | POA: Diagnosis not present

## 2015-02-12 DIAGNOSIS — E039 Hypothyroidism, unspecified: Secondary | ICD-10-CM | POA: Diagnosis not present

## 2015-02-12 DIAGNOSIS — L089 Local infection of the skin and subcutaneous tissue, unspecified: Secondary | ICD-10-CM | POA: Diagnosis not present

## 2015-02-12 DIAGNOSIS — L0390 Cellulitis, unspecified: Secondary | ICD-10-CM | POA: Diagnosis present

## 2015-02-12 DIAGNOSIS — Z9889 Other specified postprocedural states: Secondary | ICD-10-CM | POA: Diagnosis not present

## 2015-02-12 DIAGNOSIS — Z8781 Personal history of (healed) traumatic fracture: Secondary | ICD-10-CM | POA: Diagnosis not present

## 2015-02-12 DIAGNOSIS — E119 Type 2 diabetes mellitus without complications: Secondary | ICD-10-CM | POA: Diagnosis not present

## 2015-02-12 DIAGNOSIS — M810 Age-related osteoporosis without current pathological fracture: Secondary | ICD-10-CM | POA: Diagnosis not present

## 2015-02-12 DIAGNOSIS — L99 Other disorders of skin and subcutaneous tissue in diseases classified elsewhere: Secondary | ICD-10-CM | POA: Diagnosis not present

## 2015-02-12 DIAGNOSIS — I10 Essential (primary) hypertension: Secondary | ICD-10-CM | POA: Diagnosis not present

## 2015-02-12 DIAGNOSIS — Z48817 Encounter for surgical aftercare following surgery on the skin and subcutaneous tissue: Secondary | ICD-10-CM | POA: Diagnosis not present

## 2015-02-12 DIAGNOSIS — E1122 Type 2 diabetes mellitus with diabetic chronic kidney disease: Secondary | ICD-10-CM | POA: Diagnosis not present

## 2015-02-12 DIAGNOSIS — E46 Unspecified protein-calorie malnutrition: Secondary | ICD-10-CM | POA: Diagnosis not present

## 2015-02-12 DIAGNOSIS — E7800 Pure hypercholesterolemia, unspecified: Secondary | ICD-10-CM | POA: Diagnosis not present

## 2015-02-12 HISTORY — PX: DEBRIDEMENT AND CLOSURE WOUND: SHX5614

## 2015-02-12 LAB — CBC WITH DIFFERENTIAL/PLATELET
Basophils Absolute: 0.1 10*3/uL (ref 0–0.1)
Basophils Relative: 0 %
EOS ABS: 0.2 10*3/uL (ref 0–0.7)
HCT: 32.7 % — ABNORMAL LOW (ref 35.0–47.0)
Hemoglobin: 10.9 g/dL — ABNORMAL LOW (ref 12.0–16.0)
LYMPHS ABS: 3.4 10*3/uL (ref 1.0–3.6)
Lymphocytes Relative: 13 %
MCH: 31.2 pg (ref 26.0–34.0)
MCHC: 33.3 g/dL (ref 32.0–36.0)
MCV: 93.8 fL (ref 80.0–100.0)
Monocytes Absolute: 3 10*3/uL — ABNORMAL HIGH (ref 0.2–0.9)
Neutro Abs: 18.7 10*3/uL — ABNORMAL HIGH (ref 1.4–6.5)
Neutrophils Relative %: 74 %
PLATELETS: 187 10*3/uL (ref 150–440)
RBC: 3.49 MIL/uL — ABNORMAL LOW (ref 3.80–5.20)
RDW: 13.5 % (ref 11.5–14.5)
WBC: 25.4 10*3/uL — ABNORMAL HIGH (ref 3.6–11.0)

## 2015-02-12 LAB — BLOOD CULTURE ID PANEL (REFLEXED)
Acinetobacter baumannii: NOT DETECTED
CANDIDA ALBICANS: NOT DETECTED
CANDIDA GLABRATA: NOT DETECTED
Candida krusei: NOT DETECTED
Candida parapsilosis: NOT DETECTED
Candida tropicalis: NOT DETECTED
Carbapenem resistance: NOT DETECTED
ENTEROBACTER CLOACAE COMPLEX: NOT DETECTED
ENTEROBACTERIACEAE SPECIES: NOT DETECTED
ENTEROCOCCUS SPECIES: NOT DETECTED
Escherichia coli: NOT DETECTED
HAEMOPHILUS INFLUENZAE: NOT DETECTED
Klebsiella oxytoca: NOT DETECTED
Klebsiella pneumoniae: NOT DETECTED
LISTERIA MONOCYTOGENES: NOT DETECTED
Methicillin resistance: NOT DETECTED
NEISSERIA MENINGITIDIS: NOT DETECTED
PSEUDOMONAS AERUGINOSA: NOT DETECTED
Proteus species: NOT DETECTED
STAPHYLOCOCCUS AUREUS BCID: DETECTED — AB
STAPHYLOCOCCUS SPECIES: DETECTED — AB
STREPTOCOCCUS AGALACTIAE: NOT DETECTED
STREPTOCOCCUS PNEUMONIAE: NOT DETECTED
Serratia marcescens: NOT DETECTED
Streptococcus pyogenes: NOT DETECTED
Streptococcus species: NOT DETECTED
VANCOMYCIN RESISTANCE: NOT DETECTED

## 2015-02-12 LAB — MRSA PCR SCREENING: MRSA by PCR: NEGATIVE

## 2015-02-12 LAB — BASIC METABOLIC PANEL
ANION GAP: 8 (ref 5–15)
Anion gap: 9 (ref 5–15)
BUN: 25 mg/dL — ABNORMAL HIGH (ref 6–20)
BUN: 27 mg/dL — AB (ref 6–20)
CALCIUM: 9.5 mg/dL (ref 8.9–10.3)
CHLORIDE: 93 mmol/L — AB (ref 101–111)
CO2: 21 mmol/L — AB (ref 22–32)
CO2: 23 mmol/L (ref 22–32)
Calcium: 9.3 mg/dL (ref 8.9–10.3)
Chloride: 90 mmol/L — ABNORMAL LOW (ref 101–111)
Creatinine, Ser: 1.05 mg/dL — ABNORMAL HIGH (ref 0.44–1.00)
Creatinine, Ser: 1.2 mg/dL — ABNORMAL HIGH (ref 0.44–1.00)
GFR calc Af Amer: 43 mL/min — ABNORMAL LOW (ref >60)
GFR calc Af Amer: 51 mL/min — ABNORMAL LOW (ref >60)
GFR calc non Af Amer: 44 mL/min — ABNORMAL LOW (ref >60)
GFR, EST NON AFRICAN AMERICAN: 37 mL/min — AB (ref >60)
GLUCOSE: 130 mg/dL — AB (ref 65–99)
GLUCOSE: 202 mg/dL — AB (ref 65–99)
POTASSIUM: 4.6 mmol/L (ref 3.5–5.1)
POTASSIUM: 5.3 mmol/L — AB (ref 3.5–5.1)
Sodium: 120 mmol/L — ABNORMAL LOW (ref 135–145)
Sodium: 124 mmol/L — ABNORMAL LOW (ref 135–145)

## 2015-02-12 LAB — GLUCOSE, CAPILLARY
GLUCOSE-CAPILLARY: 132 mg/dL — AB (ref 65–99)
GLUCOSE-CAPILLARY: 116 mg/dL — AB (ref 65–99)
GLUCOSE-CAPILLARY: 119 mg/dL — AB (ref 65–99)
Glucose-Capillary: 164 mg/dL — ABNORMAL HIGH (ref 65–99)

## 2015-02-12 LAB — CBC
HEMATOCRIT: 32.3 % — AB (ref 35.0–47.0)
HEMOGLOBIN: 10.9 g/dL — AB (ref 12.0–16.0)
MCH: 31.2 pg (ref 26.0–34.0)
MCHC: 33.8 g/dL (ref 32.0–36.0)
MCV: 92.3 fL (ref 80.0–100.0)
Platelets: 180 10*3/uL (ref 150–440)
RBC: 3.5 MIL/uL — ABNORMAL LOW (ref 3.80–5.20)
RDW: 14.1 % (ref 11.5–14.5)
WBC: 18.2 10*3/uL — ABNORMAL HIGH (ref 3.6–11.0)

## 2015-02-12 SURGERY — DEBRIDEMENT, WOUND, WITH CLOSURE
Anesthesia: General | Laterality: Right

## 2015-02-12 MED ORDER — METOCLOPRAMIDE HCL 5 MG/ML IJ SOLN: 5.0000 mg | Freq: Three times a day (TID) | INTRAMUSCULAR | Status: DC | PRN

## 2015-02-12 MED ORDER — PIPERACILLIN-TAZOBACTAM 3.375 G IVPB
3.3750 g | Freq: Three times a day (TID) | INTRAVENOUS | Status: DC
  Administered 2015-02-12 (×2): 3.375 g via INTRAVENOUS
  Filled 2015-02-12 (×5): qty 50

## 2015-02-12 MED ORDER — VANCOMYCIN HCL IN DEXTROSE 1-5 GM/200ML-% IV SOLN
1000.0000 mg | Freq: Once | INTRAVENOUS | Status: AC
  Administered 2015-02-12: 1000 mg via INTRAVENOUS
  Filled 2015-02-12 (×2): qty 200

## 2015-02-12 MED ORDER — ONDANSETRON HCL 4 MG PO TABS: 4.0000 mg | ORAL_TABLET | Freq: Four times a day (QID) | ORAL | Status: DC | PRN

## 2015-02-12 MED ORDER — ONDANSETRON HCL 4 MG PO TABS
4.0000 mg | ORAL_TABLET | Freq: Four times a day (QID) | ORAL | Status: DC | PRN
  Administered 2015-02-13: 4 mg via ORAL
  Filled 2015-02-12: qty 1

## 2015-02-12 MED ORDER — MAGNESIUM HYDROXIDE 400 MG/5ML PO SUSP
30.0000 mL | Freq: Every day | ORAL | Status: DC | PRN
  Administered 2015-02-14: 30 mL via ORAL
  Filled 2015-02-12: qty 30

## 2015-02-12 MED ORDER — ACETAMINOPHEN 650 MG RE SUPP: 650.0000 mg | Freq: Four times a day (QID) | RECTAL | Status: DC | PRN

## 2015-02-12 MED ORDER — BISACODYL 5 MG PO TBEC: 5.0000 mg | DELAYED_RELEASE_TABLET | Freq: Every day | ORAL | Status: DC | PRN

## 2015-02-12 MED ORDER — ACETAMINOPHEN 325 MG PO TABS: 650.0000 mg | ORAL_TABLET | Freq: Four times a day (QID) | ORAL | Status: DC | PRN

## 2015-02-12 MED ORDER — POLYETHYLENE GLYCOL 3350 17 G PO PACK: 17.0000 g | PACK | Freq: Every day | ORAL | Status: DC | PRN

## 2015-02-12 MED ORDER — LACTATED RINGERS IV SOLN
INTRAVENOUS | Status: DC | PRN
Start: 2015-02-12 — End: 2015-02-12
  Administered 2015-02-12: 12:00:00 via INTRAVENOUS

## 2015-02-12 MED ORDER — BISACODYL 10 MG RE SUPP: 10.0000 mg | Freq: Every day | RECTAL | Status: DC | PRN

## 2015-02-12 MED ORDER — ONDANSETRON HCL 4 MG/2ML IJ SOLN: 4.0000 mg | Freq: Four times a day (QID) | INTRAMUSCULAR | Status: DC | PRN

## 2015-02-12 MED ORDER — LEVOTHYROXINE SODIUM 25 MCG PO TABS
137.0000 ug | ORAL_TABLET | Freq: Every day | ORAL | Status: DC
  Administered 2015-02-13: 137 ug via ORAL
  Filled 2015-02-12: qty 1

## 2015-02-12 MED ORDER — FENTANYL CITRATE (PF) 100 MCG/2ML IJ SOLN
25.0000 ug | INTRAMUSCULAR | Status: DC | PRN
  Administered 2015-02-12: 25 ug via INTRAVENOUS

## 2015-02-12 MED ORDER — INSULIN ASPART 100 UNIT/ML ~~LOC~~ SOLN: 0.0000 [IU] | Freq: Every day | SUBCUTANEOUS | Status: DC

## 2015-02-12 MED ORDER — SODIUM CHLORIDE 0.9 % IV BOLUS (SEPSIS)
1000.0000 mL | Freq: Once | INTRAVENOUS | Status: AC
  Administered 2015-02-12: 1000 mL via INTRAVENOUS

## 2015-02-12 MED ORDER — METOCLOPRAMIDE HCL 5 MG PO TABS: 5.0000 mg | ORAL_TABLET | Freq: Three times a day (TID) | ORAL | Status: DC | PRN

## 2015-02-12 MED ORDER — VANCOMYCIN HCL 10 G IV SOLR
1250.0000 mg | INTRAVENOUS | Status: DC
  Filled 2015-02-12: qty 1250

## 2015-02-12 MED ORDER — ACETAMINOPHEN 325 MG PO TABS
650.0000 mg | ORAL_TABLET | Freq: Once | ORAL | Status: AC
  Administered 2015-02-12: 650 mg via ORAL

## 2015-02-12 MED ORDER — DIPHENHYDRAMINE HCL 12.5 MG/5ML PO ELIX: 12.5000 mg | ORAL_SOLUTION | ORAL | Status: DC | PRN

## 2015-02-12 MED ORDER — ENOXAPARIN SODIUM 40 MG/0.4ML ~~LOC~~ SOLN: 40.0000 mg | SUBCUTANEOUS | Status: DC

## 2015-02-12 MED ORDER — SODIUM CHLORIDE 1 G PO TABS
1.0000 g | ORAL_TABLET | Freq: Two times a day (BID) | ORAL | Status: DC
  Administered 2015-02-12 – 2015-02-14 (×4): 1 g via ORAL
  Filled 2015-02-12 (×7): qty 1

## 2015-02-12 MED ORDER — PIPERACILLIN-TAZOBACTAM 3.375 G IVPB
3.3750 g | Freq: Once | INTRAVENOUS | Status: AC
  Administered 2015-02-12: 3.375 g via INTRAVENOUS
  Filled 2015-02-12: qty 50

## 2015-02-12 MED ORDER — ENSURE ENLIVE PO LIQD
237.0000 mL | Freq: Three times a day (TID) | ORAL | Status: DC
  Administered 2015-02-12 – 2015-02-14 (×6): 237 mL via ORAL

## 2015-02-12 MED ORDER — PROPOFOL 10 MG/ML IV BOLUS
INTRAVENOUS | Status: DC | PRN
Start: 2015-02-12 — End: 2015-02-12
  Administered 2015-02-12: 20 mg via INTRAVENOUS
  Administered 2015-02-12: 30 mg via INTRAVENOUS
  Administered 2015-02-12: 50 mg via INTRAVENOUS
  Administered 2015-02-12 (×2): 30 mg via INTRAVENOUS

## 2015-02-12 MED ORDER — ATORVASTATIN CALCIUM 10 MG PO TABS
10.0000 mg | ORAL_TABLET | Freq: Every day | ORAL | Status: DC
  Administered 2015-02-12 – 2015-02-13 (×2): 10 mg via ORAL
  Filled 2015-02-12 (×2): qty 1

## 2015-02-12 MED ORDER — FERROUS SULFATE 325 (65 FE) MG PO TABS
325.0000 mg | ORAL_TABLET | Freq: Two times a day (BID) | ORAL | Status: DC
  Administered 2015-02-12 – 2015-02-14 (×4): 325 mg via ORAL
  Filled 2015-02-12 (×4): qty 1

## 2015-02-12 MED ORDER — FENTANYL CITRATE (PF) 100 MCG/2ML IJ SOLN
INTRAMUSCULAR | Status: DC | PRN
  Administered 2015-02-12 (×2): 50 ug via INTRAVENOUS

## 2015-02-12 MED ORDER — ENOXAPARIN SODIUM 30 MG/0.3ML ~~LOC~~ SOLN
30.0000 mg | SUBCUTANEOUS | Status: DC
  Administered 2015-02-13 – 2015-02-14 (×2): 30 mg via SUBCUTANEOUS
  Filled 2015-02-12 (×2): qty 0.3

## 2015-02-12 MED ORDER — ALPRAZOLAM 0.5 MG PO TABS
0.5000 mg | ORAL_TABLET | Freq: Every day | ORAL | Status: DC
  Administered 2015-02-12 – 2015-02-13 (×2): 0.5 mg via ORAL
  Filled 2015-02-12 (×2): qty 1

## 2015-02-12 MED ORDER — FAMOTIDINE 20 MG PO TABS
20.0000 mg | ORAL_TABLET | Freq: Every day | ORAL | Status: DC
  Administered 2015-02-12 – 2015-02-13 (×2): 20 mg via ORAL
  Filled 2015-02-12 (×2): qty 1

## 2015-02-12 MED ORDER — CEFAZOLIN SODIUM-DEXTROSE 2-3 GM-% IV SOLR
2.0000 g | Freq: Three times a day (TID) | INTRAVENOUS | Status: DC
  Administered 2015-02-12 – 2015-02-14 (×5): 2 g via INTRAVENOUS
  Filled 2015-02-12 (×7): qty 50

## 2015-02-12 MED ORDER — SODIUM CHLORIDE 0.9 % IV SOLN
INTRAVENOUS | Status: DC
  Administered 2015-02-12 – 2015-02-13 (×3): via INTRAVENOUS

## 2015-02-12 MED ORDER — TRAZODONE HCL 50 MG PO TABS
50.0000 mg | ORAL_TABLET | Freq: Every day | ORAL | Status: DC
  Administered 2015-02-12 – 2015-02-13 (×2): 50 mg via ORAL
  Filled 2015-02-12 (×2): qty 1

## 2015-02-12 MED ORDER — FENTANYL CITRATE (PF) 100 MCG/2ML IJ SOLN
INTRAMUSCULAR | Status: AC
  Filled 2015-02-12: qty 2

## 2015-02-12 MED ORDER — ENOXAPARIN SODIUM 30 MG/0.3ML ~~LOC~~ SOLN: 30.0000 mg | SUBCUTANEOUS | Status: DC

## 2015-02-12 MED ORDER — VERAPAMIL HCL ER 180 MG PO TBCR
180.0000 mg | EXTENDED_RELEASE_TABLET | Freq: Every day | ORAL | Status: DC
  Administered 2015-02-12 – 2015-02-14 (×3): 180 mg via ORAL
  Filled 2015-02-12 (×4): qty 1

## 2015-02-12 MED ORDER — INSULIN ASPART 100 UNIT/ML ~~LOC~~ SOLN
0.0000 [IU] | Freq: Three times a day (TID) | SUBCUTANEOUS | Status: DC
  Administered 2015-02-12: 1 [IU] via SUBCUTANEOUS
  Administered 2015-02-13 – 2015-02-14 (×3): 2 [IU] via SUBCUTANEOUS
  Filled 2015-02-12 (×2): qty 2
  Filled 2015-02-12: qty 1
  Filled 2015-02-12: qty 2

## 2015-02-12 MED ORDER — HYDROMORPHONE HCL 1 MG/ML IJ SOLN
0.2500 mg | INTRAMUSCULAR | Status: DC | PRN
Start: 2015-02-12 — End: 2015-02-14

## 2015-02-12 MED ORDER — ONDANSETRON HCL 4 MG/2ML IJ SOLN: 4.0000 mg | Freq: Once | INTRAMUSCULAR | Status: DC | PRN

## 2015-02-12 MED ORDER — FLEET ENEMA 7-19 GM/118ML RE ENEM: 1.0000 | ENEMA | Freq: Once | RECTAL | Status: DC | PRN

## 2015-02-12 MED ORDER — METFORMIN HCL 500 MG PO TABS
500.0000 mg | ORAL_TABLET | Freq: Two times a day (BID) | ORAL | Status: DC
  Administered 2015-02-12 – 2015-02-14 (×4): 500 mg via ORAL
  Filled 2015-02-12 (×4): qty 1

## 2015-02-12 MED ORDER — OXYCODONE HCL 5 MG PO TABS
5.0000 mg | ORAL_TABLET | ORAL | Status: DC | PRN
  Administered 2015-02-12 – 2015-02-13 (×5): 5 mg via ORAL
  Filled 2015-02-12 (×5): qty 1

## 2015-02-12 MED ORDER — ALPRAZOLAM 0.25 MG PO TABS
0.2500 mg | ORAL_TABLET | Freq: Two times a day (BID) | ORAL | Status: DC | PRN
  Administered 2015-02-13 – 2015-02-14 (×2): 0.25 mg via ORAL
  Filled 2015-02-12 (×2): qty 1

## 2015-02-12 MED ORDER — DOCUSATE SODIUM 100 MG PO CAPS
100.0000 mg | ORAL_CAPSULE | Freq: Two times a day (BID) | ORAL | Status: DC
  Administered 2015-02-12 – 2015-02-14 (×5): 100 mg via ORAL
  Filled 2015-02-12 (×5): qty 1

## 2015-02-12 MED ORDER — ACETAMINOPHEN 325 MG PO TABS
ORAL_TABLET | ORAL | Status: AC
  Administered 2015-02-12: 650 mg via ORAL
  Filled 2015-02-12: qty 2

## 2015-02-12 SURGICAL SUPPLY — 34 items
BLADE SURG SZ10 CARB STEEL (BLADE) ×3 IMPLANT
BNDG COHESIVE 4X5 TAN STRL (GAUZE/BANDAGES/DRESSINGS) ×3 IMPLANT
BNDG ESMARK 4X12 TAN STRL LF (GAUZE/BANDAGES/DRESSINGS) ×3 IMPLANT
CANISTER SUCT 1200ML W/VALVE (MISCELLANEOUS) ×3 IMPLANT
CHLORAPREP W/TINT 26ML (MISCELLANEOUS) ×3 IMPLANT
ELECT REM PT RETURN 9FT ADLT (ELECTROSURGICAL) ×3
ELECTRODE REM PT RTRN 9FT ADLT (ELECTROSURGICAL) ×1 IMPLANT
GLOVE BIO SURGEON STRL SZ8 (GLOVE) ×3 IMPLANT
GLOVE INDICATOR 8.0 STRL GRN (GLOVE) ×3 IMPLANT
GLOVE SURG ORTHO 8.5 STRL (GLOVE) ×3 IMPLANT
GOWN STRL REUS W/ TWL LRG LVL3 (GOWN DISPOSABLE) ×1 IMPLANT
GOWN STRL REUS W/ TWL XL LVL3 (GOWN DISPOSABLE) ×1 IMPLANT
GOWN STRL REUS W/TWL LRG LVL3 (GOWN DISPOSABLE) ×3
GOWN STRL REUS W/TWL XL LVL3 (GOWN DISPOSABLE) ×3
LABEL OR SOLS (LABEL) ×3 IMPLANT
NDL SAFETY 18GX1.5 (NEEDLE) ×3 IMPLANT
NS IRRIG 1000ML POUR BTL (IV SOLUTION) ×3 IMPLANT
PACK EXTREMITY ARMC (MISCELLANEOUS) ×3 IMPLANT
PAD CAST CTTN 4X4 STRL (SOFTGOODS) ×1 IMPLANT
PADDING CAST COTTON 4X4 STRL (SOFTGOODS) ×3
SPLINT CAST 1 STEP 3X12 (MISCELLANEOUS) ×3 IMPLANT
SPONGE LAP 18X18 5 PK (GAUZE/BANDAGES/DRESSINGS) ×3 IMPLANT
STAPLER SKIN PROX 35W (STAPLE) ×3 IMPLANT
STOCKINETTE BIAS CUT 4 980044 (GAUZE/BANDAGES/DRESSINGS) ×3 IMPLANT
STOCKINETTE IMPERVIOUS 9X36 MD (GAUZE/BANDAGES/DRESSINGS) ×3 IMPLANT
STRAP SAFETY BODY (MISCELLANEOUS) ×3 IMPLANT
SUT ETHILON 4-0 (SUTURE) ×3
SUT ETHILON 4-0 FS2 18XMFL BLK (SUTURE) ×1
SUT VIC AB 2-0 CT1 36 (SUTURE) ×3 IMPLANT
SUT VIC AB 4-0 SH 27 (SUTURE) ×3
SUT VIC AB 4-0 SH 27XANBCTRL (SUTURE) ×1 IMPLANT
SUT VICRYL+ 3-0 36IN CT-1 (SUTURE) ×3 IMPLANT
SUTURE ETHLN 4-0 FS2 18XMF BLK (SUTURE) ×1 IMPLANT
SYRINGE 10CC LL (SYRINGE) ×3 IMPLANT

## 2015-02-12 NOTE — Progress Notes (Signed)
Pharmacy Antibiotic Note  Gina [name] is a 82 y.o. female admitted on 02/11/2015 with wound infection.  Pharmacy has been consulted for vancomycin and Zosyn dosing.  Plan: TBW 70.4kg  IBW 54.7 DW 70.4kg  Vd 49L kei 0.027 hr-1  t1/2 26 hours Vancomycin 1250 mg q 36 hours ordered. Level before 4th dose, not at Css. Goal trough 15-20 mcg/mL.  Zosyn 3.375 grams q 8 hours ordered.  Height:  (162.6 cm) Weight: 155 lb 3.2 oz (70.398 kg) IBW/kg (Calculated) : 54.7  Temp (24hrs), Avg:98.4 F (36.9 C), Min:97.6 F (36.4 C), Max:99.1 F (37.3 C)   Recent Labs Lab 02/12/15 0012  WBC 25.4*  CREATININE 1.20*    Estimated Creatinine Clearance: 27 mL/min (by C-G formula based on Cr of 1.2).    Allergies  Allergen Reactions  . Fosamax [Alendronate Sodium] Other (See Comments)    Reaction: Unknown    Antimicrobials this admission:   Dose adjustments this admission:   Microbiology results:  BCx: pending  UCx:    Sputum:    MRSA PCR:   Thank you for allowing pharmacy to be a part of this patient's care.  Kevona Lupinacci S 02/12/2015 5:18 AM

## 2015-02-12 NOTE — Anesthesia Preprocedure Evaluation (Signed)
Anesthesia Evaluation  Patient identified by MRN, date of birth, ID band Patient awake    Reviewed: Allergy & Precautions, H&P , NPO status , Patient's Chart, lab work & pertinent test results, reviewed documented beta blocker date and time   History of Anesthesia Complications Negative for: history of anesthetic complications  Airway Mallampati: IV  TM Distance: >3 FB Neck ROM: full    Dental no notable dental hx. (+) Edentulous Upper, Edentulous Lower   Pulmonary neg pulmonary ROS,    Pulmonary exam normal breath sounds clear to auscultation       Cardiovascular Exercise Tolerance: Good hypertension, (-) angina(-) CAD, (-) Past MI, (-) Cardiac Stents and (-) CABG Normal cardiovascular exam(-) dysrhythmias (-) Valvular Problems/Murmurs Rhythm:regular Rate:Normal     Neuro/Psych Seizures - (from withdrawal of xanax),  PSYCHIATRIC DISORDERS (dementia)    GI/Hepatic Neg liver ROS, GERD  Medicated and Controlled,  Endo/Other  diabetes, Well Controlled, Oral Hypoglycemic AgentsHypothyroidism   Renal/GU negative Renal ROS  negative genitourinary   Musculoskeletal   Abdominal   Peds  Hematology negative hematology ROS (+)   Anesthesia Other Findings Past Medical History:   Diabetes mellitus without complication (HCC)                 Hypertension                                                 Dementia                                                     Anxiety                                                      GERD (gastroesophageal reflux disease)                       Osteoporosis                                                 Hypothyroidism                                               Reproductive/Obstetrics negative OB ROS                             Anesthesia Physical  Anesthesia Plan  ASA: III  Anesthesia Plan: General   Post-op Pain Management:    Induction:   Airway  Management Planned:   Additional Equipment:   Intra-op Plan:   Post-operative Plan:   Informed Consent: I have reviewed the patients History and Physical, chart, labs and discussed the procedure including the risks, benefits and alternatives for the proposed anesthesia with the patient or authorized  representative who has indicated his/her understanding and acceptance.   Dental Advisory Given  Plan Discussed with: Anesthesiologist, CRNA and Surgeon  Anesthesia Plan Comments:         Anesthesia Quick Evaluation

## 2015-02-12 NOTE — Progress Notes (Signed)
Pharmacy Antibiotic Note  Gina [name] is a 82 y.o. female admitted on 02/11/2015 with cellulitis.  Pharmacy has been consulted for Vancomycin and Zosyn dosing.  Plan: BCID results were discussed with Dr. Imogene Burn and the patient's antibiotics will be changed to Cefazolin 2g IV q8h. Further narrowing will be determined based on finalized susceptibilities.  Height:  (162.6 cm) Weight: 155 lb 3.2 oz (70.398 kg) IBW/kg (Calculated) : 54.7  Temp (24hrs), Avg:98.4 F (36.9 C), Min:97.6 F (36.4 C), Max:99.1 F (37.3 C)   Recent Labs Lab 02/12/15 0012 02/12/15 0746  WBC 25.4* 18.2*  CREATININE 1.20* 1.05*    Estimated Creatinine Clearance: 30.9 mL/min (by C-G formula based on Cr of 1.05).    Allergies  Allergen Reactions  . Fosamax [Alendronate Sodium] Other (See Comments)    Reaction: Unknown    Antimicrobials this admission: Vancomycin 2/11 >> 2/11 Zosyn 2/11 >> 2/11 Cefazolin 2/11 >>  Dose adjustments this admission:  Microbiology results: 2/1 BCx: Staph aureus-not MEC A in 1 set  Thank you for allowing pharmacy to be a part of this patient's care.  Clovia Cuff, PharmD, BCPS 02/12/2015 5:58 PM

## 2015-02-12 NOTE — Addendum Note (Signed)
Addendum  created 02/12/15 1542 by Maegan Buller, MD   Modules edited: Charges VN    

## 2015-02-12 NOTE — Anesthesia Postprocedure Evaluation (Signed)
Anesthesia Post Note  Patient: Gina [name]  Procedure(s) Performed: Procedure(s) (LRB): DEBRIDEMENT AND CLOSURE WOUND (Right)  Patient location during evaluation: PACU Anesthesia Type: General Level of consciousness: awake and alert Pain management: pain level controlled Vital Signs Assessment: post-procedure vital signs reviewed and stable Respiratory status: spontaneous breathing, nonlabored ventilation, respiratory function stable and patient connected to nasal cannula oxygen Cardiovascular status: blood pressure returned to baseline and stable Postop Assessment: no signs of nausea or vomiting Anesthetic complications: no    Last Vitals:  Filed Vitals:   02/12/15 1342 02/12/15 1412  BP: 131/62 170/76  Pulse: 89 91  Temp: 37.2 C 36.7 C  Resp: 18 16    Last Pain:  Filed Vitals:   02/12/15 1413  PainSc: 0-No pain                 Lenard Simmer

## 2015-02-12 NOTE — Progress Notes (Signed)
OR tech here to take patient to surgery. Consents on chart -obtained from daughter over the phone.

## 2015-02-12 NOTE — Progress Notes (Signed)
Dr.Poggi here to see patient and to assess the right hip wound.

## 2015-02-12 NOTE — Progress Notes (Addendum)
Initial Nutrition Assessment   INTERVENTION:   Meals and Snacks: Cater to patient preferences Medical Food Supplement Therapy: will recommend Ensure Enlive po TID, each supplement provides 350 kcal and 20 grams of protein, and will change to Glucerna pending blood glucose  NUTRITION DIAGNOSIS:   Increased nutrient needs related to wound healing as evidenced by estimated needs.  GOAL:   Patient will meet greater than or equal to 90% of their needs  MONITOR:    (Energy Intake, Electrolyte and renal Profile, Anthropometrics, Digestive System, Skin)  REASON FOR ASSESSMENT:   Malnutrition Screening Tool    ASSESSMENT:   Pt admitted with wound on hip, scheduled for debridement 2/11. Pt with h/o right hip fracture with surgical intervention October 2016  Past Medical History  Diagnosis Date  . Diabetes mellitus without complication (HCC)   . Hypertension   . Dementia   . Anxiety   . GERD (gastroesophageal reflux disease)   . Osteoporosis   . Hypothyroidism      Diet Order:  Diet heart healthy/carb modified Room service appropriate?: Yes; Fluid consistency:: Thin    Current Nutrition: Pt currently NPO.   Food/Nutrition-Related History: Pt out of room for surgical intervention, per [facility] decreased appetite PTA.   Scheduled Medications:  . ALPRAZolam  0.5 mg Oral QHS  . atorvastatin  10 mg Oral q1800  . docusate sodium  100 mg Oral BID  . [START ON 02/13/2015] enoxaparin (LOVENOX) injection  30 mg Subcutaneous Q24H  . famotidine  20 mg Oral QHS  . fentaNYL      . ferrous sulfate  325 mg Oral BID WC  . insulin aspart  0-5 Units Subcutaneous QHS  . insulin aspart  0-9 Units Subcutaneous TID WC  . levothyroxine  137 mcg Oral QAC breakfast  . metFORMIN  500 mg Oral BID WC  . piperacillin-tazobactam (ZOSYN)  IV  3.375 g Intravenous 3 times per day  . sodium chloride  1 g Oral BID WC  . traZODone  50 mg Oral QHS  . vancomycin  1,250 mg Intravenous Q36H  . verapamil  180  mg Oral Daily    Continuous Medications:  . sodium chloride 75 mL/hr at 02/12/15 0404     Electrolyte/Renal Profile and Glucose Profile:   Recent Labs Lab 02/12/15 0012 02/12/15 0746  NA 120* 124*  K 5.3* 4.6  CL 90* 93*  CO2 21* 23  BUN 27* 25*  CREATININE 1.20* 1.05*  CALCIUM 9.5 9.3  GLUCOSE 202* 130*   Protein Profile: No results for input(s): ALBUMIN in the last 168 hours.  Gastrointestinal Profile: Last BM:  unknown   Nutrition-Focused Physical Exam Findings:  Unable to complete Nutrition-Focused physical exam at this time.   RD notes pt has been bedbound since right hip fracture October 2016.   Weight Change: Per [facility] weight encounters pt with 6% weight loss in 3 months.   Skin:   unstageable buttock and thigh ulcers   Height:   Ht Readings from Last 1 Encounters:  02/12/15  (1.626 m)    Weight:   Wt Readings from Last 1 Encounters:  02/12/15 155 lb 3.2 oz (70.398 kg)   Wt Readings from Last 10 Encounters:  02/12/15 155 lb 3.2 oz (70.398 kg)  11/06/14 165 lb 5.5 oz (75 kg)  10/20/14 159 lb (72.122 kg)  09/30/14 163 lb (73.936 kg)    BMI:  Body mass index is 26.63 kg/(m^2).  Estimated Nutritional Needs:   Kcal:  BEE: 1080kcals, TEE: (  IF 1.1-1.3)(AF 1.2) 1425-1684kcals  Protein:  77-91g protein (1.1-1.3g/kg)  Fluid:  1750-2125mL of fluid (25-8mL/kg)  EDUCATION NEEDS:   No education needs identified at this time  HIGH Care Level  [name], RD, LDN Pager [phone] Weekend/On-Call Pager [phone]

## 2015-02-12 NOTE — Progress Notes (Deleted)
Security at the bedside

## 2015-02-12 NOTE — Progress Notes (Signed)
Patient received from PACU, right thigh dressing in place, scd on left leg, v/s assessed, IV site intact -right ac.  Assessed for pain- voiced no pain.

## 2015-02-12 NOTE — Transfer of Care (Signed)
Immediate Anesthesia Transfer of Care Note  Patient: Gina [name]  Procedure(s) Performed: Procedure(s): DEBRIDEMENT AND CLOSURE WOUND (Right)  Patient Location: PACU  Anesthesia Type:General  Level of Consciousness: awake and alert   Airway & Oxygen Therapy: Patient Spontanous Breathing and Patient connected to nasal cannula oxygen  Post-op Assessment: Report given to RN and Post -op Vital signs reviewed and stable  Post vital signs: Reviewed and stable  Last Vitals:  Filed Vitals:   02/12/15 0832 02/12/15 1224  BP: 134/54 119/66  Pulse: 75 89  Temp: 36.8 C 36.8 C  Resp: 16 14    Complications: No apparent anesthesia complications

## 2015-02-12 NOTE — Progress Notes (Addendum)
ORTHOPAEDIC CONSULTATION  REQUESTING PHYSICIAN: Milagros Loll, MD  Chief Complaint:   Right thigh ulceration.  History of Present Illness: Gina [name] is a 82 y.o. female resident of a nursing home with multiple medical problems, including diabetes, hypertension, dementia, gastroesophageal reflux disease, and osteoporosis. The patient sustained an intertrochanteric fracture of her right hip which was fixed with a short trochanteric femoral nail in October, 2016 by Dr. Hyacinth Meeker. The patient has remained nonambulatory since the surgery, primarily due to pain. The patient was noted have an area of ulceration with necrosis over the anterolateral thigh yesterday. She was brought to the emergency room where she was noted have a white count of 25,000, but was afebrile. She was admitted for more definitive treatment of this ulceration.  Past Medical History  Diagnosis Date  . Diabetes mellitus without complication (HCC)   . Hypertension   . Dementia   . Anxiety   . GERD (gastroesophageal reflux disease)   . Osteoporosis   . Hypothyroidism    Past Surgical History  Procedure Laterality Date  . Intramedullary (im) nail intertrochanteric Right 10/21/2014    Procedure: INTRAMEDULLARY (IM) NAIL INTERTROCHANTRIC;  Surgeon: Deeann Saint, MD;  Location: [facility] ORS;  Service: Orthopedics;  Laterality: Right;   Social History   Social History  . Marital Status: Married    Spouse Name: N/A  . Number of Children: N/A  . Years of Education: N/A   Social History Main Topics  . Smoking status: Never Smoker   . Smokeless tobacco: Never Used  . Alcohol Use: No  . Drug Use: No  . Sexual Activity: No   Other Topics Concern  . None   Social History Narrative   Family History  Problem Relation Age of Onset  . Family history unknown: Yes   Allergies  Allergen Reactions  . Fosamax [Alendronate Sodium] Other (See Comments)     Reaction: Unknown   Prior to Admission medications   Medication Sig Start Date End Date Taking? Authorizing Provider  ALPRAZolam (XANAX) 0.25 MG tablet Take 1 tablet (0.25 mg total) by mouth 2 (two) times daily as needed for anxiety. 10/22/14  Yes Altamese Dilling, MD  ALPRAZolam Prudy Feeler) 0.5 MG tablet Take 1 tablet (0.5 mg total) by mouth at bedtime. 10/22/14  Yes Altamese Dilling, MD  atorvastatin (LIPITOR) 10 MG tablet Take 10 mg by mouth daily at 6 PM.    Yes Historical Provider, MD  calcium-vitamin D (OSCAL WITH D) 500-200 MG-UNIT tablet Take 1 tablet by mouth 2 (two) times daily. 10/22/14  Yes Altamese Dilling, MD  Cholecalciferol (VITAMIN D3) 50000 units CAPS Take 1 capsule by mouth once a week. Takes on Saturday   Yes Historical Provider, MD  ferrous sulfate 325 (65 FE) MG tablet Take 1 tablet (325 mg total) by mouth 2 (two) times daily with a meal. 10/22/14  Yes Altamese Dilling, MD  HYDROcodone-acetaminophen (NORCO/VICODIN) 5-325 MG tablet Take 1-2 tablets by mouth every 6 (six) hours as needed for moderate pain. Patient taking differently: Take 1 tablet by mouth every 12 (twelve) hours.  10/22/14  Yes Altamese Dilling, MD  levothyroxine (SYNTHROID, LEVOTHROID) 137 MCG tablet Take 137 mcg by mouth daily before breakfast.   Yes Historical Provider, MD  loperamide (IMODIUM) 2 MG capsule Take 2 mg by mouth every 4 (four) hours as needed for diarrhea or loose stools.    Yes Historical Provider, MD  Menthol-Zinc Oxide (REMEDY CALAZIME) 0.2-20 % PSTE Apply 1 application topically as needed (skin irritation). Apply to excoriated  areas on sacrum   Yes Historical Provider, MD  metFORMIN (GLUCOPHAGE) 500 MG tablet Take 500 mg by mouth 2 (two) times daily with a meal.   Yes Historical Provider, MD  nystatin cream (MYCOSTATIN) Apply 1 application topically as needed (rash). Apply to groin   Yes Historical Provider, MD  omeprazole (PRILOSEC) 20 MG capsule Take 20 mg by mouth  daily.   Yes Historical Provider, MD  ranitidine (ZANTAC) 150 MG tablet Take 150 mg by mouth at bedtime.   Yes Historical Provider, MD  senna (SENOKOT) 8.6 MG TABS tablet Take 1 tablet (8.6 mg total) by mouth daily as needed for mild constipation. 10/22/14  Yes Altamese Dilling, MD  sodium chloride (OCEAN) 0.65 % SOLN nasal spray Place 2 sprays into both nostrils every 6 (six) hours as needed for congestion.    Yes Historical Provider, MD  sodium chloride 1 G tablet Take 1 g by mouth 2 (two) times daily with a meal.   Yes Historical Provider, MD  traZODone (DESYREL) 50 MG tablet Take 50 mg by mouth at bedtime.   Yes Historical Provider, MD  verapamil (VERELAN PM) 240 MG 24 hr capsule Take 240 mg by mouth daily.    Yes Historical Provider, MD  vitamin C (ASCORBIC ACID) 500 MG tablet Take 500 mg by mouth daily.   Yes Historical Provider, MD  cefUROXime (CEFTIN) 250 MG tablet Take 1 tablet (250 mg total) by mouth 2 (two) times daily with a meal. For 2 days Patient not taking: Reported on 02/12/2015 10/22/14   Altamese Dilling, MD   Dg Femur, Min 2 Views Right  02/12/2015  CLINICAL DATA:  Acute onset of right thigh wound, thought to reflect a spider bite. Initial encounter. EXAM: RIGHT FEMUR 2 VIEWS COMPARISON:  Right hip radiographs performed 11/06/2014 FINDINGS: There is lucency about the proximal aspect of the patient's right femoral stem, measuring up to 4 mm, concerning for mild loosening. The dynamic hip screw appears to have migrated centrally since the prior study, extending through the right femoral head and impacting on the acetabulum. This likely causes markedly limited range of motion. The previously noted small fracture line through the residual greater femoral trochanter demonstrates mild interval healing. Mild degenerative change is noted at the right knee, with medial chronic narrowing and marginal osteophyte formation. The known soft tissue wound is not well characterized on  radiograph. No radiopaque foreign bodies are seen. IMPRESSION: 1. Known soft tissue wound is not well characterized on radiograph. No radiopaque foreign bodies seen. 2. Dynamic hip screw appears to have migrated centrally since the prior study, now extending through the right femoral head and impacting on the acetabulum. This likely causes markedly limited range of motion. 3. Lucency about the proximal aspect of the right femoral stem, measuring up to 4 mm, concerning for mild loosening. This appears to be new from the prior study. These results were called by telephone at the time of interpretation on 02/12/2015 at 12:50 am to Dr. Chiquita Loth, who verbally acknowledged these results. Electronically Signed   By: Roanna Raider M.D.   On: 02/12/2015 00:53    Positive ROS: All other systems have been reviewed and were otherwise negative with the exception of those mentioned in the HPI and as above.  Physical Exam: General:  Alert, no acute distress Psychiatric:  Patient is not competent for consent, but does have normal mood and affect   Cardiovascular:  No pedal edema Respiratory:  No wheezing, non-labored breathing GI:  Abdomen  is soft and non-tender Skin:  No lesions in the area of chief complaint Neurologic:  Sensation intact distally Lymphatic:  No axillary or cervical lymphadenopathy  Orthopedic Exam:  Orthopedic examination is limited to the right hip and lower extremity. The proximal and distal incisions appear to be well healed and without evidence for infection. Just below the more distal incision site, there is an area of ulceration measuring approximately 2.5-3 cm in diameter with purulent material able to be expressed from it. Its appearance is not classic for a deep wound infection, but this cannot be ruled out. Rather, it appears to be an area of necrotic tissue secondary to an insect or spider bite. There is surrounding erythema extending for 1-2 cm in all directions around the area of  necrotic tissue. The patient is able to move her leg, although she does have some pain in her hip with range of motion, as expected, given the x-ray findings. She is neurovascular intact to the right lower extremity and foot.  X-rays:  X-rays of the pelvis and right hip and femur are available for review. The findings are as described above. The intratrochanteric hip fracture appears to show evidence of interval healing with some compression at the fracture site. The primary concern is protrusion of the lag screw into the acetabulum. No new fractures are identified.   Assessment: Necrotic ulceration right lateral thigh of unclear origin.  Plan: Although the ulceration is near her recent hip surgical site, its appearance does not appear to be classic for a deep wound infection. Rather, it may be secondary to a spider or insect bite of some sort. Regardless, I do feel that it should be debrided as it does appear to be an area of necrotic tissue with surrounding cellulitis. I have discussed this procedure with the patient's daughter, her power of attorney, and she is in agreement. I would like to do this this morning as the patient has remained nothing by mouth. The procedure has been discussed in detail, as have the potential risks (including bleeding, infection, nerve and/or blood vessel injury, persistent or recurrent pain or infection, need for further surgery surgery, blood clots, strokes, heart attacks and/or arrhythmias, etc.) and benefits. The patient's daughter states her understanding and agrees for [place] to proceed. A formal written consent will be obtained by the nursing staff.  Regarding the actual hardware penetration into the acetabulum, this would require formal hardware removal with possible revision procedure if the fracture has not fully healed. The patient's daughter does not wish to consider this more extensive surgery for her mother at this time, unless it is absolutely essential. Given  the patient's age and limited mobility, I feel that this is a reasonable request.  Thank you for ask me to participate in the care of this most unfortunate woman. I will be happy to follow her with you.   Maryagnes Amos, MD  Beeper #:  [phone]  02/12/2015 8:44 AM

## 2015-02-12 NOTE — ED Notes (Signed)
Patient transported to CT 

## 2015-02-12 NOTE — ED Provider Notes (Signed)
[facility] Emergency Department Provider Note  ____________________________________________  Time seen: Approximately 12:10 AM  I have reviewed the triage vital signs and the nursing notes.   HISTORY  Chief Complaint Wound Infection    HPI Gina [name] is a 82 y.o. female [place] a [place] wound on right thigh. Nursing staff noted the area tonight and sent patient to the ED for evaluation. Daughter states staff told her it may be a "spider bite". Daughter states patient suffered a right hip fracture last November s/p surgery and has been nonambulatory since. Patient denies pain to the affected area and did not notice the wound. Denies recent fever, chills, chest pain, shortness of breath, abdominal pain, nausea, vomiting, diarrhea. Denies recent trauma.   Past Medical History  Diagnosis Date  . Diabetes mellitus without complication (HCC)   . Hypertension   . Dementia   . Anxiety   . GERD (gastroesophageal reflux disease)   . Osteoporosis   . Hypothyroidism     Patient Active Problem List   Diagnosis Date Noted  . Hip fracture (HCC) 10/20/2014  . Pressure ulcer 10/20/2014  . HYPOTHYROIDISM 04/10/2006  . DIABETES MELLITUS, WITH GASTROPARESIS 04/10/2006  . HYPERCHOLESTEROLEMIA 04/10/2006  . DEMENTIA 04/10/2006  . ANXIETY 04/10/2006  . Essential hypertension 04/10/2006  . ATHEROSCLEROTIC CARDIOVASCULAR DISEASE 04/10/2006  . GERD 04/10/2006  . CONSTIPATION 04/10/2006  . DIABETES MELLITUS, TYPE II 05/09/2005    Past Surgical History  Procedure Laterality Date  . Intramedullary (im) nail intertrochanteric Right 10/21/2014    Procedure: INTRAMEDULLARY (IM) NAIL INTERTROCHANTRIC;  Surgeon: Deeann Saint, MD;  Location: [facility] ORS;  Service: Orthopedics;  Laterality: Right;    Current Outpatient Rx  Name  Route  Sig  Dispense  Refill  . ALPRAZolam (XANAX) 0.25 MG tablet   Oral   Take 1  tablet (0.25 mg total) by mouth 2 (two) times daily as needed for anxiety.   30 tablet   0   . ALPRAZolam (XANAX) 0.5 MG tablet   Oral   Take 1 tablet (0.5 mg total) by mouth at bedtime.   10 tablet   0   . atorvastatin (LIPITOR) 10 MG tablet   Oral   Take 10 mg by mouth daily.         . calcium-vitamin D (OSCAL WITH D) 500-200 MG-UNIT tablet   Oral   Take 1 tablet by mouth 2 (two) times daily.   60 tablet   0   . cefUROXime (CEFTIN) 250 MG tablet   Oral   Take 1 tablet (250 mg total) by mouth 2 (two) times daily with a meal. For 2 days   4 tablet   0   . ferrous sulfate 325 (65 FE) MG tablet   Oral   Take 1 tablet (325 mg total) by mouth 2 (two) times daily with a meal.   60 tablet   3   . HYDROcodone-acetaminophen (NORCO/VICODIN) 5-325 MG tablet   Oral   Take 1-2 tablets by mouth every 6 (six) hours as needed for moderate pain.   30 tablet   0   . levothyroxine (SYNTHROID, LEVOTHROID) 137 MCG tablet   Oral   Take 137 mcg by mouth daily before breakfast.         . loperamide (IMODIUM) 2 MG capsule   Oral   Take 2 mg by mouth as needed for diarrhea or loose stools.         Marland Kitchen  metFORMIN (GLUCOPHAGE) 500 MG tablet   Oral   Take 500 mg by mouth 2 (two) times daily with a meal.         . neomycin-bacitracin-polymyxin (NEOSPORIN) ointment   Topical   Apply 1 application topically as needed for wound care. apply to eye         . nystatin-triamcinolone (MYCOLOG II) cream   Topical   Apply 1 application topically as needed.         Marland Kitchen omeprazole (PRILOSEC) 20 MG capsule   Oral   Take 20 mg by mouth daily.         Marland Kitchen senna (SENOKOT) 8.6 MG TABS tablet   Oral   Take 1 tablet (8.6 mg total) by mouth daily as needed for mild constipation.   30 each   0   . sodium chloride (OCEAN) 0.65 % SOLN nasal spray   Each Nare   Place 2 sprays into both nostrils 4 (four) times daily as needed for congestion.         . sodium chloride 1 G tablet   Oral    Take 1 g by mouth 2 (two) times daily with a meal.         . traZODone (DESYREL) 50 MG tablet   Oral   Take 50 mg by mouth at bedtime.         . verapamil (VERELAN PM) 240 MG 24 hr capsule   Oral   Take 240 mg by mouth daily at 12 noon.         . vitamin C (ASCORBIC ACID) 500 MG tablet   Oral   Take 500 mg by mouth daily.           Allergies Fosamax  No family history on file.  Social History Social History  Substance Use Topics  . Smoking status: Never Smoker   . Smokeless tobacco: Never Used  . Alcohol Use: No    Review of Systems Constitutional: No fever/chills Eyes: No visual changes. ENT: No sore throat. Cardiovascular: Denies chest pain. Respiratory: Denies shortness of breath. Gastrointestinal: No abdominal pain.  No nausea, no vomiting.  No diarrhea.  No constipation. Genitourinary: Negative for dysuria. Musculoskeletal: Positive for right thigh wound. Negative for back pain. Skin: Negative for rash. Neurological: Negative for headaches, focal weakness or numbness.  10-point ROS otherwise negative.  ____________________________________________   PHYSICAL EXAM:  VITAL SIGNS: ED Triage Vitals  Enc Vitals Group     BP 02/11/15 2322 147/55 mmHg     Pulse Rate 02/11/15 2323 67     Resp 02/11/15 2322 12     Temp 02/11/15 2319 99.1 F (37.3 C)     Temp src --      SpO2 02/11/15 2323 93 %     Weight --      Height 02/11/15 2319 5\' 3"  (1.6 m)     Head Cir --      Peak Flow --      Pain Score 02/11/15 2320 0     Pain Loc --      Pain Edu? --      Excl. in GC? --     Constitutional: Alert and oriented. Well appearing and in no acute distress. Eyes: Conjunctivae are normal. PERRL. EOMI. Head: Atraumatic. Nose: No congestion/rhinnorhea. Mouth/Throat: Mucous membranes are moist.  Oropharynx non-erythematous. Neck: No stridor.   Cardiovascular: Normal rate, regular rhythm. Grossly normal heart sounds.  Good peripheral  circulation. Respiratory: Normal respiratory effort.  No retractions. Lungs CTAB. Gastrointestinal: Soft and nontender. No distention. No abdominal bruits. No CVA tenderness. Musculoskeletal: There is an approximately 3 cm in diameter area of necrotic wound with surrounding warmth and erythema to the lateral aspect of the right thigh. It appears as though patient had either a blister or superficial ulcer which burst. There is no crepitance and no fluctuance.  No joint effusions. Neurologic:  Normal speech and language. No gross focal neurologic deficits are appreciated.  Skin:  Skin is warm, dry and intact. No rash noted. Psychiatric: Mood and affect are normal. Speech and behavior are normal.  ____________________________________________   LABS (all labs ordered are listed, but only abnormal results are displayed)  Labs Reviewed  CBC WITH DIFFERENTIAL/PLATELET - Abnormal; Notable for the following:    WBC 25.4 (*)    RBC 3.49 (*)    Hemoglobin 10.9 (*)    HCT 32.7 (*)    Neutro Abs 18.7 (*)    Monocytes Absolute 3.0 (*)    All other components within normal limits  BASIC METABOLIC PANEL - Abnormal; Notable for the following:    Sodium 120 (*)    Potassium 5.3 (*)    Chloride 90 (*)    CO2 21 (*)    Glucose, Bld 202 (*)    BUN 27 (*)    Creatinine, Ser 1.20 (*)    GFR calc non Af Amer 37 (*)    GFR calc Af Amer 43 (*)    All other components within normal limits  CULTURE, BLOOD (ROUTINE X 2)  CULTURE, BLOOD (ROUTINE X 2)  WOUND CULTURE   ____________________________________________  EKG  ED ECG REPORT I, Pearline Yerby J, the attending physician, personally viewed and interpreted this ECG.   Date: 02/12/2015  EKG Time: 2325  Rate: 58  Rhythm: sinus bradycardia  Axis: LAD  Intervals:none  ST&T Change: Nonspecific  ____________________________________________  RADIOLOGY  Right femur x-rays (viewed by me, and discussed with Dr. Cherly Hensen): 1. Known soft tissue wound is  not well characterized on radiograph. No radiopaque foreign bodies seen. 2. Dynamic hip screw appears to have migrated centrally since the prior study, now extending through the right femoral head and impacting on the acetabulum. This likely causes markedly limited range of motion. 3. Lucency about the proximal aspect of the right femoral stem, measuring up to 4 mm, concerning for mild loosening. This appears to be new from the prior study. ____________________________________________   PROCEDURES  Procedure(s) performed: None  Critical Care performed: No  ____________________________________________   INITIAL IMPRESSION / ASSESSMENT AND PLAN / ED COURSE  Pertinent labs & imaging results that were available during my care of the patient were reviewed by me and considered in my medical decision making (see chart for details).  82 year old female sent by the nursing facility for right thigh wound. Wound does not appear to be an insect bite; instead it appears to have developed over a period of time into a necrotic shallow ulcer with surrounding cellulitis. Will obtain screening lab work including blood cultures, obtain x-ray of affected area to evaluate for osteomyelitis. Anticipate need for IV antibiotics and admission.  ----------------------------------------- 2:08 AM on 02/12/2015 -----------------------------------------  Updated patient and daughter of laboratory and imaging results which are notable for leukocytosis and hyponatremia. Will start IV antibiotics and discuss with hospitalist for admission. ____________________________________________   FINAL CLINICAL IMPRESSION(S) / ED DIAGNOSES  Final diagnoses:  Wound infection (HCC)  Essential hypertension  Type 2 diabetes mellitus without complication, without long-term current use  of insulin (HCC)      Irean Hong, MD 02/12/15 [phone]

## 2015-02-12 NOTE — Progress Notes (Signed)
[facility] Phys[facility] - [place] at [facility]   PATIENT NAME: Gina [name]    MR#:  [id number]  DATE OF BIRTH:  March 23, 1917  SUBJECTIVE:  CHIEF COMPLAINT:   Chief Complaint  Patient presents with  . Wound Infection   The patient is a demented, status post the right hip ulcer. I&D just now REVIEW OF SYSTEMS:  Unable to get ROS.  DRUG ALLERGIES:   Allergies  Allergen Reactions  . Fosamax [Alendronate Sodium] Other (See Comments)    Reaction: Unknown    VITALS:  Blood pressure 170/76, pulse 91, temperature 98.1 F (36.7 C), temperature source Oral, resp. rate 16, height  (1.626 m), weight 70.398 kg (155 lb 3.2 oz), SpO2 100 %.  PHYSICAL EXAMINATION:  GENERAL:  82 y.o.-year-old patient lying in the bed with no acute distress.  EYES: Pupils equal, round, reactive to light and accommodation. No scleral icterus. Extraocular muscles intact.  HEENT: Head atraumatic, normocephalic. Oropharynx and nasopharynx clear.  NECK:  Supple, no jugular venous distention. No thyroid enlargement, no tenderness.  LUNGS: Normal breath sounds bilaterally, no wheezing, rales,rhonchi or crepitation. No use of accessory muscles of respiration.  CARDIOVASCULAR: S1, S2 normal. No murmurs, rubs, or gallops.  ABDOMEN: Soft, nontender, nondistended. Bowel sounds present. No organomegaly or mass.  EXTREMITIES: No pedal edema, cyanosis, or clubbing. Right thigh in dressing. NEUROLOGIC: Unable to exam. PSYCHIATRIC: The patient is demented. SKIN: No obvious rash, lesion, or ulcer.    LABORATORY PANEL:   CBC  Recent Labs Lab 02/12/15 0746  WBC 18.2*  HGB 10.9*  HCT 32.3*  PLT 180   ------------------------------------------------------------------------------------------------------------------  Chemistries   Recent Labs Lab 02/12/15 0746  NA 124*  K 4.6  CL 93*  CO2 23  GLUCOSE 130*  BUN 25*  CREATININE 1.05*  CALCIUM 9.3    ------------------------------------------------------------------------------------------------------------------  Cardiac Enzymes No results for input(s): TROPONINI in the last 168 hours. ------------------------------------------------------------------------------------------------------------------  RADIOLOGY:  Dg Femur, Min 2 Views Right  02/12/2015  CLINICAL DATA:  Acute onset of right thigh wound, thought to reflect a spider bite. Initial encounter. EXAM: RIGHT FEMUR 2 VIEWS COMPARISON:  Right hip radiographs performed 11/06/2014 FINDINGS: There is lucency about the proximal aspect of the patient's right femoral stem, measuring up to 4 mm, concerning for mild loosening. The dynamic hip screw appears to have migrated centrally since the prior study, extending through the right femoral head and impacting on the acetabulum. This likely causes markedly limited range of motion. The previously noted small fracture line through the residual greater femoral trochanter demonstrates mild interval healing. Mild degenerative change is noted at the right knee, with medial chronic narrowing and marginal osteophyte formation. The known soft tissue wound is not well characterized on radiograph. No radiopaque foreign bodies are seen. IMPRESSION: 1. Known soft tissue wound is not well characterized on radiograph. No radiopaque foreign bodies seen. 2. Dynamic hip screw appears to have migrated centrally since the prior study, now extending through the right femoral head and impacting on the acetabulum. This likely causes markedly limited range of motion. 3. Lucency about the proximal aspect of the right femoral stem, measuring up to 4 mm, concerning for mild loosening. This appears to be new from the prior study. These results were called by telephone at the time of interpretation on 02/12/2015 at 12:50 am to Dr. Chiquita Loth, who verbally acknowledged these results. Electronically Signed   By: Roanna Raider M.D.    On: 02/12/2015 00:53    EKG:  Orders placed or performed during the hospital encounter of 10/20/14  . EKG 12-Lead  . EKG 12-Lead    ASSESSMENT AND PLAN:   * Right lateral thigh necrotic ulcer with surrounding Cellulitis, s/p I&D just now. leukocytosis is improving. continue IV vancomycin and Zosyn. Follow up  CBC,Blood cultures and wound cultures.  * Worsening chronic hyponatremia. Better.  Continue normal saline IV, follow-up BMP.  * Dehydration, continue IV fluid support and follow-up BMP.  * Right hip fracture status post surgery but moved hardware  * Diabetes mellitus Discontinue home metformin, continue sliding scale insulin.  * Hypertension. Continue verapamil.   Dementia. Aspiration and fall precaution.    All the records are reviewed and case discussed with Care Management/Social Workerr. Management plans discussed with the patient, family and they are in agreement.  CODE STATUS: DO NOT RESUSCITATE   TOTAL TIME TAKING CARE OF THIS PATIENT: 37 minutes.  Greater than 50% time was spent on coordination of care and face-to-face counseling.  POSSIBLE D/C IN 3 DAYS, DEPENDING ON CLINICAL CONDITION.   Shaune Pollack M.D on 02/12/2015 at 2:53 PM  Between 7am to 6pm - Pager - [phone]  After 6pm go to [email or web address] - password [email or web address] [facility]  [place] [facility]  Office  [phone]  CC: Primary care physician; No primary care provider on file.

## 2015-02-12 NOTE — Op Note (Signed)
02/11/2015 - 02/12/2015  12:22 PM  Patient:   Gina [name]  Pre-Op Diagnosis:   Necrotic ulceration, right thigh.  Post-Op Diagnosis:   Same.  Procedure:   Irrigation and debridement of necrotic ulceration, right thigh.  Surgeon:   Maryagnes Amos, MD  Assistant:   None  Anesthesia:   IV sedation  Findings:   As above.  Complications:   None  EBL:   10 cc  Fluids:   300 cc crystalloid  TT:   None  Drains:   None  Closure:   None  Implants:   None  Brief Clinical Note:   The patient is a 82 year old female who presents with an area of ulcerative necrosis on the lateral aspect of her right thigh. The patient has dementia and cannot provide a history as to how long it has been there, but according to the nursing home, it was just noticed yesterday. There is no history of insect bite or other recent trauma to the area. However, she is 3.5 months status post reduction and internal fixation of an intertrochanteric fracture of the right hip. The appearance of the necrotic ulceration is more consistent with a spider bite versus a deep wound infection emanating from the patient's recent surgery. Regardless, the patient says this time for irrigation and debridement of this area of necrotic ulceration.  Procedure:   The patient was brought into the operating room and lain in the supine position. After adequate IV sedation was achieved, the area around the thigh wound was prepped with Betadine scrub and Betadine prep solution before being draped sterilely. Perioperative antibiotics had been administered. The area measuring approximately 3 x 3.5 cm was debrided circumferentially and carried down until viable tissue was visualized. This was approximately 2 cm deep. The margins were lightly curetted to remove any additional necrotic material, as well as to identify and debride any loculations. The wound was copiously irrigated with sterile saline solution before it was packed with  approximately 2 feet of 0.5 inch iodoform gauze. A sterile compressive wet-to-dry dressing was applied over this before the patient was awakened and returned to the recovery room in satisfactory condition after tolerating the procedure well.

## 2015-02-12 NOTE — H&P (Addendum)
[facility] Physicians - [place] at [facility]   PATIENT NAME: Gina [name]    MR#:  [id number]  DATE OF BIRTH:  [birth year]  DATE OF ADMISSION:  02/11/2015  PRIMARY CARE PHYSICIAN: No primary care provider on file.   REQUESTING/REFERRING PHYSICIAN: Dr. Dolores Frame  CHIEF COMPLAINT:   Chief Complaint  Patient presents with  . Wound Infection    HISTORY OF PRESENT ILLNESS:  Gina [name]  is a 82 y.o. female with a known history of diabetes, hypertension, dementia who is bedbound since right hip fracture in October 2016 presents to the emergency room after she was noticed to have an ulcer on the right lateral thigh. Patient is unable to contribute to history due to her dementia. History obtained from old records and daughter at bedside. White count elevated at 25,000. Afebrile. X-ray of the area shows displaced hardware from her prior hip fracture surgery.  PAST MEDICAL HISTORY:   Past Medical History  Diagnosis Date  . Diabetes mellitus without complication (HCC)   . Hypertension   . Dementia   . Anxiety   . GERD (gastroesophageal reflux disease)   . Osteoporosis   . Hypothyroidism     PAST SURGICAL HISTORY:   Past Surgical History  Procedure Laterality Date  . Intramedullary (im) nail intertrochanteric Right 10/21/2014    Procedure: INTRAMEDULLARY (IM) NAIL INTERTROCHANTRIC;  Surgeon: Deeann Saint, MD;  Location: [facility] ORS;  Service: Orthopedics;  Laterality: Right;    SOCIAL HISTORY:   Social History  Substance Use Topics  . Smoking status: Never Smoker   . Smokeless tobacco: Never Used  . Alcohol Use: No    FAMILY HISTORY:   Family History  Problem Relation Age of Onset  . Family history unknown: Yes   Unknown due to dementia  DRUG ALLERGIES:   Allergies  Allergen Reactions  . Fosamax [Alendronate Sodium] Other (See Comments)    Reaction: Unknown    REVIEW OF SYSTEMS:   Review of Systems  Unable to perform ROS: dementia     MEDICATIONS AT HOME:   Prior to Admission medications   Medication Sig Start Date End Date Taking? Authorizing Provider  ALPRAZolam (XANAX) 0.25 MG tablet Take 1 tablet (0.25 mg total) by mouth 2 (two) times daily as needed for anxiety. 10/22/14  Yes Altamese Dilling, MD  ALPRAZolam Prudy Feeler) 0.5 MG tablet Take 1 tablet (0.5 mg total) by mouth at bedtime. 10/22/14  Yes Altamese Dilling, MD  atorvastatin (LIPITOR) 10 MG tablet Take 10 mg by mouth daily at 6 PM.    Yes Historical Provider, MD  calcium-vitamin D (OSCAL WITH D) 500-200 MG-UNIT tablet Take 1 tablet by mouth 2 (two) times daily. 10/22/14  Yes Altamese Dilling, MD  Cholecalciferol (VITAMIN D3) 50000 units CAPS Take 1 capsule by mouth once a week. Takes on Saturday   Yes Historical Provider, MD  ferrous sulfate 325 (65 FE) MG tablet Take 1 tablet (325 mg total) by mouth 2 (two) times daily with a meal. 10/22/14  Yes Altamese Dilling, MD  HYDROcodone-acetaminophen (NORCO/VICODIN) 5-325 MG tablet Take 1-2 tablets by mouth every 6 (six) hours as needed for moderate pain. Patient taking differently: Take 1 tablet by mouth every 12 (twelve) hours.  10/22/14  Yes Altamese Dilling, MD  levothyroxine (SYNTHROID, LEVOTHROID) 137 MCG tablet Take 137 mcg by mouth daily before breakfast.   Yes Historical Provider, MD  loperamide (IMODIUM) 2 MG capsule Take 2 mg by mouth every 4 (four) hours as needed for diarrhea or loose stools.  Yes Historical Provider, MD  Menthol-Zinc Oxide (REMEDY CALAZIME) 0.2-20 % PSTE Apply 1 application topically as needed (skin irritation). Apply to excoriated areas on sacrum   Yes Historical Provider, MD  metFORMIN (GLUCOPHAGE) 500 MG tablet Take 500 mg by mouth 2 (two) times daily with a meal.   Yes Historical Provider, MD  nystatin cream (MYCOSTATIN) Apply 1 application topically as needed (rash). Apply to groin   Yes Historical Provider, MD  omeprazole (PRILOSEC) 20 MG capsule Take 20 mg by  mouth daily.   Yes Historical Provider, MD  ranitidine (ZANTAC) 150 MG tablet Take 150 mg by mouth at bedtime.   Yes Historical Provider, MD  senna (SENOKOT) 8.6 MG TABS tablet Take 1 tablet (8.6 mg total) by mouth daily as needed for mild constipation. 10/22/14  Yes Altamese Dilling, MD  sodium chloride (OCEAN) 0.65 % SOLN nasal spray Place 2 sprays into both nostrils every 6 (six) hours as needed for congestion.    Yes Historical Provider, MD  sodium chloride 1 G tablet Take 1 g by mouth 2 (two) times daily with a meal.   Yes Historical Provider, MD  traZODone (DESYREL) 50 MG tablet Take 50 mg by mouth at bedtime.   Yes Historical Provider, MD  verapamil (VERELAN PM) 240 MG 24 hr capsule Take 240 mg by mouth daily.    Yes Historical Provider, MD  vitamin C (ASCORBIC ACID) 500 MG tablet Take 500 mg by mouth daily.   Yes Historical Provider, MD  cefUROXime (CEFTIN) 250 MG tablet Take 1 tablet (250 mg total) by mouth 2 (two) times daily with a meal. For 2 days Patient not taking: Reported on 02/12/2015 10/22/14   Altamese Dilling, MD      VITAL SIGNS:  Blood pressure 104/46, pulse 54, temperature 99.1 F (37.3 C), resp. rate 16, height 5\' 3"  (1.6 m), SpO2 100 %.  PHYSICAL EXAMINATION:  Physical Exam  GENERAL:  82 y.o.-year-old patient lying in the bed with no acute distress.  EYES: Pupils equal, round, reactive to light and accommodation. No scleral icterus. Extraocular muscles intact.  HEENT: Head atraumatic, normocephalic. Oropharynx and nasopharynx clear. No oropharyngeal erythema, moist oral mucosa  NECK:  Supple, no jugular venous distention. No thyroid enlargement, no tenderness.  LUNGS: Normal breath sounds bilaterally, no wheezing, rales, rhonchi. No use of accessory muscles of respiration.  CARDIOVASCULAR: S1, S2 normal. No murmurs, rubs, or gallops.  ABDOMEN: Soft, nontender, nondistended. Bowel sounds present. No organomegaly or mass.  EXTREMITIES: No pedal edema,  cyanosis, or clubbing. + 2 pedal & radial pulses b/l.   NEUROLOGIC: Cranial nerves II through XII are intact. No focal Motor or sensory deficits appreciated b/l PSYCHIATRIC: The patient is awake. Pleasantly confused. SKIN: Right lateral thigh ulcer 4 cm diameter. Induration surrounding it. No discharge.  LABORATORY PANEL:   CBC  Recent Labs Lab 02/12/15 0012  WBC 25.4*  HGB 10.9*  HCT 32.7*  PLT 187   ------------------------------------------------------------------------------------------------------------------  Chemistries   Recent Labs Lab 02/12/15 0012  NA 120*  K 5.3*  CL 90*  CO2 21*  GLUCOSE 202*  BUN 27*  CREATININE 1.20*  CALCIUM 9.5   ------------------------------------------------------------------------------------------------------------------  Cardiac Enzymes No results for input(s): TROPONINI in the last 168 hours. ------------------------------------------------------------------------------------------------------------------  RADIOLOGY:  Dg Femur, Min 2 Views Right  02/12/2015  CLINICAL DATA:  Acute onset of right thigh wound, thought to reflect a spider bite. Initial encounter. EXAM: RIGHT FEMUR 2 VIEWS COMPARISON:  Right hip radiographs performed 11/06/2014 FINDINGS: There is lucency  about the proximal aspect of the patient's right femoral stem, measuring up to 4 mm, concerning for mild loosening. The dynamic hip screw appears to have migrated centrally since the prior study, extending through the right femoral head and impacting on the acetabulum. This likely causes markedly limited range of motion. The previously noted small fracture line through the residual greater femoral trochanter demonstrates mild interval healing. Mild degenerative change is noted at the right knee, with medial chronic narrowing and marginal osteophyte formation. The known soft tissue wound is not well characterized on radiograph. No radiopaque foreign bodies are seen.  IMPRESSION: 1. Known soft tissue wound is not well characterized on radiograph. No radiopaque foreign bodies seen. 2. Dynamic hip screw appears to have migrated centrally since the prior study, now extending through the right femoral head and impacting on the acetabulum. This likely causes markedly limited range of motion. 3. Lucency about the proximal aspect of the right femoral stem, measuring up to 4 mm, concerning for mild loosening. This appears to be new from the prior study. These results were called by telephone at the time of interpretation on 02/12/2015 at 12:50 am to Dr. Chiquita Loth, who verbally acknowledged these results. Electronically Signed   By: Roanna Raider M.D.   On: 02/12/2015 00:53     IMPRESSION AND PLAN:   * Right lateral thigh ulcer with surrounding Cellulitis Start broad-spectrum antibiotics with IV vancomycin and Zosyn. Blood cultures and wound cultures have been sent. Likely from pressure ulcer. Will need surgical consult if any worsening.  * Hyponatremia Patient has a chronic mild hyponatremia. This is worse at this time. Likely from decreased solute load from malnutrition. Was started on IV fluids and monitor.  * Right hip fracture status post surgery but moved hardware Patient is bedbound and unlikely to benefit from further surgeries. We'll consult orthopedics for further input.  * Diabetes mellitus Continue home medications along with sliding scale insulin.  * Hypertension Continue medications  * DVT prophylaxis Lovenox   All the records are reviewed and case discussed with ED provider. Management plans discussed with the patient, family and they are in agreement.  CODE STATUS: DNR  TOTAL TIME TAKING CARE OF THIS PATIENT: 35 minutes.    Milagros Loll R M.D on 02/12/2015 at 3:37 AM  Between 7am to 6pm - Pager - [phone]  After 6pm go to [email or web address] - password [email or web address] [facility]  [place] [facility]  Office  [phone]  CC: Primary  care physician; No primary care provider on file.   Note: This dictation was prepared with Dragon dictation along with smaller phrase technology. Any transcriptional errors that result from this process are unintentional.

## 2015-02-13 LAB — CBC WITH DIFFERENTIAL/PLATELET
Basophils Absolute: 0 10*3/uL (ref 0–0.1)
Basophils Relative: 0 %
EOS ABS: 0.3 10*3/uL (ref 0–0.7)
Eosinophils Relative: 2 %
HEMATOCRIT: 29.3 % — AB (ref 35.0–47.0)
Hemoglobin: 10 g/dL — ABNORMAL LOW (ref 12.0–16.0)
LYMPHS ABS: 2.6 10*3/uL (ref 1.0–3.6)
Lymphocytes Relative: 16 %
MCH: 31.4 pg (ref 26.0–34.0)
MCHC: 34.1 g/dL (ref 32.0–36.0)
MCV: 92.2 fL (ref 80.0–100.0)
MONO ABS: 1.5 10*3/uL — AB (ref 0.2–0.9)
MONOS PCT: 9 %
NEUTROS ABS: 12.1 10*3/uL — AB (ref 1.4–6.5)
NEUTROS PCT: 73 %
PLATELETS: 188 10*3/uL (ref 150–440)
RBC: 3.18 MIL/uL — ABNORMAL LOW (ref 3.80–5.20)
RDW: 14.1 % (ref 11.5–14.5)
WBC: 16.5 10*3/uL — ABNORMAL HIGH (ref 3.6–11.0)

## 2015-02-13 LAB — GLUCOSE, CAPILLARY
GLUCOSE-CAPILLARY: 169 mg/dL — AB (ref 65–99)
Glucose-Capillary: 103 mg/dL — ABNORMAL HIGH (ref 65–99)
Glucose-Capillary: 180 mg/dL — ABNORMAL HIGH (ref 65–99)
Glucose-Capillary: 179 mg/dL — ABNORMAL HIGH (ref 65–99)

## 2015-02-13 LAB — BASIC METABOLIC PANEL
Anion gap: 5 (ref 5–15)
BUN: 21 mg/dL — AB (ref 6–20)
CALCIUM: 8.7 mg/dL — AB (ref 8.9–10.3)
CO2: 28 mmol/L (ref 22–32)
Chloride: 92 mmol/L — ABNORMAL LOW (ref 101–111)
Creatinine, Ser: 0.94 mg/dL (ref 0.44–1.00)
GFR calc Af Amer: 58 mL/min — ABNORMAL LOW (ref >60)
GFR, EST NON AFRICAN AMERICAN: 50 mL/min — AB (ref >60)
GLUCOSE: 115 mg/dL — AB (ref 65–99)
Potassium: 4.2 mmol/L (ref 3.5–5.1)
Sodium: 125 mmol/L — ABNORMAL LOW (ref 135–145)

## 2015-02-13 LAB — URINALYSIS COMPLETE WITH MICROSCOPIC (ARMC ONLY)
BILIRUBIN URINE: NEGATIVE
Glucose, UA: NEGATIVE mg/dL
KETONES UR: NEGATIVE mg/dL
NITRITE: NEGATIVE
PROTEIN: 100 mg/dL — AB
Specific Gravity, Urine: 1.009 (ref 1.005–1.030)
pH: 6 (ref 5.0–8.0)

## 2015-02-13 MED ORDER — LEVOFLOXACIN IN D5W 750 MG/150ML IV SOLN
750.0000 mg | INTRAVENOUS | Status: DC
  Administered 2015-02-13: 750 mg via INTRAVENOUS
  Filled 2015-02-13: qty 150

## 2015-02-13 MED ORDER — LEVOFLOXACIN IN D5W 750 MG/150ML IV SOLN: 750.0000 mg | INTRAVENOUS | Status: DC

## 2015-02-13 NOTE — Progress Notes (Signed)
General surgery consultation was requested on this patient for a wound management problem. The patient has undergone a wound debridement on 2/11 by the orthopedic service. The wound was evaluated this evening and appears to be clean without evidence of further necrotic tissue. I do not see any indication for surgical intervention. I will not provide a formal consultation this patient appears to be progressing well under the orthopedic service's care.  She did have some significant lower extremity swelling likely related to the Ace wrap on her upper thigh. I would suggest consideration for wound VAC therapy in the near future.

## 2015-02-13 NOTE — Progress Notes (Signed)
Patient's urinalysis is abnormal with WBC clumps and leukoesterase. Patient is started on empiric levofloxacin

## 2015-02-13 NOTE — Progress Notes (Signed)
Order to place a foley cath, peri care given, explained procedure to patient, 14 fr foley cath placed, yellow urine with sediments then cloudy urine seen in tubing. Urine specimen obtained and sent to lab.

## 2015-02-13 NOTE — Progress Notes (Signed)
Spoke with Dr.Chen regarding getting wound care consult  to assess patient's pressure ulcer on sacral and right buttock necrotic area.

## 2015-02-13 NOTE — Consult Note (Signed)
WOC wound consult note Reason for Consult: Buttocks with induration, erythema and small area of necrosis. Patient's daughter was told it resembled a "spider bite". Wound type:infectious  Pressure Ulcer POA: No Measurement:bilateral buttocks with intense erythema and induration, necrotic area on right lower buttock measures 2cm x 1cm with wound bed obscured by the presence of necrotic black, tissue. Wound bed:As described above Drainage (amount, consistency, odor) As described above Periwound:As described above Dressing procedure/placement/frequency: I will implement a sleep surface with low air loss feature and continue Nursing's efforts to turn side to side and minimize time in the supine position (except for meals). Suggest a surgical consult for the evaluation of the buttocks as the area of induration could be an abscess or other infectious process. If you agree, please order.  I will remove all containment garments (diapers) and reduce the number of under pads to one-nondisposable.  Patient with intertriginous dermatitis (Moisture) in the inguinal areas. WOC nursing team will not follow, but will remain available to this patient, the nursing and medical teams.  Please re-consult if needed. Thanks, Ladona Mow, MSN, RN, GNP, [place]  Pager# [phone]

## 2015-02-13 NOTE — Progress Notes (Signed)
Dr.Gouru informed of urinalysis results- MD to place new order.

## 2015-02-13 NOTE — Progress Notes (Signed)
[facility] Physicians - [place] at [facility]   PATIENT NAME: Gina [name]    MR#:  [id number]  DATE OF BIRTH:  04-12-1917  SUBJECTIVE:  CHIEF COMPLAINT:   Chief Complaint  Patient presents with  . Wound Infection   The patient is a demented, no complaint. REVIEW OF SYSTEMS:  Unable to get ROS.  DRUG ALLERGIES:   Allergies  Allergen Reactions  . Fosamax [Alendronate Sodium] Other (See Comments)    Reaction: Unknown    VITALS:  Blood pressure 138/57, pulse 72, temperature 98.7 F (37.1 C), temperature source Oral, resp. rate 16, height  (1.626 m), weight 71.668 kg (158 lb), SpO2 100 %.  PHYSICAL EXAMINATION:  GENERAL:  82 y.o.-year-old patient lying in the bed with no acute distress.  EYES: Pupils equal, round, reactive to light and accommodation. No scleral icterus. Extraocular muscles intact.  HEENT: Head atraumatic, normocephalic. Oropharynx and nasopharynx clear.  NECK:  Supple, no jugular venous distention. No thyroid enlargement, no tenderness.  LUNGS: Normal breath sounds bilaterally, no wheezing, rales,rhonchi or crepitation. No use of accessory muscles of respiration.  CARDIOVASCULAR: S1, S2 normal. No murmurs, rubs, or gallops.  ABDOMEN: Soft, nontender, nondistended. Bowel sounds present. No organomegaly or mass.  EXTREMITIES: No pedal edema, cyanosis, or clubbing. Right thigh in dressing. NEUROLOGIC: Unable to exam. PSYCHIATRIC: The patient is demented. 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   LABORATORY PANEL:   CBC  Recent Labs Lab 02/13/15 0608  WBC 16.5*  HGB 10.0*  HCT 29.3*  PLT 188   ------------------------------------------------------------------------------------------------------------------  Chemistries   Recent Labs Lab 02/13/15 0608  NA 125*  K 4.2  CL 92*  CO2 28  GLUCOSE 115*  BUN 21*  CREATININE 0.94  CALCIUM 8.7*    ------------------------------------------------------------------------------------------------------------------  Cardiac Enzymes No results for input(s): TROPONINI in the last 168 hours. ------------------------------------------------------------------------------------------------------------------  RADIOLOGY:  Dg Femur, Min 2 Views Right  02/12/2015  CLINICAL DATA:  Acute onset of right thigh wound, thought to reflect a spider bite. Initial encounter. EXAM: RIGHT FEMUR 2 VIEWS COMPARISON:  Right hip radiographs performed 11/06/2014 FINDINGS: There is lucency about the proximal aspect of the patient's right femoral stem, measuring up to 4 mm, concerning for mild loosening. The dynamic hip screw appears to have migrated centrally since the prior study, extending through the right femoral head and impacting on the acetabulum. This likely causes markedly limited range of motion. The previously noted small fracture line through the residual greater femoral trochanter demonstrates mild interval healing. Mild degenerative change is noted at the right knee, with medial chronic narrowing and marginal osteophyte formation. The known soft tissue wound is not well characterized on radiograph. No radiopaque foreign bodies are seen. IMPRESSION: 1. Known soft tissue wound is not well characterized on radiograph. No radiopaque foreign bodies seen. 2. Dynamic hip screw appears to have migrated centrally since the prior study, now extending through the right femoral head and impacting on the acetabulum. This likely causes markedly limited range of motion. 3. Lucency about the proximal aspect of the right femoral stem, measuring up to 4 mm, concerning for mild loosening. This appears to be new from the prior study. These results were called by telephone at the time of interpretation on 02/12/2015 at 12:50 am to Dr. Chiquita Loth, who verbally acknowledged these results. Electronically Signed   By: Roanna Raider M.D.    On: 02/12/2015 00:53    EKG:   Orders placed or performed during the hospital encounter  of 10/20/14  . EKG 12-Lead  . EKG 12-Lead    ASSESSMENT AND PLAN:   * Right lateral thigh necrotic ulcer with surrounding Cellulitis, s/p I&D,   leukocytosis is improving. Started ancef iv,  Zosyn and vancomycin were discontinued. Wound culture: MSSA. Follow up  CBC, Blood cultures is negative so far.  * Worsening chronic hyponatremia. Better.  Continue normal saline IV, follow-up BMP.  * Dehydration, improved with IV fluid support.  * Right hip fracture status post surgery but moved hardware  * Diabetes mellitus Discontinue home metformin, continue sliding scale insulin.  * Hypertension. Continue verapamil.   Dementia. Aspiration and fall precaution.  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  Wound care consult.  All the records are reviewed and case discussed with Care Management/Social Workerr. Management plans discussed with the patient, family and they are in agreement.  CODE STATUS: DO NOT RESUSCITATE   TOTAL TIME TAKING CARE OF THIS PATIENT: 37 minutes.  Greater than 50% time was spent on coordination of care and face-to-face counseling.  POSSIBLE D/C IN 2 DAYS, DEPENDING ON CLINICAL CONDITION.   Shaune Pollack M.D on 02/13/2015 at 1:12 PM  Between 7am to 6pm - Pager - [phone]  After 6pm go to [email or web address] - password [email or web address]  [facility]  Office  [phone]  CC: Primary care physician; No primary care provider on file.

## 2015-02-13 NOTE — Care Management Note (Addendum)
Case Management Note  Patient Details  Name: ALEXANDRE LIGHTSEY MRN: [id number] Date of Birth: [birth year]  Subjective/Objective:   82yo Mrs Melynda Krzywicki is from [facility]. Anticipate return to [facility] after discharge. Unable to perform case management assessment due to dementia. Mrs bick has been bedbound since her right hip surgical repair on Oct 2016. Current x-rays show some displaced hardware in the right hip. Mrs Resnick is currently being treated for right thigh cellulitis. She received am I&D of the right thigh on 02/12/15.                 Action/Plan:   Expected Discharge Date:                  Expected Discharge Plan:     In-House Referral:     Discharge planning Services     Post Acute Care Choice:    Choice offered to:     DME Arranged:    DME Agency:     HH Arranged:    HH Agency:     Status of Service:     Medicare Important Message Given:    Date Medicare IM Given:    Medicare IM give by:    Date Additional Medicare IM Given:    Additional Medicare Important Message give by:     If discussed at Long Length of Stay Meetings, dates discussed:    Additional Comments:  Pallavi Clifton A, RN 02/13/2015, 2:34 PM

## 2015-02-13 NOTE — Evaluation (Signed)
Physical Therapy Evaluation Patient Details Name: Gina [name] MRN: [id number] DOB: May 09, 1917 Today's Date: 02/13/2015   History of Present Illness  82 yo Female came to ED with red swelling on right lateral thigh. She was admitted with cellulitis. Patient has an old right hip Fx s/p ORIF in Oct 2016. She has been mostly bed bound since surgery. X-rays show hardware displaced with nail in acetabulum. Currently Ortho states she is not appropriate for surgery to fix hardware at this time.She is s/p debridement. RLE is WBAT; Patient has dementia at baseline and is not oriented to situation, place or date; She is oriented to self;   Clinical Impression  *82 yo Female came to ED with redness on right lateral thigh; She was diagnosed with cellulitis and is s/p I&D on 02/12/15. Patient has old RLE hip ORIF and recent x-rays show hardware has been displaced with nail moved into acetabulum. PT confirmed with ortho that patient is WBAT. She has been mostly bed bound since Oct 2016 (hip surgery) and requires assistance with all ADLs at baseline. She has dementia and is not oriented to place, date or situation. She is very pleasant and able to follow single step commands. Currently patient is min A for bed mobility with mod VCS for positioning. She is min A for sit<>Stand transfers. She is mod A for take a few steps with RW for pivot to bedside chair. Patient demonstrates decreased right weight shifting with shorter left step length. She would benefit from additional skilled PT intervention to improve LE strength,balance and gait safety; Currently her clinical presentation is evolving as she has intermittent hip pain with movement with decreased stance tolerance and decreased cognition.     Follow Up Recommendations SNF    Equipment Recommendations  None recommended by PT    Recommendations for Other Services       Precautions / Restrictions Precautions Precautions: Fall Restrictions Weight Bearing  Restrictions: No RLE Weight Bearing: Weight bearing as tolerated LLE Weight Bearing: Weight bearing as tolerated      Mobility  Bed Mobility Overal bed mobility: Needs Assistance Bed Mobility: Supine to Sit;Sit to Supine     Supine to sit: Min assist;HOB elevated Sit to supine: Min assist;HOB elevated   General bed mobility comments: Patient transitioned supine to sitting edge of bed with min A and mod VCs for hand placement to pull on bed rails to scoot edge of bed; she is able to follow simple commands; Patient initially hesitant to move RLE but then able to scoot with cues; Performed supine<>sitting x2 reps;  Transfers Overall transfer level: Needs assistance Equipment used: Rolling walker (2 wheeled) Transfers: Sit to/from Stand Sit to Stand: Min assist         General transfer comment: Patient is min A for sit<>stand transfer; requires mod VCS for hand placement; able to stand fully erect with CGA;   Ambulation/Gait Ambulation/Gait assistance: Mod assist Ambulation Distance (Feet): 2 Feet Assistive device: Rolling walker (2 wheeled) Gait Pattern/deviations: Step-to pattern;Decreased step length - left;Decreased stance time - right;Decreased weight shift to right;Narrow base of support Gait velocity: decreased   General Gait Details: Patient takes 2 steps to bedside chair with RW; mod A for balance with turning; Patient exhibits decreased tolerance with RLE WB, with step to gait pattern;   Stairs            Wheelchair Mobility    Modified Rankin (Stroke Patients Only)       Balance Overall balance assessment: Needs  assistance Sitting-balance support: Bilateral upper extremity supported;Feet supported Sitting balance-Leahy Scale: Fair Sitting balance - Comments: with initial sitting, exhibited increased posterior lean; however 2nd time sitting edge of bed was able to hold self up without assistance with good balance control;  Postural control: Posterior  lean Standing balance support: Bilateral upper extremity supported Standing balance-Leahy Scale: Fair Standing balance comment: Patient requires CGA for standing with RW for static standing balance; she does exhibit poor dynamic standing balance requiring mod A with turns;                              Pertinent Vitals/Pain Pain Assessment: 0-10 Pain Location: Patient reports a little soreness in right hip with movement; unable to provide pain number; grimaces with activity;  Pain Intervention(s): Limited activity within patient's tolerance;Monitored during session;Repositioned    Home Living Family/patient expects to be discharged to:: Skilled nursing facility                 Additional Comments: Was at [facility] for 2 years    Prior Function Level of Independence: Needs assistance   Gait / Transfers Assistance Needed: Patient mostly bed bound; did use a RW last year for some ambulation prior to hip surgery;      Comments: needs assistance for all ADLs;      Hand Dominance   Dominant Hand: Right    Extremity/Trunk Assessment   Upper Extremity Assessment: Overall WFL for tasks assessed           Lower Extremity Assessment: RLE deficits/detail;LLE deficits/detail RLE Deficits / Details: Patient unable to follow commands for MMT, however hip is 3-/5 as unable to fully lift against gravity; knee and ankle 3/5 LLE Deficits / Details: LLE is [facility]     Communication   Communication: No difficulties  Cognition Arousal/Alertness: Awake/alert Behavior During Therapy: WFL for tasks assessed/performed Overall Cognitive Status: History of cognitive impairments - at baseline                      General Comments General comments (skin integrity, edema, etc.): does have redness on right lateral thigh; dressing covered; otherwise intact skin by gross assessment;     Exercises Other Exercises Other Exercises: Instructed patient in BLE strengthening  exericse in sitting: ankle pumps x10, SLR flexion x10 bilaterally with decreased ROM on RLE; educated patient in scooting in chair for better positioning; Patient required mod VCS for increased ROM and to slow down LE movement for better control especially with eccentric lower;       Assessment/Plan    PT Assessment Patient needs continued PT services  PT Diagnosis Abnormality of gait;Generalized weakness   PT Problem List Decreased strength;Decreased cognition;Decreased range of motion;Decreased activity tolerance;Decreased safety awareness;Decreased balance;Pain;Decreased mobility  PT Treatment Interventions DME instruction;Balance training;Gait training;Neuromuscular re-education;Patient/family education;Functional mobility training;Therapeutic activities;Therapeutic exercise;Wheelchair mobility training   PT Goals (Current goals can be found in the Care Plan section) Acute Rehab PT Goals Patient Stated Goal: "I would like to get up and walk out of here if I could" PT Goal Formulation: With patient Time For Goal Achievement: 02/27/15 Potential to Achieve Goals: Fair    Frequency Min 2X/week   Barriers to discharge   NA    Co-evaluation               End of Session Equipment Utilized During Treatment: Gait belt Activity Tolerance: Patient tolerated treatment well Patient left: in  chair;with call bell/phone within reach;with chair alarm set Nurse Communication: Mobility status         Time: 0850 (left room at 9:00, and then resumed eval at 10:00)-1015 PT started evaluation at 8:50 and then nursing wanted patient to stay in bed for dressing changed. PT left room at 9:00 and then resumed PT eval at 10:00 for 25 min total in room.;  PT Time Calculation (min) (ACUTE ONLY): 85 min   Charges:   PT Evaluation $PT Eval Moderate Complexity: 1 Procedure PT Treatments $Therapeutic Exercise: 8-22 mins   PT G Codes:        Jazyah Butsch PT, DPT 02/13/2015, 10:33 AM

## 2015-02-13 NOTE — Progress Notes (Signed)
  Subjective: 1 Day Post-Op Procedure(s) (LRB): DEBRIDEMENT AND CLOSURE WOUND (Right) Patient reports pain as moderate.   Patient is well, and has had no acute complaints or problems Plan is to go Skilled nursing facility after hospital stay. Negative for chest pain and shortness of breath Fever: no Gastrointestinal:negative for nausea and vomiting  Objective: Vital signs in last 24 hours: Temp:  [98.1 F (36.7 C)-99 F (37.2 C)] 98.7 F (37.1 C) (02/12 0734) Pulse Rate:  [72-98] 72 (02/12 0734) Resp:  [14-18] 16 (02/12 0734) BP: (119-170)/(46-83) 138/57 mmHg (02/12 0734) SpO2:  [97 %-100 %] 100 % (02/12 0734) Weight:  [71.668 kg (158 lb)] 71.668 kg (158 lb) (02/12 0542)  Intake/Output from previous day:  Intake/Output Summary (Last 24 hours) at 02/13/15 0915 Last data filed at 02/13/15 0747  Gross per 24 hour  Intake 2283.75 ml  Output     10 ml  Net 2273.75 ml    Intake/Output this shift: Total I/O In: 1067.5 [I.V.:1067.5] Out: 0   Labs:  Recent Labs  02/12/15 0012 02/12/15 0746 02/13/15 0608  HGB 10.9* 10.9* 10.0*    Recent Labs  02/12/15 0746 02/13/15 0608  WBC 18.2* 16.5*  RBC 3.50* 3.18*  HCT 32.3* 29.3*  PLT 180 188    Recent Labs  02/12/15 0746 02/13/15 0608  NA 124* 125*  K 4.6 4.2  CL 93* 92*  CO2 23 28  BUN 25* 21*  CREATININE 1.05* 0.94  GLUCOSE 130* 115*  CALCIUM 9.3 8.7*   No results for input(s): LABPT, INR in the last 72 hours.   EXAM General - Patient is Alert, Appropriate and Confused Extremity - ABD soft Sensation intact distally Intact pulses distally Dorsiflexion/Plantar flexion intact Dressing/Incision - The post-op dressing was removed and a sterile wet-to dry dressing was placed over the wound with the assistance of the nursing staff. Motor Function - intact, moving foot and toes well on exam.   Skin examination of the right thigh demonstrated a 3 x 3.5 cm circumferential necrotic lesion approx 2.5 cm deep.   There does not appear to be surrounding erythema, there was minimal bloody drainage from the post-op bandage.  Wound was re-wrapped with an ACE bandage.  Past Medical History  Diagnosis Date  . Diabetes mellitus without complication (HCC)   . Hypertension   . Dementia   . Anxiety   . GERD (gastroesophageal reflux disease)   . Osteoporosis   . Hypothyroidism     Assessment/Plan: 1 Day Post-Op Procedure(s) (LRB): DEBRIDEMENT AND CLOSURE WOUND (Right) Active Problems:   Cellulitis  Estimated body mass index is 27.11 kg/(m^2) as calculated from the following:   Height as of this encounter:  (1.626 m).   Weight as of this encounter: 71.668 kg (158 lb). Advance diet Up with therapy   Continue IV Abx. Daily wet-to-dry dressing changes for the right thigh wound.  DVT Prophylaxis - Lovenox, Foot Pumps and TED hose Weight-Bearing as tolerated to right leg  J. Horris Latino, PA-C [facility] Orthopaedic Surgery 02/13/2015, 9:15 AM

## 2015-02-14 ENCOUNTER — Encounter: Payer: Self-pay | Admitting: Surgery

## 2015-02-14 DIAGNOSIS — L89152 Pressure ulcer of sacral region, stage 2: Secondary | ICD-10-CM | POA: Diagnosis not present

## 2015-02-14 DIAGNOSIS — Z48817 Encounter for surgical aftercare following surgery on the skin and subcutaneous tissue: Secondary | ICD-10-CM | POA: Diagnosis not present

## 2015-02-14 DIAGNOSIS — F419 Anxiety disorder, unspecified: Secondary | ICD-10-CM | POA: Diagnosis not present

## 2015-02-14 DIAGNOSIS — E871 Hypo-osmolality and hyponatremia: Secondary | ICD-10-CM | POA: Diagnosis not present

## 2015-02-14 DIAGNOSIS — F0390 Unspecified dementia without behavioral disturbance: Secondary | ICD-10-CM | POA: Diagnosis not present

## 2015-02-14 DIAGNOSIS — L97119 Non-pressure chronic ulcer of right thigh with unspecified severity: Secondary | ICD-10-CM | POA: Diagnosis not present

## 2015-02-14 DIAGNOSIS — K219 Gastro-esophageal reflux disease without esophagitis: Secondary | ICD-10-CM | POA: Diagnosis not present

## 2015-02-14 DIAGNOSIS — E7800 Pure hypercholesterolemia, unspecified: Secondary | ICD-10-CM | POA: Diagnosis not present

## 2015-02-14 DIAGNOSIS — N189 Chronic kidney disease, unspecified: Secondary | ICD-10-CM | POA: Diagnosis not present

## 2015-02-14 DIAGNOSIS — I10 Essential (primary) hypertension: Secondary | ICD-10-CM | POA: Diagnosis not present

## 2015-02-14 DIAGNOSIS — E039 Hypothyroidism, unspecified: Secondary | ICD-10-CM | POA: Diagnosis not present

## 2015-02-14 DIAGNOSIS — G4700 Insomnia, unspecified: Secondary | ICD-10-CM | POA: Diagnosis not present

## 2015-02-14 DIAGNOSIS — E1122 Type 2 diabetes mellitus with diabetic chronic kidney disease: Secondary | ICD-10-CM | POA: Diagnosis not present

## 2015-02-14 DIAGNOSIS — M810 Age-related osteoporosis without current pathological fracture: Secondary | ICD-10-CM | POA: Diagnosis not present

## 2015-02-14 DIAGNOSIS — G300 Alzheimer's disease with early onset: Secondary | ICD-10-CM | POA: Diagnosis not present

## 2015-02-14 DIAGNOSIS — Z9181 History of falling: Secondary | ICD-10-CM | POA: Diagnosis not present

## 2015-02-14 DIAGNOSIS — L03115 Cellulitis of right lower limb: Secondary | ICD-10-CM | POA: Diagnosis not present

## 2015-02-14 LAB — CBC
HCT: 28.1 % — ABNORMAL LOW (ref 35.0–47.0)
HEMOGLOBIN: 9.6 g/dL — AB (ref 12.0–16.0)
MCH: 31.2 pg (ref 26.0–34.0)
MCHC: 34.3 g/dL (ref 32.0–36.0)
MCV: 91 fL (ref 80.0–100.0)
PLATELETS: 198 10*3/uL (ref 150–440)
RBC: 3.09 MIL/uL — ABNORMAL LOW (ref 3.80–5.20)
RDW: 13.7 % (ref 11.5–14.5)
WBC: 12.8 10*3/uL — ABNORMAL HIGH (ref 3.6–11.0)

## 2015-02-14 LAB — MAGNESIUM: MAGNESIUM: 1.2 mg/dL — AB (ref 1.7–2.4)

## 2015-02-14 LAB — BASIC METABOLIC PANEL
Anion gap: 3 — ABNORMAL LOW (ref 5–15)
BUN: 18 mg/dL (ref 6–20)
CHLORIDE: 94 mmol/L — AB (ref 101–111)
CO2: 29 mmol/L (ref 22–32)
Calcium: 8.7 mg/dL — ABNORMAL LOW (ref 8.9–10.3)
Creatinine, Ser: 0.87 mg/dL (ref 0.44–1.00)
GFR calc Af Amer: >60 mL/min (ref >60)
GFR, EST NON AFRICAN AMERICAN: 55 mL/min — AB (ref >60)
GLUCOSE: 105 mg/dL — AB (ref 65–99)
Potassium: 4.5 mmol/L (ref 3.5–5.1)
SODIUM: 126 mmol/L — AB (ref 135–145)

## 2015-02-14 LAB — GLUCOSE, CAPILLARY: Glucose-Capillary: 174 mg/dL — ABNORMAL HIGH (ref 65–99)

## 2015-02-14 MED ORDER — CEPHALEXIN 500 MG PO CAPS: 500.0000 mg | ORAL_CAPSULE | Freq: Four times a day (QID) | ORAL | Status: DC

## 2015-02-14 MED ORDER — ALPRAZOLAM 0.5 MG PO TABS: 0.5000 mg | ORAL_TABLET | Freq: Every day | ORAL | Status: DC

## 2015-02-14 MED ORDER — MAGNESIUM OXIDE 400 (241.3 MG) MG PO TABS
400.0000 mg | ORAL_TABLET | Freq: Once | ORAL | Status: AC
  Administered 2015-02-14: 400 mg via ORAL
  Filled 2015-02-14: qty 1

## 2015-02-14 MED ORDER — HYDROCODONE-ACETAMINOPHEN 5-325 MG PO TABS: 1.0000 | ORAL_TABLET | Freq: Four times a day (QID) | ORAL | Status: DC | PRN

## 2015-02-14 MED ORDER — ALPRAZOLAM 0.25 MG PO TABS: 0.2500 mg | ORAL_TABLET | Freq: Two times a day (BID) | ORAL | Status: DC | PRN

## 2015-02-14 MED ORDER — MAGNESIUM SULFATE 4 GM/100ML IV SOLN
4.0000 g | Freq: Once | INTRAVENOUS | Status: AC
  Administered 2015-02-14: 4 g via INTRAVENOUS
  Filled 2015-02-14: qty 100

## 2015-02-14 NOTE — Progress Notes (Signed)
  Subjective: 2 Days Post-Op Procedure(s) (LRB): DEBRIDEMENT AND CLOSURE WOUND (Right) Patient reports pain as mild.   Patient is well, and has had no acute complaints or problems Plan is to go Skilled nursing facility after hospital stay. Negative for chest pain and shortness of breath Fever: no Gastrointestinal:negative for nausea and vomiting  Objective: Vital signs in last 24 hours: Temp:  [97.9 F (36.6 C)-98.6 F (37 C)] 98.6 F (37 C) (02/13 0429) Pulse Rate:  [70-99] 70 (02/13 0429) Resp:  [16-18] 18 (02/13 0429) BP: (140-153)/(51-66) 142/51 mmHg (02/13 0429) SpO2:  [98 %-100 %] 99 % (02/13 0429) Weight:  [71.753 kg (158 lb 3 oz)] 71.753 kg (158 lb 3 oz) (02/13 0500)  Intake/Output from previous day:  Intake/Output Summary (Last 24 hours) at 02/14/15 0753 Last data filed at 02/14/15 0737  Gross per 24 hour  Intake 2131.75 ml  Output    775 ml  Net 1356.75 ml    Intake/Output this shift: Total I/O In: 1161.3 [I.V.:1161.3] Out: 0   Labs:  Recent Labs  02/12/15 0012 02/12/15 0746 02/13/15 0608 02/14/15 0509  HGB 10.9* 10.9* 10.0* 9.6*    Recent Labs  02/13/15 0608 02/14/15 0509  WBC 16.5* 12.8*  RBC 3.18* 3.09*  HCT 29.3* 28.1*  PLT 188 198    Recent Labs  02/13/15 0608 02/14/15 0509  NA 125* 126*  K 4.2 4.5  CL 92* 94*  CO2 28 29  BUN 21* 18  CREATININE 0.94 0.87  GLUCOSE 115* 105*  CALCIUM 8.7* 8.7*   No results for input(s): LABPT, INR in the last 72 hours.   EXAM General - Patient is Alert, Appropriate and Confused Extremity - ABD soft Sensation intact distally Intact pulses distally Dorsiflexion/Plantar flexion intact Dressing/Incision - My original dressing was removed by general surgery who placed a new wet-to-dry dressing with a ABD taped over the wound without an ace wrap.  The ABD pad that was placed over the wound shows no drainage so it was left in place over the new wet-to-dry dressing. Motor Function - intact, moving  foot and toes well on exam.   Skin examination of the right thigh demonstrated a 3 x 3.5 cm circumferential necrotic lesion approx 2.5 cm deep.  There does not appear to be surrounding erythema, there was minimal bloody drainage from the post-op bandage.   Past Medical History  Diagnosis Date  . Diabetes mellitus without complication (HCC)   . Hypertension   . Dementia   . Anxiety   . GERD (gastroesophageal reflux disease)   . Osteoporosis   . Hypothyroidism     Assessment/Plan: 2 Days Post-Op Procedure(s) (LRB): DEBRIDEMENT AND CLOSURE WOUND (Right) Active Problems:   Cellulitis  Estimated body mass index is 27.14 kg/(m^2) as calculated from the following:   Height as of this encounter:  (1.626 m).   Weight as of this encounter: 71.753 kg (158 lb 3 oz). Advance diet Up with therapy   Continue IV Abx. Replaced wet-to-dry dressing today, did not ACE wrap the right leg today. Will plan on wound clinic referral upon discharge from the hospital.  DVT Prophylaxis - Lovenox, Foot Pumps and TED hose Weight-Bearing as tolerated to right leg  J. Horris Latino, PA-C [facility] Orthopaedic Surgery 02/14/2015, 7:53 AM

## 2015-02-14 NOTE — Discharge Instructions (Signed)
Heart healthy and ADA diet. °Activity as tolerated. °Wound care. °

## 2015-02-14 NOTE — Progress Notes (Signed)
Per Dr. Imogene Burn patient had a positive blood culture after she was discharged back to [facility]. Per ID doctor patient will need a PICC line after 2 negative cultures and at the minimum 2 weeks of IV Cefazolin 2 grams every 8 hours. Clinical Child psychotherapist (CSW) contacted Tiffany the Charity fundraiser at [employer] who reported that they cannot provide PICC line care. CSW contacted patient's daughter [name] and made her aware of above. CSW explained to [name] that patient will need to go to a SNF for IV Abx. [name] is agreeable to SNF search and prefers [facility].   Per [facility] admissions coordinator at [facility] they can accept patient and draw the blood cultures and get a PICC lined placed. Per Gala Romney patient can be admitted to [facility] from [place] today. Per Doug patient is going to room 502. CSW sent D/C Summary, FL2 and D/C Packet to [place] via HUB. Health Team authorization was received. Auth # [id number].   [facility] RN at [facility] is arranging transportation to [facility]. Patient's daughter [name] is aware of above and is coming to [facility] to be with patient for transfer.   Jetta Lout, LCSW [phone]

## 2015-02-14 NOTE — Care Management Note (Addendum)
Case Management Note  Patient Details  Name: Gina [name] MRN: [id number] Date of Birth: May 10, 1917  Subjective/Objective:      Discussed home health orders for RN, PT, and home health aid with Gina [name] daughter. Daughter, Gina [name], stated that she does not want home health for her Mom at this time but would request it from Gina [name] PCP if she decides that it is needed in the future.  No home health referral per daughters request. Gina [name] is returning to [facility] today.             Action/Plan:   Expected Discharge Date:                  Expected Discharge Plan:     In-House Referral:     Discharge planning Services     Post Acute Care Choice:    Choice offered to:     DME Arranged:    DME Agency:     HH Arranged:    HH Agency:     Status of Service:     Medicare Important Message Given:    Date Medicare IM Given:    Medicare IM give by:    Date Additional Medicare IM Given:    Additional Medicare Important Message give by:     If discussed at Long Length of Stay Meetings, dates discussed:    Additional Comments:  Carrah Eppolito A, RN 02/14/2015, 1:18 PM

## 2015-02-14 NOTE — Progress Notes (Signed)
[facility] is here to pick up patient.

## 2015-02-14 NOTE — Clinical Social Work Note (Signed)
Clinical Social Work Assessment  Patient Details  Name: Gina [name] MRN: [id number] Date of Birth: 08/22/1917  Date of referral:  02/14/15               Reason for consult:   (from [facility] Unit)                Permission sought to share information with:  Facility Medical sales representative, Family Supports Permission granted to share information::     Name::      (Daughter [name]  [phone])  Agency::     Relationship::     Contact Information:     Housing/Transportation Living arrangements for the past 2 months:  Assisted Living Facility Source of Information:  Adult Children, Medical Team, Facility Patient Interpreter Needed:  None Criminal Activity/Legal Involvement Pertinent to Current Situation/Hospitalization:    Significant Relationships:  Adult Children, Merchandiser, retail Lives with:  Facility Resident Do you feel safe going back to the place where you live?    Need for family participation in patient care:  Yes (Comment) (daughter [name])  Care giving concerns:  None at this time  Office manager / plan:  Visual merchandiser (CSW) consult for SNF placement, PT is recommending STR to assist with getting patient back to previous level of functioning.   Patient was alert, disoriented due to dementis (Additional Information provided by daughter and facility).  CSW introduced self and explained role of CSW department.   Patient lives at [facility] for the past 10 years.  Recently at memory care this past year.  Daughter [name] is her primary support. . CSW explained that PT is recommending rehab.  Patient has been to SNF.  Patient's daughter is not agreeable to SNF search. States patient is primary in a wheelchair at the facility and they assist her with her ADL.  Would like patient to return and do PT at [facility] if they will take her back.   Call to [facility], waiting on return call from Tiffany to determine if patient is able to return.   CSW will  complete FL2,  in anticipation of patient returning to [place] .   Employment status:    Database administrator PT Recommendations:  Skilled Nursing Facility Information / Referral to community resources:  Other (Comment Required) (none at this time family wants to return to ALF)  Patient/Family's Response to care:  Patient's daughter was appreciative of information provided by CSW.   Patient/Family's Understanding of and Emotional Response to Diagnosis, Current Treatment, and Prognosis:  Daughter informed that patient is medically stable and will discharge today.  Emotional Assessment Appearance:  Appears stated age Attitude/Demeanor/Rapport:    Affect (typically observed):  Unable to Assess Orientation:  Oriented to Self Alcohol / Substance use:    Psych involvement (Current and /or in the community):  No (Comment)  Discharge Needs  Concerns to be addressed:  Care Coordination, Discharge Planning Concerns Readmission within the last 30 days:  No Current discharge risk:  Chronically ill, Cognitively Impaired, Dependent with Mobility Barriers to Discharge:  Continued Medical Work up, No Barriers Identified   Soundra Pilon, LCSW 02/14/2015, 11:46 AM

## 2015-02-14 NOTE — Progress Notes (Signed)
Patient discharging to [facility]. Daughter at bedside. Instructions given to daughter, verbalized understanding. Dressing dry and intact on left hip. Waiting on transport from [facility].

## 2015-02-14 NOTE — Progress Notes (Signed)
Clinical Social Worker informed by Shaune Pollack, MD that patient is medically ready to discharge back to [facility], Patient's daughter [name] is in a agreement with plan.  Call to [facility] to inform them patient is ready to return.  Per Elmarie Shiley [phone] they are able to take patient back . All discharge information faxed to  Facility. Rx's added to discharge packet with DNR. Chip Boer will come to transport patient back to facility.  RN will provide report when patient is picked up.  Sammuel Hines. LCSWA Clinical Social Work Department [phone] 12:48 PM

## 2015-02-14 NOTE — Discharge Summary (Addendum)
[facility] [place] at [facility]   PATIENT NAME: Gina [name]    MR#:  [id number]  DATE OF BIRTH:  Nov 23, 1917  DATE OF ADMISSION:  02/11/2015 ADMITTING PHYSICIAN: Milagros Loll, MD  DATE OF DISCHARGE: 02/14/2015 PRIMARY CARE PHYSICIAN: No primary care provider on file.    ADMISSION DIAGNOSIS:  Wound infection (HCC) [T14.8, L08.9] Essential hypertension [I10] Type 2 diabetes mellitus without complication, without long-term current use of insulin (HCC) [E11.9]   DISCHARGE DIAGNOSIS:    SECONDARY DIAGNOSIS:   Past Medical History  Diagnosis Date  . Diabetes mellitus without complication (HCC)   . Hypertension   . Dementia   . Anxiety   . GERD (gastroesophageal reflux disease)   . Osteoporosis   . Hypothyroidism     HOSPITAL COURSE:   * Right lateral thigh necrotic ulcer with surrounding Cellulitis, s/p I&D,  She was treated with Zosyn and vancomycin, which were discontinued and changed to ancef iv since Wound culture: MSSA. Blood cultures is negative so far. Leukocytosis improved.  * Worsening chronic hyponatremia. Improved to baseline with normal saline IV, follow-up BMP as outpatient.  * Dehydration, improved with IV fluid support.  * Right hip fracture status post surgery but moved hardware  * Diabetes mellitus resume home metformin. Was on sliding scale insulin.  * Hypertension. Controlled with verapamil.  Dementia. Aspiration and fall precaution.  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 Wound care. No indication for surgery per surgical consult.  Hypomagnesemia. Mag iv. Follow up mag as outpatient. The patient's daughter wants her be discharged to ALF. DISCHARGE CONDITIONS:   Stable, discharge to ALF with HHPT today.  CONSULTS OBTAINED:  Treatment Team:  Christena Flake, MD Ricarda Frame, MD Lattie Haw, MD  DRUG ALLERGIES:   Allergies  Allergen Reactions  . Fosamax [Alendronate Sodium] Other (See Comments)    Reaction:  Unknown    DISCHARGE MEDICATIONS:   Current Discharge Medication List    START taking these medications   Details  cephALEXin (KEFLEX) 500 MG capsule Take 1 capsule (500 mg total) by mouth 4 (four) times daily. Qty: 48 capsule, Refills: 0      CONTINUE these medications which have CHANGED   Details  !! ALPRAZolam (XANAX) 0.25 MG tablet Take 1 tablet (0.25 mg total) by mouth 2 (two) times daily as needed for anxiety. Qty: 14 tablet, Refills: 0    !! ALPRAZolam (XANAX) 0.5 MG tablet Take 1 tablet (0.5 mg total) by mouth at bedtime. Qty: 10 tablet, Refills: 0    HYDROcodone-acetaminophen (NORCO/VICODIN) 5-325 MG tablet Take 1 tablet by mouth every 6 (six) hours as needed for moderate pain. Qty: 16 tablet, Refills: 0     !! - Potential duplicate medications found. Please discuss with provider.    CONTINUE these medications which have NOT CHANGED   Details  atorvastatin (LIPITOR) 10 MG tablet Take 10 mg by mouth daily at 6 PM.     calcium-vitamin D (OSCAL WITH D) 500-200 MG-UNIT tablet Take 1 tablet by mouth 2 (two) times daily. Qty: 60 tablet, Refills: 0    Cholecalciferol (VITAMIN D3) 50000 units CAPS Take 1 capsule by mouth once a week. Takes on Saturday    ferrous sulfate 325 (65 FE) MG tablet Take 1 tablet (325 mg total) by mouth 2 (two) times daily with a meal. Qty: 60 tablet, Refills: 3    levothyroxine (SYNTHROID, LEVOTHROID) 137 MCG tablet Take 137 mcg by mouth daily before breakfast.    loperamide (IMODIUM) 2 MG  capsule Take 2 mg by mouth every 4 (four) hours as needed for diarrhea or loose stools.     Menthol-Zinc Oxide (REMEDY CALAZIME) 0.2-20 % PSTE Apply 1 application topically as needed (skin irritation). Apply to excoriated areas on sacrum    metFORMIN (GLUCOPHAGE) 500 MG tablet Take 500 mg by mouth 2 (two) times daily with a meal.    nystatin cream (MYCOSTATIN) Apply 1 application topically as needed (rash). Apply to groin    omeprazole (PRILOSEC) 20 MG  capsule Take 20 mg by mouth daily.    ranitidine (ZANTAC) 150 MG tablet Take 150 mg by mouth at bedtime.    senna (SENOKOT) 8.6 MG TABS tablet Take 1 tablet (8.6 mg total) by mouth daily as needed for mild constipation. Qty: 30 each, Refills: 0    sodium chloride (OCEAN) 0.65 % SOLN nasal spray Place 2 sprays into both nostrils every 6 (six) hours as needed for congestion.     sodium chloride 1 G tablet Take 1 g by mouth 2 (two) times daily with a meal.    traZODone (DESYREL) 50 MG tablet Take 50 mg by mouth at bedtime.    verapamil (VERELAN PM) 240 MG 24 hr capsule Take 240 mg by mouth daily.     vitamin C (ASCORBIC ACID) 500 MG tablet Take 500 mg by mouth daily.      STOP taking these medications     cefUROXime (CEFTIN) 250 MG tablet          DISCHARGE INSTRUCTIONS:   If you experience worsening of your admission symptoms, develop shortness of breath, life threatening emergency, suicidal or homicidal thoughts you must seek medical attention immediately by calling 911 or calling your MD immediately  if symptoms less severe.  You Must read complete instructions/literature along with all the possible adverse reactions/side effects for all the Medicines you take and that have been prescribed to you. Take any new Medicines after you have completely understood and accept all the possible adverse reactions/side effects.   Please note  You were cared for by a hospitalist during your hospital stay. If you have any questions about your discharge medications or the care you received while you were in the hospital after you are discharged, you can call the unit and asked to speak with the hospitalist on call if the hospitalist that took care of you is not available. Once you are discharged, your primary care physician will handle any further medical issues. Please note that NO REFILLS for any discharge medications will be authorized once you are discharged, as it is imperative that you return  to your primary care physician (or establish a relationship with a primary care physician if you do not have one) for your aftercare needs so that they can reassess your need for medications and monitor your lab values.    Today   SUBJECTIVE   Weakness.   VITAL SIGNS:  Blood pressure 120/57, pulse 85, temperature 98.2 F (36.8 C), temperature source Oral, resp. rate 18, height 5\' 4"  (1.626 m), weight 71.753 kg (158 lb 3 oz), SpO2 97 %.  I/O:   Intake/Output Summary (Last 24 hours) at 02/14/15 1120 Last data filed at 02/14/15 0800  Gross per 24 hour  Intake 2008.75 ml  Output    775 ml  Net 1233.75 ml    PHYSICAL EXAMINATION:  GENERAL:  82 y.o.-year-old patient lying in the bed with no acute distress.  EYES: Pupils equal, round, reactive to light and accommodation. No scleral  icterus. Extraocular muscles intact.  HEENT: Head atraumatic, normocephalic. Oropharynx and nasopharynx clear.  NECK:  Supple, no jugular venous distention. No thyroid enlargement, no tenderness.  LUNGS: Normal breath sounds bilaterally, no wheezing, rales,rhonchi or crepitation. No use of accessory muscles of respiration.  CARDIOVASCULAR: S1, S2 normal. No murmurs, rubs, or gallops.  ABDOMEN: Soft, non-tender, non-distended. Bowel sounds present. No organomegaly or mass.  EXTREMITIES: No pedal edema, cyanosis, or clubbing. Right thigh in dressing. NEUROLOGIC: unable to exam. PSYCHIATRIC: The patient is demented. SKIN: No obvious rash, lesion. 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   DATA REVIEW:   CBC  Recent Labs Lab 02/14/15 0509  WBC 12.8*  HGB 9.6*  HCT 28.1*  PLT 198    Chemistries   Recent Labs Lab 02/14/15 0509  NA 126*  K 4.5  CL 94*  CO2 29  GLUCOSE 105*  BUN 18  CREATININE 0.87  CALCIUM 8.7*  MG 1.2*    Cardiac Enzymes No results for input(s): TROPONINI in the last 168 hours.  Microbiology Results  Results for orders placed or performed during the hospital encounter of 02/11/15   Culture, blood (routine x 2)     Status: None (Preliminary result)   Collection Time: 02/12/15 12:31 AM  Result Value Ref Range Status   Specimen Description BLOOD RIGHT ASSIST CONTROL  Final   Special Requests BOTTLES DRAWN AEROBIC AND ANAEROBIC 1CCAERO,1CCANA  Final   Culture  Setup Time   Final    GRAM POSITIVE COCCI IN BOTH AEROBIC AND ANAEROBIC BOTTLES CRITICAL RESULT CALLED TO, READ BACK BY AND VERIFIED WITH: LISA KLUTTZ 02/12/2015 1545 BY JRS.    Culture   Final    STAPHYLOCOCCUS AUREUS IN BOTH AEROBIC AND ANAEROBIC BOTTLES    Report Status PENDING  Incomplete   Organism ID, Bacteria STAPHYLOCOCCUS AUREUS  Final      Susceptibility   Staphylococcus aureus - MIC*    CIPROFLOXACIN 4 RESISTANT Resistant     ERYTHROMYCIN >=8 RESISTANT Resistant     GENTAMICIN <=0.5 SENSITIVE Sensitive     OXACILLIN 0.5 SENSITIVE Sensitive     TETRACYCLINE <=1 SENSITIVE Sensitive     VANCOMYCIN 1 SENSITIVE Sensitive     TRIMETH/SULFA <=10 SENSITIVE Sensitive     CLINDAMYCIN <=0.25 SENSITIVE Sensitive     RIFAMPIN <=0.5 SENSITIVE Sensitive     Inducible Clindamycin NEGATIVE Sensitive     * STAPHYLOCOCCUS AUREUS  Blood Culture ID Panel (Reflexed)     Status: Abnormal   Collection Time: 02/12/15 12:31 AM  Result Value Ref Range Status   Enterococcus species NOT DETECTED NOT DETECTED Final   Listeria monocytogenes NOT DETECTED NOT DETECTED Final   Staphylococcus species DETECTED (A) NOT DETECTED Final    Comment: CRITICAL RESULT CALLED TO, READ BACK BY AND VERIFIED WITH: LISA KLUTTZ 02/12/2015 1545 BY JRS.    Staphylococcus aureus DETECTED (A) NOT DETECTED Final    Comment: CRITICAL RESULT CALLED TO, READ BACK BY AND VERIFIED WITH: LISA KLUTTZ 02/12/2015 1545 BY JRS. DETECTED    Streptococcus species NOT DETECTED NOT DETECTED Final   Streptococcus agalactiae NOT DETECTED NOT DETECTED Final   Streptococcus pneumoniae NOT DETECTED NOT DETECTED Final   Streptococcus pyogenes NOT DETECTED NOT  DETECTED Final   Acinetobacter baumannii NOT DETECTED NOT DETECTED Final   Enterobacteriaceae species NOT DETECTED NOT DETECTED Final   Enterobacter cloacae complex NOT DETECTED NOT DETECTED Final   Escherichia coli NOT DETECTED NOT DETECTED Final   Klebsiella oxytoca NOT DETECTED NOT DETECTED Final   Klebsiella  pneumoniae NOT DETECTED NOT DETECTED Final   Proteus species NOT DETECTED NOT DETECTED Final   Serratia marcescens NOT DETECTED NOT DETECTED Final   Haemophilus influenzae NOT DETECTED NOT DETECTED Final   Neisseria meningitidis NOT DETECTED NOT DETECTED Final   Pseudomonas aeruginosa NOT DETECTED NOT DETECTED Final   Candida albicans NOT DETECTED NOT DETECTED Final   Candida glabrata NOT DETECTED NOT DETECTED Final   Candida krusei NOT DETECTED NOT DETECTED Final   Candida parapsilosis NOT DETECTED NOT DETECTED Final   Candida tropicalis NOT DETECTED NOT DETECTED Final   Carbapenem resistance NOT DETECTED NOT DETECTED Final   Methicillin resistance NOT DETECTED NOT DETECTED Final   Vancomycin resistance NOT DETECTED NOT DETECTED Final  Culture, blood (routine x 2)     Status: None (Preliminary result)   Collection Time: 02/12/15 12:36 AM  Result Value Ref Range Status   Specimen Description BLOOD LEFT ASSIST CONTROL  Final   Special Requests BOTTLES DRAWN AEROBIC AND ANAEROBIC 1CCAERO,1CCANA  Final   Culture NO GROWTH 2 DAYS  Final   Report Status PENDING  Incomplete  Wound culture     Status: None (Preliminary result)   Collection Time: 02/12/15 12:46 AM  Result Value Ref Range Status   Specimen Description THIGH  Final   Special Requests Normal  Final   Gram Stain   Final    RARE WBC SEEN MODERATE GRAM POSITIVE COCCI IN CLUSTERS FEW GRAM NEGATIVE RODS    Culture   Final    HEAVY GROWTH STAPHYLOCOCCUS AUREUS SUSCEPTIBILITIES TO FOLLOW    Report Status PENDING  Incomplete  MRSA PCR Screening     Status: None   Collection Time: 02/12/15  5:38 AM  Result Value Ref  Range Status   MRSA by PCR NEGATIVE NEGATIVE Final    Comment:        The GeneXpert MRSA Assay (FDA approved for NASAL specimens only), is one component of a comprehensive MRSA colonization surveillance program. It is not intended to diagnose MRSA infection nor to guide or monitor treatment for MRSA infections.   Anaerobic culture     Status: None (Preliminary result)   Collection Time: 02/12/15 12:16 PM  Result Value Ref Range Status   Specimen Description WOUND  Final   Special Requests NONE  Final   Culture NO ANAEROBES ISOLATED  Final   Report Status PENDING  Incomplete  Wound culture     Status: None (Preliminary result)   Collection Time: 02/12/15 12:16 PM  Result Value Ref Range Status   Specimen Description WOUND  Final   Special Requests NONE  Final   Gram Stain   Final    FEW WBC SEEN MODERATE GRAM POSITIVE COCCI IN CLUSTERS    Culture   Final    HEAVY GROWTH STAPHYLOCOCCUS AUREUS SUSCEPTIBILITIES TO FOLLOW    Report Status PENDING  Incomplete    RADIOLOGY:  No results found.      Management plans discussed with the patient, family and they are in agreement.  CODE STATUS:     Code Status Orders        Start     Ordered   02/12/15 0336  Do not attempt resuscitation (DNR)   Continuous    Question Answer Comment  In the event of cardiac or respiratory ARREST Do not call a "code blue"   In the event of cardiac or respiratory ARREST Do not perform Intubation, CPR, defibrillation or ACLS   In the event of cardiac or respiratory  ARREST Use medication by any route, position, wound care, and other measures to relive pain and suffering. May use oxygen, suction and manual treatment of airway obstruction as needed for comfort.      02/12/15 0336    Code Status History    Date Active Date Inactive Code Status Order ID Comments User Context   10/21/2014  1:26 PM 10/25/2014  3:38 PM DNR [id number]  Deeann Saint, MD Inpatient   10/20/2014  6:43 PM 10/21/2014   1:26 PM DNR [id number]  Ramonita Lab, MD Inpatient   10/20/2014  3:17 PM 10/20/2014  6:43 PM Full Code [id number]  Deeann Saint, MD Inpatient      TOTAL TIME TAKING CARE OF THIS PATIENT: 36 minutes.    Shaune Pollack M.D on 02/14/2015 at 11:20 AM  Between 7am to 6pm - Pager - [phone]  After 6pm go to [email or web address] - password [email or web address] [facility]  [facility]  Office  [phone]  CC: Primary care physician; No primary care provider on file.

## 2015-02-14 NOTE — [place] FL2 (Signed)
[place] MEDICAID FL2 LEVEL OF CARE SCREENING TOOL     IDENTIFICATION  Patient Name: Gina [name] Birthdate: August 12, 1917 Sex: female Admission Date (Current Location): 02/11/2015  [place] and [place] Number:  [address] [id number] Q Facility and Address:  [facility], [address], [place], [place] [address]      Provider Number: [id number]  Attending Physician Name and Address:  No att. providers found  Relative Name and Phone Number:       Current Level of Care: Hospital Recommended Level of Care: Skilled Nursing Facility  Prior Approval Number:    Date Approved/Denied:   PASRR Number:   [id number]   Discharge Plan: SNF     Current Diagnoses: Patient Active Problem List   Diagnosis Date Noted  . Cellulitis 02/12/2015  . Hip fracture (HCC) 10/20/2014  . Pressure ulcer 10/20/2014  . HYPOTHYROIDISM 04/10/2006  . DIABETES MELLITUS, WITH GASTROPARESIS 04/10/2006  . HYPERCHOLESTEROLEMIA 04/10/2006  . DEMENTIA 04/10/2006  . ANXIETY 04/10/2006  . Essential hypertension 04/10/2006  . ATHEROSCLEROTIC CARDIOVASCULAR DISEASE 04/10/2006  . GERD 04/10/2006  . CONSTIPATION 04/10/2006  . DIABETES MELLITUS, TYPE II 05/09/2005    Orientation RESPIRATION BLADDER Height & Weight     Self  Normal Incontinent Weight: 158 lb 3 oz (71.753 kg) Height:  5\' 4"  (162.6 cm)  BEHAVIORAL SYMPTOMS/MOOD NEUROLOGICAL BOWEL NUTRITION STATUS      Incontinent Diet (no salt added, heart healthy)  AMBULATORY STATUS COMMUNICATION OF NEEDS Skin   Extensive Assist Verbally PU Stage and Appropriate Care                       Personal Care Assistance Level of Assistance  Bathing, Feeding, Dressing Bathing Assistance: Maximum assistance Feeding assistance: Limited assistance Dressing Assistance: Maximum assistance     Functional Limitations Info  Sight, Hearing, Speech Sight Info: Adequate Hearing Info: Adequate Speech Info: Adequate    SPECIAL CARE  FACTORS FREQUENCY  PT (By licensed PT)     5 times per week.  IV Abx.                Contractures      Additional Factors Info  Code Status, Allergies Code Status Info: DNR Allergies Info: Fosamax           Current Medications (02/14/2015):  This is the current hospital active medication list Current Facility-Administered Medications  Medication Dose Route Frequency Provider Last Rate Last Dose  . acetaminophen (TYLENOL) tablet 650 mg  650 mg Oral Q6H PRN Christena Flake, MD       Or  . acetaminophen (TYLENOL) suppository 650 mg  650 mg Rectal Q6H PRN Christena Flake, MD      . ALPRAZolam Prudy Feeler) tablet 0.25 mg  0.25 mg Oral BID PRN Milagros Loll, MD   0.25 mg at 02/14/15 0908  . ALPRAZolam Prudy Feeler) tablet 0.5 mg  0.5 mg Oral QHS Srikar Sudini, MD   0.5 mg at 02/13/15 2234  . atorvastatin (LIPITOR) tablet 10 mg  10 mg Oral q1800 Milagros Loll, MD   10 mg at 02/13/15 1621  . bisacodyl (DULCOLAX) EC tablet 5 mg  5 mg Oral Daily PRN Milagros Loll, MD      . bisacodyl (DULCOLAX) suppository 10 mg  10 mg Rectal Daily PRN Christena Flake, MD      . ceFAZolin (ANCEF) IVPB 2 g/50 mL premix  2 g Intravenous 3 times per day Shaune Pollack, MD   2 g at 02/14/15 0516  .  diphenhydrAMINE (BENADRYL) 12.5 MG/5ML elixir 12.5-25 mg  12.5-25 mg Oral Q4H PRN Christena Flake, MD      . docusate sodium (COLACE) capsule 100 mg  100 mg Oral BID Milagros Loll, MD   100 mg at 02/14/15 0859  . enoxaparin (LOVENOX) injection 30 mg  30 mg Subcutaneous Q24H Christena Flake, MD   30 mg at 02/14/15 0859  . famotidine (PEPCID) tablet 20 mg  20 mg Oral QHS Milagros Loll, MD   20 mg at 02/13/15 2234  . feeding supplement (ENSURE ENLIVE) (ENSURE ENLIVE) liquid 237 mL  237 mL Oral TID WC Srikar Sudini, MD   237 mL at 02/14/15 1133  . ferrous sulfate tablet 325 mg  325 mg Oral BID WC Milagros Loll, MD   325 mg at 02/14/15 0859  . HYDROmorphone (DILAUDID) injection 0.25-0.5 mg  0.25-0.5 mg Intravenous Q2H PRN Christena Flake, MD      .  insulin aspart (novoLOG) injection 0-5 Units  0-5 Units Subcutaneous QHS Srikar Sudini, MD      . insulin aspart (novoLOG) injection 0-9 Units  0-9 Units Subcutaneous TID WC Milagros Loll, MD   2 Units at 02/14/15 1133  . levofloxacin (LEVAQUIN) IVPB 750 mg  750 mg Intravenous Q48H Aruna Gouru, MD   750 mg at 02/13/15 1901  . levothyroxine (SYNTHROID, LEVOTHROID) tablet 137 mcg  137 mcg Oral QAC breakfast Milagros Loll, MD   137 mcg at 02/13/15 0819  . magnesium hydroxide (MILK OF MAGNESIA) suspension 30 mL  30 mL Oral Daily PRN Christena Flake, MD   30 mL at 02/14/15 1040  . metFORMIN (GLUCOPHAGE) tablet 500 mg  500 mg Oral BID WC Milagros Loll, MD   500 mg at 02/14/15 0859  . metoCLOPramide (REGLAN) tablet 5-10 mg  5-10 mg Oral Q8H PRN Christena Flake, MD       Or  . metoCLOPramide (REGLAN) injection 5-10 mg  5-10 mg Intravenous Q8H PRN Christena Flake, MD      . ondansetron [facility]) tablet 4 mg  4 mg Oral Q6H PRN Christena Flake, MD   4 mg at 02/13/15 1229   Or  . ondansetron (ZOFRAN) injection 4 mg  4 mg Intravenous Q6H PRN Christena Flake, MD      . oxyCODONE (Oxy IR/ROXICODONE) immediate release tablet 5 mg  5 mg Oral Q4H PRN Milagros Loll, MD   5 mg at 02/13/15 1602  . polyethylene glycol (MIRALAX / GLYCOLAX) packet 17 g  17 g Oral Daily PRN Srikar Sudini, MD      . sodium chloride tablet 1 g  1 g Oral BID WC Milagros Loll, MD   1 g at 02/14/15 0859  . sodium phosphate (FLEET) 7-19 GM/118ML enema 1 enema  1 enema Rectal Once PRN Christena Flake, MD      . traZODone (DESYREL) tablet 50 mg  50 mg Oral QHS Milagros Loll, MD   50 mg at 02/13/15 2234  . verapamil (CALAN-SR) CR tablet 180 mg  180 mg Oral Daily Milagros Loll, MD   180 mg at 02/14/15 1021   Current Outpatient Prescriptions  Medication Sig Dispense Refill  . atorvastatin (LIPITOR) 10 MG tablet Take 10 mg by mouth daily at 6 PM.     . calcium-vitamin D (OSCAL WITH D) 500-200 MG-UNIT tablet Take 1 tablet by mouth 2 (two) times daily. 60 tablet 0  .  Cholecalciferol (VITAMIN D3) 50000 units CAPS Take 1 capsule by mouth  once a week. Takes on Saturday    . ferrous sulfate 325 (65 FE) MG tablet Take 1 tablet (325 mg total) by mouth 2 (two) times daily with a meal. 60 tablet 3  . levothyroxine (SYNTHROID, LEVOTHROID) 137 MCG tablet Take 137 mcg by mouth daily before breakfast.    . loperamide (IMODIUM) 2 MG capsule Take 2 mg by mouth every 4 (four) hours as needed for diarrhea or loose stools.     . Menthol-Zinc Oxide (REMEDY CALAZIME) 0.2-20 % PSTE Apply 1 application topically as needed (skin irritation). Apply to excoriated areas on sacrum    . metFORMIN (GLUCOPHAGE) 500 MG tablet Take 500 mg by mouth 2 (two) times daily with a meal.    . nystatin cream (MYCOSTATIN) Apply 1 application topically as needed (rash). Apply to groin    . omeprazole (PRILOSEC) 20 MG capsule Take 20 mg by mouth daily.    . ranitidine (ZANTAC) 150 MG tablet Take 150 mg by mouth at bedtime.    . senna (SENOKOT) 8.6 MG TABS tablet Take 1 tablet (8.6 mg total) by mouth daily as needed for mild constipation. 30 each 0  . sodium chloride (OCEAN) 0.65 % SOLN nasal spray Place 2 sprays into both nostrils every 6 (six) hours as needed for congestion.     . sodium chloride 1 G tablet Take 1 g by mouth 2 (two) times daily with a meal.    . traZODone (DESYREL) 50 MG tablet Take 50 mg by mouth at bedtime.    . verapamil (VERELAN PM) 240 MG 24 hr capsule Take 240 mg by mouth daily.     . vitamin C (ASCORBIC ACID) 500 MG tablet Take 500 mg by mouth daily.    Marland Kitchen ALPRAZolam (XANAX) 0.25 MG tablet Take 1 tablet (0.25 mg total) by mouth 2 (two) times daily as needed for anxiety. 14 tablet 0  . ALPRAZolam (XANAX) 0.5 MG tablet Take 1 tablet (0.5 mg total) by mouth at bedtime. 10 tablet 0  . cephALEXin (KEFLEX) 500 MG capsule Take 1 capsule (500 mg total) by mouth 4 (four) times daily. 48 capsule 0  . HYDROcodone-acetaminophen (NORCO/VICODIN) 5-325 MG tablet Take 1 tablet by mouth every 6  (six) hours as needed for moderate pain. 16 tablet 0     Discharge Medications: Please see discharge summary for a list of discharge medications.  Relevant Imaging Results:  Relevant Lab Results:   Additional Information [id number]  Haig Prophet, LCSW

## 2015-02-14 NOTE — NC FL2 (Signed)
[place] MEDICAID FL2 LEVEL OF CARE SCREENING TOOL     IDENTIFICATION  Patient Name: Gina [name] Birthdate: 10-25-1917 Sex: female Admission Date (Current Location): 02/11/2015  [place] and [place] Number:  [address] [id number] Q Facility and Address:  [facility], [address] [place], [place] [address]      Provider Number: [id number]  Attending Physician Name and Address:  Shaune Pollack, MD  Relative Name and Phone Number:       Current Level of Care: Hospital Recommended Level of Care: Assisted Living Facility, Memory Care Prior Approval Number:    Date Approved/Denied:   PASRR Number:    Discharge Plan: Domiciliary (Rest home)    Current Diagnoses: Patient Active Problem List   Diagnosis Date Noted  . Cellulitis 02/12/2015  . Hip fracture (HCC) 10/20/2014  . Pressure ulcer 10/20/2014  . HYPOTHYROIDISM 04/10/2006  . DIABETES MELLITUS, WITH GASTROPARESIS 04/10/2006  . HYPERCHOLESTEROLEMIA 04/10/2006  . DEMENTIA 04/10/2006  . ANXIETY 04/10/2006  . Essential hypertension 04/10/2006  . ATHEROSCLEROTIC CARDIOVASCULAR DISEASE 04/10/2006  . GERD 04/10/2006  . CONSTIPATION 04/10/2006  . DIABETES MELLITUS, TYPE II 05/09/2005    Orientation RESPIRATION BLADDER Height & Weight     Self  Normal Incontinent Weight: 158 lb 3 oz (71.753 kg) Height:   (162.6 cm)  BEHAVIORAL SYMPTOMS/MOOD NEUROLOGICAL BOWEL NUTRITION STATUS      Incontinent Diet (no salt added, heart healthy)  AMBULATORY STATUS COMMUNICATION OF NEEDS Skin   Extensive Assist Verbally PU Stage and Appropriate Care                       Personal Care Assistance Level of Assistance  Bathing, Feeding, Dressing Bathing Assistance: Maximum assistance Feeding assistance: Limited assistance Dressing Assistance: Maximum assistance     Functional Limitations Info  Sight, Hearing, Speech Sight Info: Adequate Hearing Info: Adequate Speech Info: Adequate    SPECIAL  CARE FACTORS FREQUENCY  PT (By licensed PT)     PT Frequency: 2 /3 times a week              Contractures      Additional Factors Info  Code Status, Allergies Code Status Info: DNR Allergies Info: Fosamax           Current Medications (02/14/2015):  This is the current hospital active medication list Current Facility-Administered Medications  Medication Dose Route Frequency Provider Last Rate Last Dose  . acetaminophen (TYLENOL) tablet 650 mg  650 mg Oral Q6H PRN Christena Flake, MD       Or  . acetaminophen (TYLENOL) suppository 650 mg  650 mg Rectal Q6H PRN Christena Flake, MD      . ALPRAZolam Prudy Feeler) tablet 0.25 mg  0.25 mg Oral BID PRN Milagros Loll, MD   0.25 mg at 02/14/15 0908  . ALPRAZolam Prudy Feeler) tablet 0.5 mg  0.5 mg Oral QHS Srikar Sudini, MD   0.5 mg at 02/13/15 2234  . atorvastatin (LIPITOR) tablet 10 mg  10 mg Oral q1800 Milagros Loll, MD   10 mg at 02/13/15 1621  . bisacodyl (DULCOLAX) EC tablet 5 mg  5 mg Oral Daily PRN Milagros Loll, MD      . bisacodyl (DULCOLAX) suppository 10 mg  10 mg Rectal Daily PRN Christena Flake, MD      . ceFAZolin (ANCEF) IVPB 2 g/50 mL premix  2 g Intravenous 3 times per day Shaune Pollack, MD   2 g at 02/14/15 0516  . diphenhydrAMINE (  BENADRYL) 12.5 MG/5ML elixir 12.5-25 mg  12.5-25 mg Oral Q4H PRN Christena Flake, MD      . docusate sodium (COLACE) capsule 100 mg  100 mg Oral BID Milagros Loll, MD   100 mg at 02/14/15 0859  . enoxaparin (LOVENOX) injection 30 mg  30 mg Subcutaneous Q24H Christena Flake, MD   30 mg at 02/14/15 0859  . famotidine (PEPCID) tablet 20 mg  20 mg Oral QHS Milagros Loll, MD   20 mg at 02/13/15 2234  . feeding supplement (ENSURE ENLIVE) (ENSURE ENLIVE) liquid 237 mL  237 mL Oral TID WC Srikar Sudini, MD   237 mL at 02/14/15 1133  . ferrous sulfate tablet 325 mg  325 mg Oral BID WC Milagros Loll, MD   325 mg at 02/14/15 0859  . HYDROmorphone (DILAUDID) injection 0.25-0.5 mg  0.25-0.5 mg Intravenous Q2H PRN Christena Flake, MD       . insulin aspart (novoLOG) injection 0-5 Units  0-5 Units Subcutaneous QHS Srikar Sudini, MD      . insulin aspart (novoLOG) injection 0-9 Units  0-9 Units Subcutaneous TID WC Milagros Loll, MD   2 Units at 02/14/15 1133  . levofloxacin (LEVAQUIN) IVPB 750 mg  750 mg Intravenous Q48H Aruna Gouru, MD   750 mg at 02/13/15 1901  . levothyroxine (SYNTHROID, LEVOTHROID) tablet 137 mcg  137 mcg Oral QAC breakfast Milagros Loll, MD   137 mcg at 02/13/15 0819  . magnesium hydroxide (MILK OF MAGNESIA) suspension 30 mL  30 mL Oral Daily PRN Christena Flake, MD   30 mL at 02/14/15 1040  . magnesium sulfate IVPB 4 g 100 mL  4 g Intravenous Once Shaune Pollack, MD   4 g at 02/14/15 1151  . metFORMIN (GLUCOPHAGE) tablet 500 mg  500 mg Oral BID WC Milagros Loll, MD   500 mg at 02/14/15 0859  . metoCLOPramide (REGLAN) tablet 5-10 mg  5-10 mg Oral Q8H PRN Christena Flake, MD       Or  . metoCLOPramide (REGLAN) injection 5-10 mg  5-10 mg Intravenous Q8H PRN Christena Flake, MD      . ondansetron [facility]) tablet 4 mg  4 mg Oral Q6H PRN Christena Flake, MD   4 mg at 02/13/15 1229   Or  . ondansetron (ZOFRAN) injection 4 mg  4 mg Intravenous Q6H PRN Christena Flake, MD      . oxyCODONE (Oxy IR/ROXICODONE) immediate release tablet 5 mg  5 mg Oral Q4H PRN Milagros Loll, MD   5 mg at 02/13/15 1602  . polyethylene glycol (MIRALAX / GLYCOLAX) packet 17 g  17 g Oral Daily PRN Srikar Sudini, MD      . sodium chloride tablet 1 g  1 g Oral BID WC Milagros Loll, MD   1 g at 02/14/15 0859  . sodium phosphate (FLEET) 7-19 GM/118ML enema 1 enema  1 enema Rectal Once PRN Christena Flake, MD      . traZODone (DESYREL) tablet 50 mg  50 mg Oral QHS Milagros Loll, MD   50 mg at 02/13/15 2234  . verapamil (CALAN-SR) CR tablet 180 mg  180 mg Oral Daily Milagros Loll, MD   180 mg at 02/14/15 1021     Discharge Medications: Current Discharge Medication List    START taking these medications   Details  cephALEXin (KEFLEX) 500 MG capsule Take 1  capsule (500 mg total) by mouth 4 (four) times daily. Qty: 48 capsule,  Refills: 0      CONTINUE these medications which have CHANGED   Details  !! ALPRAZolam (XANAX) 0.25 MG tablet Take 1 tablet (0.25 mg total) by mouth 2 (two) times daily as needed for anxiety. Qty: 14 tablet, Refills: 0    !! ALPRAZolam (XANAX) 0.5 MG tablet Take 1 tablet (0.5 mg total) by mouth at bedtime. Qty: 10 tablet, Refills: 0    HYDROcodone-acetaminophen (NORCO/VICODIN) 5-325 MG tablet Take 1 tablet by mouth every 6 (six) hours as needed for moderate pain. Qty: 16 tablet, Refills: 0    !! - Potential duplicate medications found. Please discuss with provider.    CONTINUE these medications which have NOT CHANGED   Details  atorvastatin (LIPITOR) 10 MG tablet Take 10 mg by mouth daily at 6 PM.     calcium-vitamin D (OSCAL WITH D) 500-200 MG-UNIT tablet Take 1 tablet by mouth 2 (two) times daily. Qty: 60 tablet, Refills: 0    Cholecalciferol (VITAMIN D3) 50000 units CAPS Take 1 capsule by mouth once a week. Takes on Saturday    ferrous sulfate 325 (65 FE) MG tablet Take 1 tablet (325 mg total) by mouth 2 (two) times daily with a meal. Qty: 60 tablet, Refills: 3    levothyroxine (SYNTHROID, LEVOTHROID) 137 MCG tablet Take 137 mcg by mouth daily before breakfast.    loperamide (IMODIUM) 2 MG capsule Take 2 mg by mouth every 4 (four) hours as needed for diarrhea or loose stools.     Menthol-Zinc Oxide (REMEDY CALAZIME) 0.2-20 % PSTE Apply 1 application topically as needed (skin irritation). Apply to excoriated areas on sacrum    metFORMIN (GLUCOPHAGE) 500 MG tablet Take 500 mg by mouth 2 (two) times daily with a meal.    nystatin cream (MYCOSTATIN) Apply 1 application topically as needed (rash). Apply to groin    omeprazole (PRILOSEC) 20 MG capsule Take 20 mg by mouth daily.    ranitidine (ZANTAC) 150 MG tablet Take 150 mg by mouth at bedtime.     senna (SENOKOT) 8.6 MG TABS tablet Take 1 tablet (8.6 mg total) by mouth daily as needed for mild constipation. Qty: 30 each, Refills: 0    sodium chloride (OCEAN) 0.65 % SOLN nasal spray Place 2 sprays into both nostrils every 6 (six) hours as needed for congestion.     sodium chloride 1 G tablet Take 1 g by mouth 2 (two) times daily with a meal.    traZODone (DESYREL) 50 MG tablet Take 50 mg by mouth at bedtime.    verapamil (VERELAN PM) 240 MG 24 hr capsule Take 240 mg by mouth daily.     vitamin C (ASCORBIC ACID) 500 MG tablet Take 500 mg by mouth daily.      STOP taking these medications     cefUROXime (CEFTIN) 250 MG tablet              Relevant Imaging Results:  Relevant Lab Results:   Additional Information [id number]   Home Health PT  [place], [place]

## 2015-02-15 ENCOUNTER — Telehealth: Payer: Self-pay | Admitting: Infectious Diseases

## 2015-02-15 LAB — WOUND CULTURE: SPECIAL REQUESTS: NORMAL

## 2015-02-15 LAB — SURGICAL PATHOLOGY

## 2015-02-15 MED ORDER — CEFTRIAXONE SODIUM 1 G IJ SOLR: 2.0000 g | INTRAMUSCULAR | Status: DC

## 2015-02-15 NOTE — Telephone Encounter (Signed)
Notified by [facility] that bcx + MSSA.  Pt already discharged. Yesterday spoke with Dr Imogene Burn and Fredric Mare in SW. Pt is being admitted to [facility]  Orders Draw blood culture on admit If bcx negative at 48 hours place picc line Start IM ceftraixone 2 gm IM q 24 hours (If peripheral IV can be placed temporailly can be given IV) Once Picc place start ancef 2 gm IV q 8 hours for 2 weeks from negative blood culture date

## 2015-02-15 NOTE — Progress Notes (Signed)
Clinical Child psychotherapist (CSW) sent Dr. Jarrett Ables note with medication orders to [facility] today. CSW spoke with [facility] admissions coordinator at [facility] and made her aware of above.   Jetta Lout, LCSW [phone]

## 2015-02-16 LAB — ANAEROBIC CULTURE

## 2015-02-16 LAB — URINE CULTURE: Culture: 20000

## 2015-02-17 ENCOUNTER — Emergency Department: Payer: PPO

## 2015-02-17 ENCOUNTER — Encounter: Payer: Self-pay | Admitting: Radiology

## 2015-02-17 ENCOUNTER — Inpatient Hospital Stay
Admission: EM | Admit: 2015-02-17 | Discharge: 2015-02-23 | DRG: 394 | Disposition: A | Discharge status: Skilled Nursing Facility | Payer: PPO | Attending: Internal Medicine | Admitting: Internal Medicine

## 2015-02-17 DIAGNOSIS — E1122 Type 2 diabetes mellitus with diabetic chronic kidney disease: Secondary | ICD-10-CM | POA: Diagnosis not present

## 2015-02-17 DIAGNOSIS — L089 Local infection of the skin and subcutaneous tissue, unspecified: Secondary | ICD-10-CM

## 2015-02-17 DIAGNOSIS — E0781 Sick-euthyroid syndrome: Secondary | ICD-10-CM | POA: Diagnosis not present

## 2015-02-17 DIAGNOSIS — Z1623 Resistance to quinolones and fluoroquinolones: Secondary | ICD-10-CM | POA: Diagnosis present

## 2015-02-17 DIAGNOSIS — A419 Sepsis, unspecified organism: Secondary | ICD-10-CM | POA: Diagnosis not present

## 2015-02-17 DIAGNOSIS — R001 Bradycardia, unspecified: Secondary | ICD-10-CM | POA: Diagnosis not present

## 2015-02-17 DIAGNOSIS — Z792 Long term (current) use of antibiotics: Secondary | ICD-10-CM

## 2015-02-17 DIAGNOSIS — B369 Superficial mycosis, unspecified: Secondary | ICD-10-CM | POA: Diagnosis not present

## 2015-02-17 DIAGNOSIS — G4700 Insomnia, unspecified: Secondary | ICD-10-CM | POA: Diagnosis not present

## 2015-02-17 DIAGNOSIS — L89151 Pressure ulcer of sacral region, stage 1: Secondary | ICD-10-CM | POA: Diagnosis not present

## 2015-02-17 DIAGNOSIS — B9561 Methicillin susceptible Staphylococcus aureus infection as the cause of diseases classified elsewhere: Secondary | ICD-10-CM | POA: Diagnosis present

## 2015-02-17 DIAGNOSIS — R05 Cough: Secondary | ICD-10-CM

## 2015-02-17 DIAGNOSIS — E876 Hypokalemia: Secondary | ICD-10-CM | POA: Diagnosis present

## 2015-02-17 DIAGNOSIS — E039 Hypothyroidism, unspecified: Secondary | ICD-10-CM | POA: Diagnosis not present

## 2015-02-17 DIAGNOSIS — K604 Rectal fistula: Secondary | ICD-10-CM | POA: Diagnosis not present

## 2015-02-17 DIAGNOSIS — K632 Fistula of intestine: Secondary | ICD-10-CM | POA: Diagnosis not present

## 2015-02-17 DIAGNOSIS — R32 Unspecified urinary incontinence: Secondary | ICD-10-CM

## 2015-02-17 DIAGNOSIS — E1069 Type 1 diabetes mellitus with other specified complication: Secondary | ICD-10-CM

## 2015-02-17 DIAGNOSIS — R7881 Bacteremia: Secondary | ICD-10-CM | POA: Diagnosis not present

## 2015-02-17 DIAGNOSIS — E871 Hypo-osmolality and hyponatremia: Secondary | ICD-10-CM | POA: Diagnosis not present

## 2015-02-17 DIAGNOSIS — Z7984 Long term (current) use of oral hypoglycemic drugs: Secondary | ICD-10-CM

## 2015-02-17 DIAGNOSIS — Z833 Family history of diabetes mellitus: Secondary | ICD-10-CM

## 2015-02-17 DIAGNOSIS — L89310 Pressure ulcer of right buttock, unstageable: Secondary | ICD-10-CM | POA: Diagnosis not present

## 2015-02-17 DIAGNOSIS — Z66 Do not resuscitate: Secondary | ICD-10-CM | POA: Diagnosis present

## 2015-02-17 DIAGNOSIS — L89300 Pressure ulcer of unspecified buttock, unstageable: Secondary | ICD-10-CM

## 2015-02-17 DIAGNOSIS — I10 Essential (primary) hypertension: Secondary | ICD-10-CM | POA: Diagnosis present

## 2015-02-17 DIAGNOSIS — R1310 Dysphagia, unspecified: Secondary | ICD-10-CM | POA: Diagnosis present

## 2015-02-17 DIAGNOSIS — K219 Gastro-esophageal reflux disease without esophagitis: Secondary | ICD-10-CM | POA: Diagnosis not present

## 2015-02-17 DIAGNOSIS — E7800 Pure hypercholesterolemia, unspecified: Secondary | ICD-10-CM | POA: Diagnosis not present

## 2015-02-17 DIAGNOSIS — B373 Candidiasis of vulva and vagina: Secondary | ICD-10-CM | POA: Diagnosis present

## 2015-02-17 DIAGNOSIS — K7460 Unspecified cirrhosis of liver: Secondary | ICD-10-CM | POA: Diagnosis not present

## 2015-02-17 DIAGNOSIS — L03818 Cellulitis of other sites: Secondary | ICD-10-CM | POA: Diagnosis not present

## 2015-02-17 DIAGNOSIS — M810 Age-related osteoporosis without current pathological fracture: Secondary | ICD-10-CM | POA: Diagnosis present

## 2015-02-17 DIAGNOSIS — F0390 Unspecified dementia without behavioral disturbance: Secondary | ICD-10-CM | POA: Diagnosis present

## 2015-02-17 DIAGNOSIS — Z79899 Other long term (current) drug therapy: Secondary | ICD-10-CM

## 2015-02-17 DIAGNOSIS — R627 Adult failure to thrive: Secondary | ICD-10-CM | POA: Diagnosis present

## 2015-02-17 DIAGNOSIS — Z7401 Bed confinement status: Secondary | ICD-10-CM

## 2015-02-17 DIAGNOSIS — E119 Type 2 diabetes mellitus without complications: Secondary | ICD-10-CM | POA: Diagnosis not present

## 2015-02-17 DIAGNOSIS — N189 Chronic kidney disease, unspecified: Secondary | ICD-10-CM | POA: Diagnosis not present

## 2015-02-17 DIAGNOSIS — Z9181 History of falling: Secondary | ICD-10-CM | POA: Diagnosis not present

## 2015-02-17 DIAGNOSIS — L89152 Pressure ulcer of sacral region, stage 2: Secondary | ICD-10-CM | POA: Diagnosis not present

## 2015-02-17 DIAGNOSIS — F419 Anxiety disorder, unspecified: Secondary | ICD-10-CM | POA: Diagnosis not present

## 2015-02-17 DIAGNOSIS — T148XXA Other injury of unspecified body region, initial encounter: Secondary | ICD-10-CM

## 2015-02-17 DIAGNOSIS — L0231 Cutaneous abscess of buttock: Secondary | ICD-10-CM

## 2015-02-17 DIAGNOSIS — G039 Meningitis, unspecified: Secondary | ICD-10-CM | POA: Diagnosis not present

## 2015-02-17 DIAGNOSIS — R059 Cough, unspecified: Secondary | ICD-10-CM

## 2015-02-17 DIAGNOSIS — A4902 Methicillin resistant Staphylococcus aureus infection, unspecified site: Secondary | ICD-10-CM | POA: Diagnosis not present

## 2015-02-17 DIAGNOSIS — L03115 Cellulitis of right lower limb: Secondary | ICD-10-CM | POA: Diagnosis not present

## 2015-02-17 DIAGNOSIS — R197 Diarrhea, unspecified: Secondary | ICD-10-CM

## 2015-02-17 DIAGNOSIS — L89159 Pressure ulcer of sacral region, unspecified stage: Secondary | ICD-10-CM | POA: Diagnosis not present

## 2015-02-17 DIAGNOSIS — A4101 Sepsis due to Methicillin susceptible Staphylococcus aureus: Secondary | ICD-10-CM | POA: Diagnosis not present

## 2015-02-17 DIAGNOSIS — Z48817 Encounter for surgical aftercare following surgery on the skin and subcutaneous tissue: Secondary | ICD-10-CM | POA: Diagnosis not present

## 2015-02-17 DIAGNOSIS — J9811 Atelectasis: Secondary | ICD-10-CM | POA: Diagnosis not present

## 2015-02-17 DIAGNOSIS — R509 Fever, unspecified: Secondary | ICD-10-CM | POA: Diagnosis not present

## 2015-02-17 DIAGNOSIS — E860 Dehydration: Secondary | ICD-10-CM | POA: Diagnosis not present

## 2015-02-17 HISTORY — DX: Pressure ulcer of sacral region, unspecified stage: L89.159

## 2015-02-17 LAB — URINALYSIS COMPLETE WITH MICROSCOPIC (ARMC ONLY)
Bacteria, UA: NONE SEEN
Bilirubin Urine: NEGATIVE
Glucose, UA: NEGATIVE mg/dL
Hgb urine dipstick: NEGATIVE
Ketones, ur: NEGATIVE mg/dL
Nitrite: NEGATIVE
PROTEIN: NEGATIVE mg/dL
RBC / HPF: NONE SEEN RBC/hpf (ref 0–5)
SPECIFIC GRAVITY, URINE: 1.009 (ref 1.005–1.030)
pH: 7 (ref 5.0–8.0)

## 2015-02-17 LAB — COMPREHENSIVE METABOLIC PANEL
ALT: 23 U/L (ref 14–54)
ANION GAP: 15 (ref 5–15)
AST: 59 U/L — ABNORMAL HIGH (ref 15–41)
Albumin: 2.5 g/dL — ABNORMAL LOW (ref 3.5–5.0)
Alkaline Phosphatase: 285 U/L — ABNORMAL HIGH (ref 38–126)
BUN: 16 mg/dL (ref 6–20)
CHLORIDE: 87 mmol/L — AB (ref 101–111)
CO2: 22 mmol/L (ref 22–32)
Calcium: 8.4 mg/dL — ABNORMAL LOW (ref 8.9–10.3)
Creatinine, Ser: 0.92 mg/dL (ref 0.44–1.00)
GFR calc non Af Amer: 51 mL/min — ABNORMAL LOW (ref >60)
GFR, EST AFRICAN AMERICAN: 59 mL/min — AB (ref >60)
Glucose, Bld: 118 mg/dL — ABNORMAL HIGH (ref 65–99)
Potassium: 3.4 mmol/L — ABNORMAL LOW (ref 3.5–5.1)
SODIUM: 124 mmol/L — AB (ref 135–145)
Total Bilirubin: UNDETERMINED mg/dL (ref 0.3–1.2)
Total Protein: 6.5 g/dL (ref 6.5–8.1)

## 2015-02-17 LAB — CULTURE, BLOOD (ROUTINE X 2): CULTURE: NO GROWTH

## 2015-02-17 LAB — CBC WITH DIFFERENTIAL/PLATELET
Basophils Absolute: 0 10*3/uL (ref 0–0.1)
Basophils Relative: 0 %
Eosinophils Absolute: 0.1 10*3/uL (ref 0–0.7)
Eosinophils Relative: 1 %
HEMATOCRIT: 33.3 % — AB (ref 35.0–47.0)
HEMOGLOBIN: 11.2 g/dL — AB (ref 12.0–16.0)
LYMPHS ABS: 1.7 10*3/uL (ref 1.0–3.6)
Lymphocytes Relative: 24 %
MCH: 30.8 pg (ref 26.0–34.0)
MCHC: 33.4 g/dL (ref 32.0–36.0)
MCV: 92.2 fL (ref 80.0–100.0)
MONOS PCT: 14 %
Monocytes Absolute: 1 10*3/uL — ABNORMAL HIGH (ref 0.2–0.9)
NEUTROS ABS: 4.3 10*3/uL (ref 1.4–6.5)
NEUTROS PCT: 61 %
Platelets: 202 10*3/uL (ref 150–440)
RBC: 3.61 MIL/uL — ABNORMAL LOW (ref 3.80–5.20)
RDW: 13.8 % (ref 11.5–14.5)
WBC: 7.2 10*3/uL (ref 3.6–11.0)

## 2015-02-17 LAB — BILIRUBIN, TOTAL: BILIRUBIN TOTAL: 0.5 mg/dL (ref 0.3–1.2)

## 2015-02-17 LAB — LACTIC ACID, PLASMA: Lactic Acid, Venous: 1.3 mmol/L (ref 0.5–2.0)

## 2015-02-17 MED ORDER — SODIUM CHLORIDE 0.9 % IV BOLUS (SEPSIS)
1000.0000 mL | INTRAVENOUS | Status: AC
  Administered 2015-02-17 (×2): 1000 mL via INTRAVENOUS

## 2015-02-17 MED ORDER — IOHEXOL 300 MG/ML  SOLN
100.0000 mL | Freq: Once | INTRAMUSCULAR | Status: AC | PRN
  Administered 2015-02-17: 100 mL via INTRAVENOUS

## 2015-02-17 MED ORDER — PIPERACILLIN-TAZOBACTAM 3.375 G IVPB 30 MIN
3.3750 g | Freq: Once | INTRAVENOUS | Status: AC
Start: 2015-02-17 — End: 2015-02-17
  Administered 2015-02-17: 3.375 g via INTRAVENOUS
  Filled 2015-02-17: qty 50

## 2015-02-17 MED ORDER — VANCOMYCIN HCL IN DEXTROSE 1-5 GM/200ML-% IV SOLN
1000.0000 mg | Freq: Once | INTRAVENOUS | Status: AC
  Administered 2015-02-17: 1000 mg via INTRAVENOUS
  Filled 2015-02-17: qty 200

## 2015-02-17 MED ORDER — IOHEXOL 240 MG/ML SOLN
25.0000 mL | Freq: Once | INTRAMUSCULAR | Status: AC | PRN
  Administered 2015-02-17: 25 mL via ORAL

## 2015-02-17 MED ORDER — SODIUM CHLORIDE 0.9 % IV BOLUS (SEPSIS)
500.0000 mL | INTRAVENOUS | Status: AC
  Administered 2015-02-17: 500 mL via INTRAVENOUS

## 2015-02-17 NOTE — Progress Notes (Signed)
Pharmacy Antibiotic Note  Gina [name] is a 82 y.o. female admitted on 02/17/2015 with cellulitis.  Pharmacy has been consulted for Zosyn and vancomycin dosing. Patient recently (02/15/15) had positive blood culture with MSSA, ID recommended cefazolin. This regimen will cover for MSSA as well.  Plan: 1. Zosyn 3.375 gm IV Q8H EI 2. Vancomycin 1 gm IV x 1 given in ED followed by 750 mg IV Q24H, predicted trough 15 mcg/mL. Pharmacy will continue to follow and adjust as needed to maintain trough 15 to 20 mcg/mL (higher than cellulitis range to cover for bacteremia as well).  Weight: 162 lb 14.4 oz (73.891 kg)  Temp (24hrs), Avg:99.8 F (37.7 C), Min:99.5 F (37.5 C), Max:100.4 F (38 C)   Recent Labs Lab 02/12/15 0012 02/12/15 0746 02/13/15 0608 02/14/15 0509 02/17/15 2040 02/17/15 2042  WBC 25.4* 18.2* 16.5* 12.8*  --  7.2  CREATININE 1.20* 1.05* 0.94 0.87  --  0.92  LATICACIDVEN  --   --   --   --  1.3  --     Estimated Creatinine Clearance: 36 mL/min (by C-G formula based on Cr of 0.92).    Allergies  Allergen Reactions  . Fosamax [Alendronate Sodium] Other (See Comments)    Reaction: Unknown    Thank you for allowing pharmacy to be a part of this patient's care.  Carola Frost, Pharm.D., BCPS Clinical Pharmacist 02/17/2015 10:46 PM

## 2015-02-17 NOTE — ED Provider Notes (Signed)
[facility] Emergency Department Provider Note  ____________________________________________  Time seen: Seen upon arrival to the emergency department  I have reviewed the triage vital signs and the nursing notes.   HISTORY  Chief Complaint Wound Infection    HPI Gina [name] is a 82 y.o. female history of diabetes and dementia who is presenting today with altered mental status and a worsening sacral decub. She also had a blood culture from recent admission which was positive for staph aureus. The patient has no complaints at this time. She says that she is feeling fine and was not in any pain.  Per EMS, she has been running a low-grade fever at her nursing home. She also had some confusion tonight and was pocketing her medications. She was eventually sent in because of worsening of appearance of her sacral decubitus ulcer which began oozing pus.   Past Medical History  Diagnosis Date  . Diabetes mellitus without complication (HCC)   . Hypertension   . Dementia   . Anxiety   . GERD (gastroesophageal reflux disease)   . Osteoporosis   . Hypothyroidism     Patient Active Problem List   Diagnosis Date Noted  . Cellulitis 02/12/2015  . Hip fracture (HCC) 10/20/2014  . Pressure ulcer 10/20/2014  . HYPOTHYROIDISM 04/10/2006  . DIABETES MELLITUS, WITH GASTROPARESIS 04/10/2006  . HYPERCHOLESTEROLEMIA 04/10/2006  . DEMENTIA 04/10/2006  . ANXIETY 04/10/2006  . Essential hypertension 04/10/2006  . ATHEROSCLEROTIC CARDIOVASCULAR DISEASE 04/10/2006  . GERD 04/10/2006  . CONSTIPATION 04/10/2006  . DIABETES MELLITUS, TYPE II 05/09/2005    Past Surgical History  Procedure Laterality Date  . Intramedullary (im) nail intertrochanteric Right 10/21/2014    Procedure: INTRAMEDULLARY (IM) NAIL INTERTROCHANTRIC;  Surgeon: Deeann Saint, MD;  Location: [facility] ORS;  Service: Orthopedics;  Laterality: Right;  . Debridement and closure wound Right 02/12/2015     Procedure: DEBRIDEMENT AND CLOSURE WOUND;  Surgeon: Christena Flake, MD;  Location: [facility] ORS;  Service: Orthopedics;  Laterality: Right;    Current Outpatient Rx  Name  Route  Sig  Dispense  Refill  . acetaminophen (TYLENOL) 325 MG tablet   Oral   Take by mouth every 4 (four) hours as needed for mild pain, moderate pain or fever.         . ALPRAZolam (XANAX) 0.25 MG tablet   Oral   Take 1 tablet (0.25 mg total) by mouth 2 (two) times daily as needed for anxiety.   14 tablet   0   . ALPRAZolam (XANAX) 0.5 MG tablet   Oral   Take 1 tablet (0.5 mg total) by mouth at bedtime.   10 tablet   0   . atorvastatin (LIPITOR) 10 MG tablet   Oral   Take 10 mg by mouth daily at 6 PM.          . calcium-vitamin D (OSCAL WITH D) 500-200 MG-UNIT tablet   Oral   Take 1 tablet by mouth 2 (two) times daily.   60 tablet   0   . cefTRIAXone (ROCEPHIN) 1 g injection   Intramuscular   Inject 2 g into the muscle daily.   4 each   0   . cephALEXin (KEFLEX) 500 MG capsule   Oral   Take 1 capsule (500 mg total) by mouth 4 (four) times daily.   48 capsule   0   . Cholecalciferol (VITAMIN D3) 50000 units CAPS   Oral   Take 1 capsule by mouth once a  week. Takes on Saturday         . ferrous sulfate 325 (65 FE) MG tablet   Oral   Take 1 tablet (325 mg total) by mouth 2 (two) times daily with a meal.   60 tablet   3   . HYDROcodone-acetaminophen (NORCO/VICODIN) 5-325 MG tablet   Oral   Take 1 tablet by mouth every 6 (six) hours as needed for moderate pain.   16 tablet   0   . levothyroxine (SYNTHROID, LEVOTHROID) 137 MCG tablet   Oral   Take 137 mcg by mouth daily before breakfast.         . loperamide (IMODIUM) 2 MG capsule   Oral   Take 2 mg by mouth every 4 (four) hours as needed for diarrhea or loose stools.          . Menthol-Zinc Oxide (REMEDY CALAZIME) 0.2-20 % PSTE   Apply externally   Apply 1 application topically as needed (skin irritation). Apply to excoriated  areas on sacrum         . metFORMIN (GLUCOPHAGE) 500 MG tablet   Oral   Take 500 mg by mouth 2 (two) times daily with a meal.         . nystatin cream (MYCOSTATIN)   Topical   Apply 1 application topically as needed (rash). Apply to groin         . omeprazole (PRILOSEC) 20 MG capsule   Oral   Take 20 mg by mouth daily.         . ranitidine (ZANTAC) 150 MG tablet   Oral   Take 150 mg by mouth at bedtime.         . senna (SENOKOT) 8.6 MG TABS tablet   Oral   Take 1 tablet (8.6 mg total) by mouth daily as needed for mild constipation.   30 each   0   . sodium chloride (OCEAN) 0.65 % SOLN nasal spray   Each Nare   Place 2 sprays into both nostrils every 6 (six) hours as needed for congestion.          . sodium chloride 1 G tablet   Oral   Take 1 g by mouth 2 (two) times daily with a meal.         . traZODone (DESYREL) 50 MG tablet   Oral   Take 50 mg by mouth at bedtime.         . verapamil (VERELAN PM) 240 MG 24 hr capsule   Oral   Take 240 mg by mouth daily.          . vitamin C (ASCORBIC ACID) 500 MG tablet   Oral   Take 500 mg by mouth daily.           Allergies Fosamax  Family History  Problem Relation Age of Onset  . Family history unknown: Yes    Social History Social History  Substance Use Topics  . Smoking status: Never Smoker   . Smokeless tobacco: Never Used  . Alcohol Use: No    Review of Systems Constitutional: No fever/chills Eyes: No visual changes. ENT: No sore throat. Cardiovascular: Denies chest pain. Respiratory: Denies shortness of breath. Gastrointestinal: No abdominal pain.  No nausea, no vomiting.  No diarrhea.  No constipation. Genitourinary: Negative for dysuria. Musculoskeletal: Negative for back pain. Skin: Negative for rash. Neurological: Negative for headaches, focal weakness or numbness.  10-point ROS otherwise negative.  ____________________________________________   PHYSICAL EXAM:  VITAL  SIGNS: ED Triage Vitals  Enc Vitals Group     BP --      Pulse Rate 02/17/15 2028 83     Resp 02/17/15 2028 18     Temp 02/17/15 2028 100.4 F (38 C)     Temp Source 02/17/15 2028 Rectal     SpO2 02/17/15 2028 97 %     Weight 02/17/15 2028 162 lb 14.4 oz (73.891 kg)     Height --      Head Cir --      Peak Flow --      Pain Score 02/17/15 2032 10     Pain Loc --      Pain Edu? --      Excl. in GC? --     Constitutional: Alert and oriented. Well appearing and in no acute distress. Eyes: Conjunctivae are normal. PERRL. EOMI. Head: Atraumatic. Nose: No congestion/rhinnorhea. Mouth/Throat: Mucous membranes are moist.  Oropharynx non-erythematous. Neck: No stridor.   Cardiovascular: Normal rate, regular rhythm. Grossly normal heart sounds.  Good peripheral circulation. Respiratory: Normal respiratory effort.  No retractions. Lungs CTAB. Gastrointestinal: Soft and nontender. No distention. No abdominal bruits. No CVA tenderness. Musculoskeletal: No lower extremity tenderness nor edema.  No joint effusions. Neurologic:  Normal speech and language. No gross focal neurologic deficits are appreciated. Skin: Right lateral thigh wound which is round with a diameter 4 cm and 3 cm deep with pink granulation tissue without any pus or surrounding erythema. Over the right, medial buttock there is an oozing, open wound with cellulitis extending onto the bilateral buttocks up to the low lumbar area and anteriorly overlying the labia and proximal, inner thighs. There is no induration. The skin is warm to touch. Psychiatric: Mood and affect are normal. Speech and behavior are normal.  ____________________________________________   LABS (all labs ordered are listed, but only abnormal results are displayed)  Labs Reviewed  COMPREHENSIVE METABOLIC PANEL - Abnormal; Notable for the following:    Sodium 124 (*)    Potassium 3.4 (*)    Chloride 87 (*)    Glucose, Bld 118 (*)    Calcium 8.4 (*)     Albumin 2.5 (*)    AST 59 (*)    Alkaline Phosphatase 285 (*)    GFR calc non Af Amer 51 (*)    GFR calc Af Amer 59 (*)    All other components within normal limits  CBC WITH DIFFERENTIAL/PLATELET - Abnormal; Notable for the following:    RBC 3.61 (*)    Hemoglobin 11.2 (*)    HCT 33.3 (*)    Monocytes Absolute 1.0 (*)    All other components within normal limits  URINALYSIS COMPLETEWITH MICROSCOPIC ([facility] ONLY) - Abnormal; Notable for the following:    Color, Urine YELLOW (*)    APPearance HAZY (*)    Leukocytes, UA TRACE (*)    Squamous Epithelial / LPF 0-5 (*)    All other components within normal limits  CULTURE, BLOOD (ROUTINE X 2)  CULTURE, BLOOD (ROUTINE X 2)  URINE CULTURE  LACTIC ACID, PLASMA  BILIRUBIN, TOTAL  LACTIC ACID, PLASMA   ____________________________________________  EKG  ED ECG REPORT I, Izear Pine,  Teena Irani, the attending physician, personally viewed and interpreted this ECG.   Date: 02/17/2015  EKG Time: 2045  Rate: 79  Rhythm: normal sinus rhythm  Axis: Normal axis  Intervals:Prolonged PR interval. Right bundle branch block and left anterior fascicular block.  ST&T Change: No ST segment  elevation or depression. Single T-wave inversion in V3. No significant change from EKG done on 09/30/2014.  ____________________________________________  RADIOLOGY   IMPRESSION: Relatively large fistulous tract extending from the lower rectum/ anal region into the midline inferior sacral skin ulcer. No drainable fluid collection or abscess identified.  Sigmoid diverticulosis without definite active inflammatory changes. Mild thickening of the rectosigmoid may represent mild superimposed inflammation/infection. No evidence of bowel obstruction.  Diffuse thickening and trabecular appearance of the bladder wall likely related to chronic bladder outlet obstruction. There is possible superimposed cystitis. Correlation with  urinalysis recommended.  Cirrhosis.  These results were called by telephone at the time of interpretation on 02/17/2015 at 11:34 pm to Dr. Gladstone Pih , who verbally acknowledged these results. ____________________________________________   PROCEDURES   ____________________________________________   INITIAL IMPRESSION / ASSESSMENT AND PLAN / ED COURSE  Pertinent labs & imaging results that were available during my care of the patient were reviewed by me and considered in my medical decision making (see chart for details).  ----------------------------------------- 11:51 PM on 02/17/2015 ----------------------------------------- Discussed the case with Dr. 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 She says that there is induration area but does not see a definite tract from the rectum connected to the skin. She says she will follow as a Research scientist (medical). I discussed the findings on the CAT scan with the family as well as the patient. I did not tell them that there was a definitive rectocutaneous fistula because it seems at this point that it is being debated between the radiology read in the surgeon. I will leave it up to the admitting team and consultants to make a final decision. The patient as well as her daughter who is in the room understands plan for Mr. willing to comply. Signed out to Dr. Tobi Bastos.    ____________________________________________   FINAL CLINICAL IMPRESSION(S) / ED DIAGNOSES  Buttock abscess. Sepsis.      Myrna Blazer, MD 02/18/15 0001

## 2015-02-17 NOTE — ED Notes (Signed)
Patient transported to CT 

## 2015-02-17 NOTE — ED Notes (Signed)
Pt presents to ED via EMS from [facility] with possible worsening wound infection. Pt was treated in this hospital and discharged 3 days ago for a possible spider bite to her right hip. Wound cultures +for staph and [facility] requested pt to return for further tx. Pt family refused. Pt returning tonight from facility with wound drainage and edges becoming necrotic. Slight fever for the past two days.

## 2015-02-18 ENCOUNTER — Encounter: Payer: Self-pay | Admitting: Internal Medicine

## 2015-02-18 DIAGNOSIS — K632 Fistula of intestine: Secondary | ICD-10-CM | POA: Diagnosis present

## 2015-02-18 DIAGNOSIS — R001 Bradycardia, unspecified: Secondary | ICD-10-CM | POA: Diagnosis not present

## 2015-02-18 DIAGNOSIS — E871 Hypo-osmolality and hyponatremia: Secondary | ICD-10-CM | POA: Diagnosis present

## 2015-02-18 DIAGNOSIS — K604 Rectal fistula: Secondary | ICD-10-CM | POA: Diagnosis present

## 2015-02-18 DIAGNOSIS — L89151 Pressure ulcer of sacral region, stage 1: Secondary | ICD-10-CM | POA: Diagnosis present

## 2015-02-18 DIAGNOSIS — Z66 Do not resuscitate: Secondary | ICD-10-CM | POA: Diagnosis present

## 2015-02-18 DIAGNOSIS — R1310 Dysphagia, unspecified: Secondary | ICD-10-CM | POA: Diagnosis present

## 2015-02-18 DIAGNOSIS — E039 Hypothyroidism, unspecified: Secondary | ICD-10-CM | POA: Diagnosis present

## 2015-02-18 DIAGNOSIS — R7881 Bacteremia: Secondary | ICD-10-CM | POA: Diagnosis present

## 2015-02-18 DIAGNOSIS — B369 Superficial mycosis, unspecified: Secondary | ICD-10-CM | POA: Diagnosis present

## 2015-02-18 DIAGNOSIS — Z1623 Resistance to quinolones and fluoroquinolones: Secondary | ICD-10-CM | POA: Diagnosis present

## 2015-02-18 DIAGNOSIS — Z7984 Long term (current) use of oral hypoglycemic drugs: Secondary | ICD-10-CM | POA: Diagnosis not present

## 2015-02-18 DIAGNOSIS — E119 Type 2 diabetes mellitus without complications: Secondary | ICD-10-CM | POA: Diagnosis present

## 2015-02-18 DIAGNOSIS — Z833 Family history of diabetes mellitus: Secondary | ICD-10-CM | POA: Diagnosis not present

## 2015-02-18 DIAGNOSIS — E876 Hypokalemia: Secondary | ICD-10-CM | POA: Diagnosis present

## 2015-02-18 DIAGNOSIS — Z7401 Bed confinement status: Secondary | ICD-10-CM | POA: Diagnosis not present

## 2015-02-18 DIAGNOSIS — B9561 Methicillin susceptible Staphylococcus aureus infection as the cause of diseases classified elsewhere: Secondary | ICD-10-CM | POA: Diagnosis present

## 2015-02-18 DIAGNOSIS — T148XXA Other injury of unspecified body region, initial encounter: Secondary | ICD-10-CM

## 2015-02-18 DIAGNOSIS — F0390 Unspecified dementia without behavioral disturbance: Secondary | ICD-10-CM | POA: Diagnosis present

## 2015-02-18 DIAGNOSIS — Z79899 Other long term (current) drug therapy: Secondary | ICD-10-CM | POA: Diagnosis not present

## 2015-02-18 DIAGNOSIS — K219 Gastro-esophageal reflux disease without esophagitis: Secondary | ICD-10-CM | POA: Diagnosis present

## 2015-02-18 DIAGNOSIS — M810 Age-related osteoporosis without current pathological fracture: Secondary | ICD-10-CM | POA: Diagnosis present

## 2015-02-18 DIAGNOSIS — E0781 Sick-euthyroid syndrome: Secondary | ICD-10-CM | POA: Diagnosis present

## 2015-02-18 DIAGNOSIS — I10 Essential (primary) hypertension: Secondary | ICD-10-CM | POA: Diagnosis present

## 2015-02-18 DIAGNOSIS — L0231 Cutaneous abscess of buttock: Secondary | ICD-10-CM | POA: Diagnosis present

## 2015-02-18 DIAGNOSIS — L089 Local infection of the skin and subcutaneous tissue, unspecified: Secondary | ICD-10-CM

## 2015-02-18 DIAGNOSIS — Z792 Long term (current) use of antibiotics: Secondary | ICD-10-CM | POA: Diagnosis not present

## 2015-02-18 DIAGNOSIS — R627 Adult failure to thrive: Secondary | ICD-10-CM | POA: Diagnosis present

## 2015-02-18 DIAGNOSIS — B373 Candidiasis of vulva and vagina: Secondary | ICD-10-CM | POA: Diagnosis present

## 2015-02-18 LAB — CBC
HCT: 28.9 % — ABNORMAL LOW (ref 35.0–47.0)
Hemoglobin: 10 g/dL — ABNORMAL LOW (ref 12.0–16.0)
MCH: 31.8 pg (ref 26.0–34.0)
MCHC: 34.6 g/dL (ref 32.0–36.0)
MCV: 91.7 fL (ref 80.0–100.0)
PLATELETS: 179 10*3/uL (ref 150–440)
RBC: 3.16 MIL/uL — AB (ref 3.80–5.20)
RDW: 13.8 % (ref 11.5–14.5)
WBC: 6.3 10*3/uL (ref 3.6–11.0)

## 2015-02-18 LAB — C DIFFICILE QUICK SCREEN W PCR REFLEX
C DIFFICILE (CDIFF) INTERP: NEGATIVE
C DIFFICLE (CDIFF) ANTIGEN: NEGATIVE
C Diff toxin: NEGATIVE

## 2015-02-18 LAB — BASIC METABOLIC PANEL
Anion gap: 10 (ref 5–15)
BUN: 12 mg/dL (ref 6–20)
CHLORIDE: 92 mmol/L — AB (ref 101–111)
CO2: 23 mmol/L (ref 22–32)
CREATININE: 0.7 mg/dL (ref 0.44–1.00)
Calcium: 7.7 mg/dL — ABNORMAL LOW (ref 8.9–10.3)
GFR calc non Af Amer: >60 mL/min (ref >60)
GLUCOSE: 101 mg/dL — AB (ref 65–99)
Potassium: 2.9 mmol/L — CL (ref 3.5–5.1)
Sodium: 125 mmol/L — ABNORMAL LOW (ref 135–145)

## 2015-02-18 LAB — MAGNESIUM: Magnesium: 1.3 mg/dL — ABNORMAL LOW (ref 1.7–2.4)

## 2015-02-18 MED ORDER — SALINE SPRAY 0.65 % NA SOLN
2.0000 | Freq: Four times a day (QID) | NASAL | Status: DC | PRN
  Filled 2015-02-18: qty 44

## 2015-02-18 MED ORDER — METFORMIN HCL 500 MG PO TABS
500.0000 mg | ORAL_TABLET | Freq: Two times a day (BID) | ORAL | Status: DC
  Administered 2015-02-20 – 2015-02-23 (×7): 500 mg via ORAL
  Filled 2015-02-18 (×8): qty 1

## 2015-02-18 MED ORDER — ONDANSETRON HCL 4 MG/2ML IJ SOLN: 4.0000 mg | Freq: Four times a day (QID) | INTRAMUSCULAR | Status: DC | PRN

## 2015-02-18 MED ORDER — ACETAMINOPHEN 325 MG PO TABS: 325.0000 mg | ORAL_TABLET | ORAL | Status: DC | PRN

## 2015-02-18 MED ORDER — FERROUS SULFATE 325 (65 FE) MG PO TABS
325.0000 mg | ORAL_TABLET | Freq: Two times a day (BID) | ORAL | Status: DC
  Administered 2015-02-18 – 2015-02-23 (×11): 325 mg via ORAL
  Filled 2015-02-18 (×11): qty 1

## 2015-02-18 MED ORDER — VANCOMYCIN HCL IN DEXTROSE 750-5 MG/150ML-% IV SOLN
750.0000 mg | INTRAVENOUS | Status: DC
  Administered 2015-02-18: 750 mg via INTRAVENOUS
  Filled 2015-02-18: qty 150

## 2015-02-18 MED ORDER — MAGNESIUM SULFATE 4 GM/100ML IV SOLN
4.0000 g | Freq: Once | INTRAVENOUS | Status: AC
  Administered 2015-02-18: 4 g via INTRAVENOUS
  Filled 2015-02-18: qty 100

## 2015-02-18 MED ORDER — CLOTRIMAZOLE 1 % EX CREA
TOPICAL_CREAM | Freq: Two times a day (BID) | CUTANEOUS | Status: DC
  Administered 2015-02-18 – 2015-02-23 (×10): via TOPICAL
  Filled 2015-02-18: qty 15

## 2015-02-18 MED ORDER — MENTHOL-ZINC OXIDE 0.2-20 % EX PSTE
1.0000 "application " | PASTE | CUTANEOUS | Status: DC | PRN
  Filled 2015-02-18: qty 113

## 2015-02-18 MED ORDER — ENOXAPARIN SODIUM 40 MG/0.4ML ~~LOC~~ SOLN
40.0000 mg | Freq: Every day | SUBCUTANEOUS | Status: DC
  Administered 2015-02-18 – 2015-02-22 (×6): 40 mg via SUBCUTANEOUS
  Filled 2015-02-18 (×6): qty 0.4

## 2015-02-18 MED ORDER — SODIUM CHLORIDE 1 G PO TABS
1.0000 g | ORAL_TABLET | Freq: Two times a day (BID) | ORAL | Status: DC
  Administered 2015-02-18 – 2015-02-23 (×10): 1 g via ORAL
  Filled 2015-02-18 (×16): qty 1

## 2015-02-18 MED ORDER — METRONIDAZOLE IN NACL 5-0.79 MG/ML-% IV SOLN
500.0000 mg | Freq: Three times a day (TID) | INTRAVENOUS | Status: DC
  Administered 2015-02-18 – 2015-02-23 (×15): 500 mg via INTRAVENOUS
  Filled 2015-02-18 (×17): qty 100

## 2015-02-18 MED ORDER — CEFTRIAXONE SODIUM 1 G IJ SOLR: 1.0000 g | INTRAMUSCULAR | Status: DC

## 2015-02-18 MED ORDER — ONDANSETRON HCL 4 MG PO TABS: 4.0000 mg | ORAL_TABLET | Freq: Four times a day (QID) | ORAL | Status: DC | PRN

## 2015-02-18 MED ORDER — VITAMIN C 500 MG PO TABS
500.0000 mg | ORAL_TABLET | Freq: Every day | ORAL | Status: DC
  Administered 2015-02-18 – 2015-02-23 (×6): 500 mg via ORAL
  Filled 2015-02-18 (×7): qty 1

## 2015-02-18 MED ORDER — PIPERACILLIN-TAZOBACTAM 3.375 G IVPB
3.3750 g | Freq: Three times a day (TID) | INTRAVENOUS | Status: DC
  Administered 2015-02-18 (×2): 3.375 g via INTRAVENOUS
  Filled 2015-02-18 (×3): qty 50

## 2015-02-18 MED ORDER — POTASSIUM CHLORIDE CRYS ER 20 MEQ PO TBCR
40.0000 meq | EXTENDED_RELEASE_TABLET | ORAL | Status: AC
  Administered 2015-02-18: 40 meq via ORAL
  Filled 2015-02-18: qty 2

## 2015-02-18 MED ORDER — CALCIUM CARBONATE-VITAMIN D 500-200 MG-UNIT PO TABS
1.0000 | ORAL_TABLET | Freq: Two times a day (BID) | ORAL | Status: DC
  Administered 2015-02-18 – 2015-02-23 (×11): 1 via ORAL
  Filled 2015-02-18 (×12): qty 1

## 2015-02-18 MED ORDER — ATORVASTATIN CALCIUM 10 MG PO TABS
10.0000 mg | ORAL_TABLET | Freq: Every day | ORAL | Status: DC
  Administered 2015-02-18 – 2015-02-22 (×5): 10 mg via ORAL
  Filled 2015-02-18 (×5): qty 1

## 2015-02-18 MED ORDER — VERAPAMIL HCL ER 120 MG PO TBCR
240.0000 mg | EXTENDED_RELEASE_TABLET | Freq: Every day | ORAL | Status: DC
  Administered 2015-02-18 – 2015-02-23 (×6): 240 mg via ORAL
  Filled 2015-02-18 (×6): qty 2

## 2015-02-18 MED ORDER — ALPRAZOLAM 0.25 MG PO TABS
0.2500 mg | ORAL_TABLET | Freq: Two times a day (BID) | ORAL | Status: DC | PRN
  Filled 2015-02-18: qty 1

## 2015-02-18 MED ORDER — POTASSIUM CHLORIDE 10 MEQ/100ML IV SOLN
10.0000 meq | INTRAVENOUS | Status: AC
  Administered 2015-02-18 – 2015-02-19 (×4): 10 meq via INTRAVENOUS
  Filled 2015-02-18 (×4): qty 100

## 2015-02-18 MED ORDER — SODIUM CHLORIDE 0.9 % IV SOLN
INTRAVENOUS | Status: DC
  Administered 2015-02-18 – 2015-02-19 (×3): via INTRAVENOUS

## 2015-02-18 MED ORDER — TRAZODONE HCL 50 MG PO TABS
50.0000 mg | ORAL_TABLET | Freq: Every day | ORAL | Status: DC
  Administered 2015-02-18 – 2015-02-22 (×5): 50 mg via ORAL
  Filled 2015-02-18 (×5): qty 1

## 2015-02-18 MED ORDER — VITAMIN D (ERGOCALCIFEROL) 1.25 MG (50000 UNIT) PO CAPS
50000.0000 [IU] | ORAL_CAPSULE | ORAL | Status: DC
  Administered 2015-02-19: 50000 [IU] via ORAL
  Filled 2015-02-18 (×3): qty 1

## 2015-02-18 MED ORDER — HYDROCODONE-ACETAMINOPHEN 5-325 MG PO TABS
1.0000 | ORAL_TABLET | Freq: Four times a day (QID) | ORAL | Status: DC | PRN
Start: 2015-02-18 — End: 2015-02-23

## 2015-02-18 MED ORDER — CEFAZOLIN SODIUM-DEXTROSE 2-3 GM-% IV SOLR
2.0000 g | Freq: Three times a day (TID) | INTRAVENOUS | Status: DC
  Administered 2015-02-18 – 2015-02-23 (×14): 2 g via INTRAVENOUS
  Filled 2015-02-18 (×19): qty 50

## 2015-02-18 MED ORDER — ALPRAZOLAM 0.5 MG PO TABS
0.5000 mg | ORAL_TABLET | Freq: Every day | ORAL | Status: DC
  Administered 2015-02-18 – 2015-02-22 (×5): 0.5 mg via ORAL
  Filled 2015-02-18 (×5): qty 1

## 2015-02-18 MED ORDER — FLUCONAZOLE IN SODIUM CHLORIDE 200-0.9 MG/100ML-% IV SOLN
200.0000 mg | INTRAVENOUS | Status: AC
  Administered 2015-02-18 – 2015-02-20 (×3): 200 mg via INTRAVENOUS
  Filled 2015-02-18 (×3): qty 100

## 2015-02-18 MED ORDER — SENNA 8.6 MG PO TABS: 1.0000 | ORAL_TABLET | Freq: Every day | ORAL | Status: DC | PRN

## 2015-02-18 MED ORDER — ENSURE ENLIVE PO LIQD
237.0000 mL | Freq: Three times a day (TID) | ORAL | Status: DC
  Administered 2015-02-18 – 2015-02-22 (×13): 237 mL via ORAL

## 2015-02-18 MED ORDER — POTASSIUM CHLORIDE 10 MEQ/100ML IV SOLN
10.0000 meq | INTRAVENOUS | Status: DC
  Filled 2015-02-18 (×4): qty 100

## 2015-02-18 MED ORDER — LEVOTHYROXINE SODIUM 50 MCG PO TABS
137.0000 ug | ORAL_TABLET | Freq: Every day | ORAL | Status: DC
  Administered 2015-02-18 – 2015-02-23 (×6): 137 ug via ORAL
  Filled 2015-02-18: qty 1
  Filled 2015-02-18: qty 3
  Filled 2015-02-18: qty 2
  Filled 2015-02-18 (×2): qty 3
  Filled 2015-02-18: qty 1
  Filled 2015-02-18: qty 2

## 2015-02-18 MED ORDER — PANTOPRAZOLE SODIUM 40 MG PO TBEC
40.0000 mg | DELAYED_RELEASE_TABLET | Freq: Every day | ORAL | Status: DC
  Administered 2015-02-18 – 2015-02-21 (×4): 40 mg via ORAL
  Filled 2015-02-18 (×4): qty 1

## 2015-02-18 NOTE — Care Management (Signed)
Patient discharged to [facility] 2/13 after stay for cellulitis/ wound infection.  She presented back to ED with low grade fever and concern for worsening wound infection. Chronically low sodium.  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  Spoke with UR about observation status

## 2015-02-18 NOTE — Consult Note (Signed)
Surgical Consultation  02/18/2015  Gina [name] is an 82 y.o. female.   CC: Rectocutaneous fistula  HPI: Demented bedbound patient who had a CT scan suggestive of a rectocutaneous fistula I was asked see the patient for that. No history can be obtained from the patient is all from the chart and from nursing no review of systems is possible either  Past Medical History  Diagnosis Date  . Diabetes mellitus without complication ([place])   . Hypertension   . Dementia   . Anxiety   . GERD (gastroesophageal reflux disease)   . Osteoporosis   . Hypothyroidism   . Sacral decubitus ulcer     Past Surgical History  Procedure Laterality Date  . Intramedullary (im) nail intertrochanteric Right 10/21/2014    Procedure: INTRAMEDULLARY (IM) NAIL INTERTROCHANTRIC;  Surgeon: Earnestine Leys, MD;  Location: [facility] ORS;  Service: Orthopedics;  Laterality: Right;  . Debridement and closure wound Right 02/12/2015    Procedure: DEBRIDEMENT AND CLOSURE WOUND;  Surgeon: Corky Mull, MD;  Location: [facility] ORS;  Service: Orthopedics;  Laterality: Right;    Family History  Problem Relation Age of Onset  . Diabetes Daughter     Social History:  reports that she has never smoked. She has never used smokeless tobacco. She reports that she does not drink alcohol or use illicit drugs.  Allergies:  Allergies  Allergen Reactions  . Fosamax [Alendronate Sodium] Other (See Comments)    Reaction: Unknown    Medications reviewed.   Review of Systems:   Review of Systems  Unable to perform ROS: dementia     Physical Exam:  BP 170/65 mmHg  Pulse 71  Temp(Src) 98.5 F (36.9 C) (Oral)  Resp 24  Ht 5' 4" (1.626 m)  Wt 153 lb 11.2 oz (69.718 kg)  BMI 26.37 kg/m2  SpO2 96%  Physical Exam  Constitutional: No distress.  HENT:  Head: Normocephalic and atraumatic.  Abdominal: Soft. She exhibits no distension.  Genitourinary:  Erythematous area with Foley catheter in place and marked in stool  abundance not able to completely examine the patient around the rectal area due to the amount of stool and drainage area is nontender  Skin: Skin is warm. She is not diaphoretic. There is erythema.      Results for orders placed or performed during the hospital encounter of 02/17/15 (from the past 48 hour(s))  Lactic acid, plasma     Status: None   Collection Time: 02/17/15  8:40 PM  Result Value Ref Range   Lactic Acid, Venous 1.3 0.5 - 2.0 mmol/L  Comprehensive metabolic panel     Status: Abnormal   Collection Time: 02/17/15  8:42 PM  Result Value Ref Range   Sodium 124 (L) 135 - 145 mmol/L   Potassium 3.4 (L) 3.5 - 5.1 mmol/L   Chloride 87 (L) 101 - 111 mmol/L   CO2 22 22 - 32 mmol/L   Glucose, Bld 118 (H) 65 - 99 mg/dL   BUN 16 6 - 20 mg/dL   Creatinine, Ser 0.92 0.44 - 1.00 mg/dL   Calcium 8.4 (L) 8.9 - 10.3 mg/dL   Total Protein 6.5 6.5 - 8.1 g/dL   Albumin 2.5 (L) 3.5 - 5.0 g/dL   AST 59 (H) 15 - 41 U/L   ALT 23 14 - 54 U/L   Alkaline Phosphatase 285 (H) 38 - 126 U/L   Total Bilirubin UNABLE TO REPORT DUE TO QUANTITY OF SAMPLE 0.3 - 1.2 mg/dL  Comment: C/ [facility] RN AT 2235 02/17/15 MLZ  PER RN REPORT WHAT CAN BE REPORTED AND SHE WILL ORDER THE TOTAL BILIRUBIN SEPARATE     GFR calc non Af Amer 51 (L) >60 mL/min   GFR calc Af Amer 59 (L) >60 mL/min    Comment: (NOTE) The eGFR has been calculated using the CKD EPI equation. This calculation has not been validated in all clinical situations. eGFR's persistently <60 mL/min signify possible Chronic Kidney Disease.    Anion gap 15 5 - 15  CBC WITH DIFFERENTIAL     Status: Abnormal   Collection Time: 02/17/15  8:42 PM  Result Value Ref Range   WBC 7.2 3.6 - 11.0 K/uL   RBC 3.61 (L) 3.80 - 5.20 MIL/uL   Hemoglobin 11.2 (L) 12.0 - 16.0 g/dL   HCT 33.3 (L) 35.0 - 47.0 %   MCV 92.2 80.0 - 100.0 fL   MCH 30.8 26.0 - 34.0 pg   MCHC 33.4 32.0 - 36.0 g/dL   RDW 13.8 11.5 - 14.5 %   Platelets 202 150 - 440 K/uL    Neutrophils Relative % 61 %   Neutro Abs 4.3 1.4 - 6.5 K/uL   Lymphocytes Relative 24 %   Lymphs Abs 1.7 1.0 - 3.6 K/uL   Monocytes Relative 14 %   Monocytes Absolute 1.0 (H) 0.2 - 0.9 K/uL   Eosinophils Relative 1 %   Eosinophils Absolute 0.1 0 - 0.7 K/uL   Basophils Relative 0 %   Basophils Absolute 0.0 0 - 0.1 K/uL  Urine culture     Status: None (Preliminary result)   Collection Time: 02/17/15  9:05 PM  Result Value Ref Range   Specimen Description URINE, RANDOM    Special Requests NONE    Culture NO GROWTH < 12 HOURS    Report Status PENDING   Urinalysis complete, with microscopic ([facility] only)     Status: Abnormal   Collection Time: 02/17/15  9:08 PM  Result Value Ref Range   Color, Urine YELLOW (A) YELLOW   APPearance HAZY (A) CLEAR   Glucose, UA NEGATIVE NEGATIVE mg/dL   Bilirubin Urine NEGATIVE NEGATIVE   Ketones, ur NEGATIVE NEGATIVE mg/dL   Specific Gravity, Urine 1.009 1.005 - 1.030   Hgb urine dipstick NEGATIVE NEGATIVE   pH 7.0 5.0 - 8.0   Protein, ur NEGATIVE NEGATIVE mg/dL   Nitrite NEGATIVE NEGATIVE   Leukocytes, UA TRACE (A) NEGATIVE   RBC / HPF NONE SEEN 0 - 5 RBC/hpf   WBC, UA 6-30 0 - 5 WBC/hpf   Bacteria, UA NONE SEEN NONE SEEN   Squamous Epithelial / LPF 0-5 (A) NONE SEEN  Bilirubin, total     Status: None   Collection Time: 02/17/15 10:34 PM  Result Value Ref Range   Total Bilirubin 0.5 0.3 - 1.2 mg/dL  Basic metabolic panel     Status: Abnormal   Collection Time: 02/18/15  4:40 AM  Result Value Ref Range   Sodium 125 (L) 135 - 145 mmol/L   Potassium 2.9 (LL) 3.5 - 5.1 mmol/L    Comment: CRITICAL RESULT CALLED TO, READ BACK BY AND VERIFIED WITH MARCELLA TURNER AT 0533 ON 02/18/15.Marland KitchenMarland KitchenFults    Chloride 92 (L) 101 - 111 mmol/L   CO2 23 22 - 32 mmol/L   Glucose, Bld 101 (H) 65 - 99 mg/dL   BUN 12 6 - 20 mg/dL   Creatinine, Ser 0.70 0.44 - 1.00 mg/dL   Calcium 7.7 (L) 8.9 -  10.3 mg/dL   GFR calc non Af Amer >60 >60 mL/min   GFR calc Af Amer >60 >60  mL/min    Comment: (NOTE) The eGFR has been calculated using the CKD EPI equation. This calculation has not been validated in all clinical situations. eGFR's persistently <60 mL/min signify possible Chronic Kidney Disease.    Anion gap 10 5 - 15  CBC     Status: Abnormal   Collection Time: 02/18/15  4:40 AM  Result Value Ref Range   WBC 6.3 3.6 - 11.0 K/uL   RBC 3.16 (L) 3.80 - 5.20 MIL/uL   Hemoglobin 10.0 (L) 12.0 - 16.0 g/dL   HCT 28.9 (L) 35.0 - 47.0 %   MCV 91.7 80.0 - 100.0 fL   MCH 31.8 26.0 - 34.0 pg   MCHC 34.6 32.0 - 36.0 g/dL   RDW 13.8 11.5 - 14.5 %   Platelets 179 150 - 440 K/uL  Magnesium     Status: Abnormal   Collection Time: 02/18/15  4:40 AM  Result Value Ref Range   Magnesium 1.3 (L) 1.7 - 2.4 mg/dL   Ct Abdomen Pelvis W Contrast  02/17/2015  CLINICAL DATA:  82 year old female with sacral decubitus ulcer and surrounding cellulitis and posturing age. EXAM: CT ABDOMEN AND PELVIS WITH CONTRAST TECHNIQUE: Multidetector CT imaging of the abdomen and pelvis was performed using the standard protocol following bolus administration of intravenous contrast. CONTRAST:  147m OMNIPAQUE IOHEXOL 300 MG/ML  SOLN COMPARISON:  CT dated 01/19/2006 FINDINGS: Partially visualized small bilateral pleural effusions. The visualized lung bases are otherwise clear. There is coronary vascular calcification. No intra-abdominal free air or free fluid. Cirrhosis. Cholecystectomy. There is mild biliary ductal dilatation, likely post cholecystectomy. The pancreas, spleen, and adrenal glands appear unremarkable. Bilateral extrarenal pelvis. There is no hydronephrosis on either side. A 1.8 cm exophytic hypodense lesion arising from the lateral cortex of the left kidney is not well characterized but likely represent cysts. Ultrasound may provide better evaluation. There is no hydronephrosis on either side. 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 The visualized ureters appear unremarkable. There is diffuse thickening and trabecular appearance of the bladder wall compatible with chronic bladder outlet obstruction. There is haziness of the perivesical fat concerning for superimposed infection. Correlation with urinalysis recommended. Underlying infiltrative process is not excluded. Cystoscopy may provide better evaluation if clinically indicated. The catheter and small pockets of air noted within the bladder. Hysterectomy. Oral contrast opacifies multiple loops of small bowel and traverses into the colon without evidence of bowel obstruction. There is sigmoid diverticulosis without active inflammatory changes. There is mild diffuse thickening of the sigmoid colon. A fistulous tract noted extending from the rectum into the sacral skin surface in the midline where there is a small skin defect. This tract measures approximately 6.6 cm in length and 1.6 cm in width. Contrast noted exiting from the sacral cutaneous defect. There are stranding inflammatory changes of the right gluteal subcutaneous fat. No drainable fluid collection or abscess identified. There is aortoiliac atherosclerotic disease. There is a 2.3 cm infrarenal abdominal aortic ectasia. The abdominal aorta and IVC appear patent. No portal venous gas identified. There is no adenopathy. There is diffuse subcutaneous soft tissue stranding of the abdominal wall. There is osteopenia with degenerative changes of the spine. Right hip orthopedic hardware. There is L2 compression fracture with approximately 50% loss of vertebral body height, age indeterminate, likely chronic. Clinical  correlation is recommended. No definite acute fracture identified. IMPRESSION: Relatively large fistulous tract extending from the lower rectum/ anal region into the midline inferior sacral skin ulcer. No drainable fluid collection or abscess identified. Sigmoid diverticulosis without definite active inflammatory changes.  Mild thickening of the rectosigmoid may represent mild superimposed inflammation/infection. No evidence of bowel obstruction. Diffuse thickening and trabecular appearance of the bladder wall likely related to chronic bladder outlet obstruction. There is possible superimposed cystitis. Correlation with urinalysis recommended. Cirrhosis. These results were called by telephone at the time of interpretation on 02/17/2015 at 11:34 pm to Dr. Larae Grooms , who verbally acknowledged these results. Electronically Signed   By: Anner Crete M.D.   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 However with the patient's medical condition surgical intervention other than a colostomy would not be warranted and a colostomy bag in this patient may be difficult to manage  I will discuss these findings with the primary team and consider colostomy at some poor point if indicated  Florene Glen, MD, FACS

## 2015-02-18 NOTE — Progress Notes (Addendum)
[facility] [place] [facility]   PATIENT NAME: Gina [name]    MR#:  [id number]  DATE OF BIRTH:  [birth year]  SUBJECTIVE:  CHIEF COMPLAINT:   Chief Complaint  Patient presents with  . Wound Infection   No complaint, but demented. She had a large diarrhea per RN. REVIEW OF SYSTEMS:  Demented, denied any symptoms.   DRUG ALLERGIES:   Allergies  Allergen Reactions  . Fosamax [Alendronate Sodium] Other (See Comments)    Reaction: Unknown    VITALS:  Blood pressure 170/65, pulse 71, temperature 98.5 F (36.9 C), temperature source Oral, resp. rate 24, height  (1.626 m), weight 69.718 kg (153 lb 11.2 oz), SpO2 96 %.  PHYSICAL EXAMINATION:  GENERAL:  82 y.o.-year-old patient lying in the bed with no acute distress.  EYES: Pupils equal, round, reactive to light and accommodation. No scleral icterus. Extraocular muscles intact.  HEENT: Head atraumatic, normocephalic. Oropharynx and nasopharynx clear.  NECK:  Supple, no jugular venous distention. No thyroid enlargement, no tenderness.  LUNGS: Normal breath sounds bilaterally, no wheezing, rales,rhonchi or crepitation. No use of accessory muscles of respiration.  CARDIOVASCULAR: S1, S2 normal. No murmurs, rubs, or gallops.  ABDOMEN: Soft, nontender, nondistended. Bowel sounds present. No organomegaly or mass.  EXTREMITIES: No pedal edema, cyanosis, or clubbing.  Right sigh in dressing. NEUROLOGIC: Cranial nerves II through XII are intact. Muscle strength 5/5 in all extremities. Sensation intact. Gait not checked.  PSYCHIATRIC: The patient is alert and oriented x 3.  SKIN: sacral ulcer noted, necrotic exudate noted,cutaneous fistula tract noted.   LABORATORY PANEL:   CBC  Recent Labs Lab 02/18/15 0440  WBC 6.3  HGB 10.0*  HCT 28.9*  PLT 179   ------------------------------------------------------------------------------------------------------------------  Chemistries   Recent  Labs Lab 02/17/15 2042 02/17/15 2234 02/18/15 0440  NA 124*  --  125*  K 3.4*  --  2.9*  CL 87*  --  92*  CO2 22  --  23  GLUCOSE 118*  --  101*  BUN 16  --  12  CREATININE 0.92  --  0.70  CALCIUM 8.4*  --  7.7*  MG  --   --  1.3*  AST 59*  --   --   ALT 23  --   --   ALKPHOS 285*  --   --   BILITOT UNABLE TO REPORT DUE TO QUANTITY OF SAMPLE 0.5  --    ------------------------------------------------------------------------------------------------------------------  Cardiac Enzymes No results for input(s): TROPONINI in the last 168 hours. ------------------------------------------------------------------------------------------------------------------  RADIOLOGY:  Ct Abdomen Pelvis W Contrast  02/17/2015  CLINICAL DATA:  82 year old female with sacral decubitus ulcer and surrounding cellulitis and posturing age. EXAM: CT ABDOMEN AND PELVIS WITH CONTRAST TECHNIQUE: Multidetector CT imaging of the abdomen and pelvis was performed using the standard protocol following bolus administration of intravenous contrast. CONTRAST:  OMNIPAQUE IOHEXOL 300 MG/ML  SOLN COMPARISON:  CT dated 01/19/2006 FINDINGS: Partially visualized small bilateral pleural effusions. The visualized lung bases are otherwise clear. There is coronary vascular calcification. No intra-abdominal free air or free fluid. Cirrhosis. Cholecystectomy. There is mild biliary ductal dilatation, likely post cholecystectomy. The pancreas, spleen, and adrenal glands appear unremarkable. Bilateral extrarenal pelvis. There is no hydronephrosis on either side. A 1.8 cm exophytic hypodense lesion arising from the lateral cortex of the left kidney is not well characterized but likely represent cysts. Ultrasound may provide better evaluation. There is no hydronephrosis on either side. 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 The visualized ureters appear unremarkable. There  is diffuse thickening and trabecular appearance of the bladder wall compatible with chronic bladder outlet obstruction. There is haziness of the perivesical fat concerning for superimposed infection. Correlation with urinalysis recommended. Underlying infiltrative process is not excluded. Cystoscopy may provide better evaluation if clinically indicated. The catheter and small pockets of air noted within the bladder. Hysterectomy. Oral contrast opacifies multiple loops of small bowel and traverses into the colon without evidence of bowel obstruction. There is sigmoid diverticulosis without active inflammatory changes. There is mild diffuse thickening of the sigmoid colon. A fistulous tract noted extending from the rectum into the sacral skin surface in the midline where there is a small skin defect. This tract measures approximately 6.6 cm in length and 1.6 cm in width. Contrast noted exiting from the sacral cutaneous defect. There are stranding inflammatory changes of the right gluteal subcutaneous fat. No drainable fluid collection or abscess identified. There is aortoiliac atherosclerotic disease. There is a 2.3 cm infrarenal abdominal aortic ectasia. The abdominal aorta and IVC appear patent. No portal venous gas identified. There is no adenopathy. There is diffuse subcutaneous soft tissue stranding of the abdominal wall. There is osteopenia with degenerative changes of the spine. Right hip orthopedic hardware. There is L2 compression fracture with approximately 50% loss of vertebral body height, age indeterminate, likely chronic. Clinical correlation is recommended. No definite acute fracture identified. IMPRESSION: Relatively large fistulous tract extending from the lower rectum/ anal region into the midline inferior sacral skin ulcer. No drainable fluid collection or abscess identified. Sigmoid diverticulosis without definite active inflammatory changes. Mild thickening of the rectosigmoid may represent mild  superimposed inflammation/infection. No evidence of bowel obstruction. Diffuse thickening and trabecular appearance of the bladder wall likely related to chronic bladder outlet obstruction. There is possible superimposed cystitis. Correlation with urinalysis recommended. Cirrhosis. These results were called by telephone at the time of interpretation on 02/17/2015 at 11:34 pm to Dr. Gladstone Pih , who verbally acknowledged these results. Electronically Signed   By: Elgie Collard M.D.   On: 02/17/2015 23:35    EKG:   Orders placed or performed during the hospital encounter of 10/20/14  . EKG 12-Lead  . EKG 12-Lead    ASSESSMENT AND PLAN:  82 year old female patient with history of diabetes mellitus,, hypertension,, osteoporosis,, dementia, sacral ulcer with necrosis was referred from nursing home for fever. Patient does a low-sodium and currently on salt tablets at nursing home.  1. Rectocutaneous fistula. F/u Surgical consult.   2. Necrotic sacral ulcer. Add flagyl to ancef per Dr. Sampson Goon. wound care.  3. Hyponatremia. NS iv, f/u BMP.  4. Hypertension. Continue verapamil.  5. Type 2 diabetes mellitus. SL.  MSSA bacteremia- repeat bcx pending Change abx to ancef per Dr. Sampson Goon.  Large diarrhea. Check stool C diff. Hypokalemia. Iv KCl. F/u BMP. Hypomagnesemia. Iv mag, f/u level.  All the records are reviewed and case discussed with Care Management/Social Workerr. Management plans discussed with the patient's daugher and they are in agreement.  CODE STATUS: DNR  TOTAL TIME TAKING CARE OF THIS PATIENT: 38 minutes.  Greater than 50% time was spent on coordination of care and face-to-face counseling.  POSSIBLE D/C IN 3 DAYS, DEPENDING ON CLINICAL CONDITION.   Shaune Pollack M.D on 02/18/2015 at 2:14 PM  Between 7am to 6pm - Pager - [phone]  After 6pm go to [email or web address] - password [email or web address] [facility]  [place] [facility]  Office  [phone]  CC: Primary care  physician; No primary care provider on file.

## 2015-02-18 NOTE — ED Notes (Signed)
Report called to RN. Pt to be transported to room 209 via stretcher.

## 2015-02-18 NOTE — ED Notes (Signed)
Consult MD at bedside.

## 2015-02-18 NOTE — Consult Note (Signed)
Pharmacy Antibiotic Note  Gina [name] is a 82 y.o. female admitted on 02/17/2015 with history of MSSA bacteremia.  Pharmacy has been consulted for cefazolin dosing.  Plan: Will initiate cefazolin 2g IV q8hrs for MSSA bacteremia.  Repeat BCx negative so far.  Height:  (162.6 cm) Weight: 153 lb 11.2 oz (69.718 kg) IBW/kg (Calculated) : 54.7  Temp (24hrs), Avg:99.7 F (37.6 C), Min:98.5 F (36.9 C), Max:100.4 F (38 C)   Recent Labs Lab 02/12/15 0746 02/13/15 0608 02/14/15 0509 02/17/15 2040 02/17/15 2042 02/18/15 0440  WBC 18.2* 16.5* 12.8*  --  7.2 6.3  CREATININE 1.05* 0.94 0.87  --  0.92 0.70  LATICACIDVEN  --   --   --  1.3  --   --     Estimated Creatinine Clearance: 40.3 mL/min (by C-G formula based on Cr of 0.7).    Allergies  Allergen Reactions  . Fosamax [Alendronate Sodium] Other (See Comments)    Reaction: Unknown    Antimicrobials this admission: Anti-infectives    Start     Dose/Rate Route Frequency Ordered Stop   02/18/15 1630  metroNIDAZOLE (FLAGYL) IVPB 500 mg     500 mg 100 mL/hr over 60 Minutes Intravenous Every 8 hours 02/18/15 1617     02/18/15 1615  fluconazole (DIFLUCAN) IVPB 200 mg     200 mg 100 mL/hr over 60 Minutes Intravenous Every 24 hours 02/18/15 1606 02/21/15 1614   02/18/15 1100  vancomycin (VANCOCIN) IVPB 750 mg/150 ml premix  Status:  Discontinued     750 mg 150 mL/hr over 60 Minutes Intravenous Every 24 hours 02/18/15 0245 02/18/15 1605   02/18/15 0400  piperacillin-tazobactam (ZOSYN) IVPB 3.375 g  Status:  Discontinued     3.375 g 12.5 mL/hr over 240 Minutes Intravenous 3 times per day 02/18/15 0245 02/18/15 1605   02/18/15 0115  cefTRIAXone (ROCEPHIN) 1 g in dextrose 5 % 50 mL IVPB  Status:  Discontinued     1 g 100 mL/hr over 30 Minutes Intravenous Every 24 hours 02/18/15 0110 02/18/15 0251   02/17/15 2045  piperacillin-tazobactam (ZOSYN) IVPB 3.375 g     3.375 g 100 mL/hr over 30 Minutes Intravenous  Once  02/17/15 2040 02/17/15 2123   02/17/15 2045  vancomycin (VANCOCIN) IVPB 1000 mg/200 mL premix     1,000 mg 200 mL/hr over 60 Minutes Intravenous  Once 02/17/15 2040 02/17/15 2156      Microbiology results: Results for orders placed or performed during the hospital encounter of 02/17/15  Urine culture     Status: None (Preliminary result)   Collection Time: 02/17/15  9:05 PM  Result Value Ref Range Status   Specimen Description URINE, RANDOM  Final   Special Requests NONE  Final   Culture NO GROWTH < 12 HOURS  Final   Report Status PENDING  Incomplete  C difficile quick scan w PCR reflex     Status: None   Collection Time: 02/18/15  3:00 PM  Result Value Ref Range Status   C Diff antigen NEGATIVE NEGATIVE Final   C Diff toxin NEGATIVE NEGATIVE Final   C Diff interpretation Negative for C. difficile  Final    Thank you for allowing pharmacy to be a part of this patient's care.  Cy Blamer 02/18/2015 4:25 PM

## 2015-02-18 NOTE — Consult Note (Signed)
[facility] Infectious Disease     Reason for Consult:S aureus bacteremia, wound    Referring Physician: Estanislado Spire Date of Admission:  02/17/2015   Active Problems:   Hyponatremia   Wound infection ([place])   Colo-enteric fistula   HPI: Gina [name] is a 82 y.o. female with recent admission with R thigh abscess, s/p debridement readmitted with low grade fevers. She is largely bedbound and has dementia. . At last admit wound and bcx + MSSA. We had repeated bcx as otpt and was getting IM ctx pending neg bcx and placement of PICC line.  Has been having diarrhea and has a new R buttock decub ulcer.  CT shows rectocutaneous fistula.  History unable to be obtained from patient  Past Medical History  Diagnosis Date  . Diabetes mellitus without complication ([place])   . Hypertension   . Dementia   . Anxiety   . GERD (gastroesophageal reflux disease)   . Osteoporosis   . Hypothyroidism   . Sacral decubitus ulcer    Past Surgical History  Procedure Laterality Date  . Intramedullary (im) nail intertrochanteric Right 10/21/2014    Procedure: INTRAMEDULLARY (IM) NAIL INTERTROCHANTRIC;  Surgeon: Earnestine Leys, MD;  Location: [facility] ORS;  Service: Orthopedics;  Laterality: Right;  . Debridement and closure wound Right 02/12/2015    Procedure: DEBRIDEMENT AND CLOSURE WOUND;  Surgeon: Corky Mull, MD;  Location: [facility] ORS;  Service: Orthopedics;  Laterality: Right;   Social History  Substance Use Topics  . Smoking status: Never Smoker   . Smokeless tobacco: Never Used  . Alcohol Use: No   Family History  Problem Relation Age of Onset  . Diabetes Daughter     Allergies:  Allergies  Allergen Reactions  . Fosamax [Alendronate Sodium] Other (See Comments)    Reaction: Unknown    Current antibiotics: Antibiotics Given (last 72 hours)    Date/Time Action Medication Dose Rate   02/18/15 0417 Given   piperacillin-tazobactam (ZOSYN) IVPB 3.375 g 3.375 g 12.5 mL/hr   02/18/15 1248 Given    vancomycin (VANCOCIN) IVPB 750 mg/150 ml premix 750 mg 150 mL/hr   02/18/15 1248 Given   piperacillin-tazobactam (ZOSYN) IVPB 3.375 g 3.375 g 12.5 mL/hr      MEDICATIONS: . ALPRAZolam  0.5 mg Oral QHS  . atorvastatin  10 mg Oral q1800  . calcium-vitamin D  1 tablet Oral BID  . clotrimazole   Topical BID  . enoxaparin (LOVENOX) injection  40 mg Subcutaneous QHS  . feeding supplement (ENSURE ENLIVE)  237 mL Oral TID WC  . ferrous sulfate  325 mg Oral BID WC  . fluconazole (DIFLUCAN) IV  200 mg Intravenous Q24H  . levothyroxine  137 mcg Oral QAC breakfast  . magnesium sulfate 1 - 4 g bolus IVPB  4 g Intravenous Once  . metFORMIN  500 mg Oral BID WC  . pantoprazole  40 mg Oral QAC breakfast  . potassium chloride  10 mEq Intravenous Q1 Hr x 4  . sodium chloride  1 g Oral BID WC  . traZODone  50 mg Oral QHS  . verapamil  240 mg Oral Daily  . vitamin C  500 mg Oral Daily  . [START ON 02/19/2015] Vitamin D (Ergocalciferol)  50,000 Units Oral Once per day on Sat    Review of Systems - unable to obtain OBJECTIVE: Temp:  [98.5 F (36.9 C)-100.4 F (38 C)] 98.5 F (36.9 C) (02/17 1245) Pulse Rate:  [60-83] 71 (02/17 1245) Resp:  [  15-24] 24 (02/17 1245) BP: (147-176)/(53-89) 170/65 mmHg (02/17 1245) SpO2:  [96 %-100 %] 96 % (02/17 1245) Weight:  [69.718 kg (153 lb 11.2 oz)-73.891 kg (162 lb 14.4 oz)] 69.718 kg (153 lb 11.2 oz) (02/17 0400) Physical Exam  Constitutional:  Frail, lying in bed HENT: [place]/AT, PERRLA, no scleral icterus Mouth/Throat: Oropharynx is clear and dry.   Cardiovascular: Normal rate, regular rhythm and normal heart sounds.  Pulmonary/Chest: bil rhonchi Neck = supple, no nuchal rigidity Abdominal: Soft. Bowel sounds are normal.  exhibits no distension. There is no tenderness.  Lymphadenopathy: no cervical adenopathy. No axillary adenopathy Neurological:pleasantly demented Skin: R thigh wound nice and clean - no drainage. R buttock with a new 2 cm ulcer with dark  eschar - able ot express thick pus, has diffuse fungal rash there too  LABS: Results for orders placed or performed during the hospital encounter of 02/17/15 (from the past 48 hour(s))  Lactic acid, plasma     Status: None   Collection Time: 02/17/15  8:40 PM  Result Value Ref Range   Lactic Acid, Venous 1.3 0.5 - 2.0 mmol/L  Comprehensive metabolic panel     Status: Abnormal   Collection Time: 02/17/15  8:42 PM  Result Value Ref Range   Sodium 124 (L) 135 - 145 mmol/L   Potassium 3.4 (L) 3.5 - 5.1 mmol/L   Chloride 87 (L) 101 - 111 mmol/L   CO2 22 22 - 32 mmol/L   Glucose, Bld 118 (H) 65 - 99 mg/dL   BUN 16 6 - 20 mg/dL   Creatinine, Ser 0.92 0.44 - 1.00 mg/dL   Calcium 8.4 (L) 8.9 - 10.3 mg/dL   Total Protein 6.5 6.5 - 8.1 g/dL   Albumin 2.5 (L) 3.5 - 5.0 g/dL   AST 59 (H) 15 - 41 U/L   ALT 23 14 - 54 U/L   Alkaline Phosphatase 285 (H) 38 - 126 U/L   Total Bilirubin UNABLE TO REPORT DUE TO QUANTITY OF SAMPLE 0.3 - 1.2 mg/dL    Comment: C/ GRACE [facility] RN AT 2235 02/17/15 MLZ  PER RN REPORT WHAT CAN BE REPORTED AND SHE WILL ORDER THE TOTAL BILIRUBIN SEPARATE     GFR calc non Af Amer 51 (L) >60 mL/min   GFR calc Af Amer 59 (L) >60 mL/min    Comment: (NOTE) The eGFR has been calculated using the CKD EPI equation. This calculation has not been validated in all clinical situations. eGFR's persistently <60 mL/min signify possible Chronic Kidney Disease.    Anion gap 15 5 - 15  CBC WITH DIFFERENTIAL     Status: Abnormal   Collection Time: 02/17/15  8:42 PM  Result Value Ref Range   WBC 7.2 3.6 - 11.0 K/uL   RBC 3.61 (L) 3.80 - 5.20 MIL/uL   Hemoglobin 11.2 (L) 12.0 - 16.0 g/dL   HCT 33.3 (L) 35.0 - 47.0 %   MCV 92.2 80.0 - 100.0 fL   MCH 30.8 26.0 - 34.0 pg   MCHC 33.4 32.0 - 36.0 g/dL   RDW 13.8 11.5 - 14.5 %   Platelets 202 150 - 440 K/uL   Neutrophils Relative % 61 %   Neutro Abs 4.3 1.4 - 6.5 K/uL   Lymphocytes Relative 24 %   Lymphs Abs 1.7 1.0 - 3.6 K/uL    Monocytes Relative 14 %   Monocytes Absolute 1.0 (H) 0.2 - 0.9 K/uL   Eosinophils Relative 1 %   Eosinophils Absolute 0.1 0 -  0.7 K/uL   Basophils Relative 0 %   Basophils Absolute 0.0 0 - 0.1 K/uL  Urine culture     Status: None (Preliminary result)   Collection Time: 02/17/15  9:05 PM  Result Value Ref Range   Specimen Description URINE, RANDOM    Special Requests NONE    Culture NO GROWTH < 12 HOURS    Report Status PENDING   Urinalysis complete, with microscopic ([facility] only)     Status: Abnormal   Collection Time: 02/17/15  9:08 PM  Result Value Ref Range   Color, Urine YELLOW (A) YELLOW   APPearance HAZY (A) CLEAR   Glucose, UA NEGATIVE NEGATIVE mg/dL   Bilirubin Urine NEGATIVE NEGATIVE   Ketones, ur NEGATIVE NEGATIVE mg/dL   Specific Gravity, Urine 1.009 1.005 - 1.030   Hgb urine dipstick NEGATIVE NEGATIVE   pH 7.0 5.0 - 8.0   Protein, ur NEGATIVE NEGATIVE mg/dL   Nitrite NEGATIVE NEGATIVE   Leukocytes, UA TRACE (A) NEGATIVE   RBC / HPF NONE SEEN 0 - 5 RBC/hpf   WBC, UA 6-30 0 - 5 WBC/hpf   Bacteria, UA NONE SEEN NONE SEEN   Squamous Epithelial / LPF 0-5 (A) NONE SEEN  Bilirubin, total     Status: None   Collection Time: 02/17/15 10:34 PM  Result Value Ref Range   Total Bilirubin 0.5 0.3 - 1.2 mg/dL  Basic metabolic panel     Status: Abnormal   Collection Time: 02/18/15  4:40 AM  Result Value Ref Range   Sodium 125 (L) 135 - 145 mmol/L   Potassium 2.9 (LL) 3.5 - 5.1 mmol/L    Comment: CRITICAL RESULT CALLED TO, READ BACK BY AND VERIFIED WITH MARCELLA TURNER AT 0533 ON 02/18/15.Marland KitchenMarland KitchenEagar    Chloride 92 (L) 101 - 111 mmol/L   CO2 23 22 - 32 mmol/L   Glucose, Bld 101 (H) 65 - 99 mg/dL   BUN 12 6 - 20 mg/dL   Creatinine, Ser 0.70 0.44 - 1.00 mg/dL   Calcium 7.7 (L) 8.9 - 10.3 mg/dL   GFR calc non Af Amer >60 >60 mL/min   GFR calc Af Amer >60 >60 mL/min    Comment: (NOTE) The eGFR has been calculated using the CKD EPI equation. This calculation has not been validated  in all clinical situations. eGFR's persistently <60 mL/min signify possible Chronic Kidney Disease.    Anion gap 10 5 - 15  CBC     Status: Abnormal   Collection Time: 02/18/15  4:40 AM  Result Value Ref Range   WBC 6.3 3.6 - 11.0 K/uL   RBC 3.16 (L) 3.80 - 5.20 MIL/uL   Hemoglobin 10.0 (L) 12.0 - 16.0 g/dL   HCT 28.9 (L) 35.0 - 47.0 %   MCV 91.7 80.0 - 100.0 fL   MCH 31.8 26.0 - 34.0 pg   MCHC 34.6 32.0 - 36.0 g/dL   RDW 13.8 11.5 - 14.5 %   Platelets 179 150 - 440 K/uL  Magnesium     Status: Abnormal   Collection Time: 02/18/15  4:40 AM  Result Value Ref Range   Magnesium 1.3 (L) 1.7 - 2.4 mg/dL   No components found for: ESR, C REACTIVE PROTEIN MICRO: Recent Results (from the past 720 hour(s))  Culture, blood (routine x 2)     Status: None   Collection Time: 02/12/15 12:31 AM  Result Value Ref Range Status   Specimen Description BLOOD RIGHT ASSIST CONTROL  Final   Special Requests BOTTLES  DRAWN AEROBIC AND ANAEROBIC [place]  Final   Culture  Setup Time   Final    GRAM POSITIVE COCCI IN BOTH AEROBIC AND ANAEROBIC BOTTLES CRITICAL RESULT CALLED TO, READ BACK BY AND VERIFIED WITH: LISA KLUTTZ 02/12/2015 1545 BY JRS.    Culture   Final    STAPHYLOCOCCUS AUREUS IN BOTH AEROBIC AND ANAEROBIC BOTTLES    Report Status 02/17/2015 FINAL  Final   Organism ID, Bacteria STAPHYLOCOCCUS AUREUS  Final      Susceptibility   Staphylococcus aureus - MIC*    CIPROFLOXACIN 4 RESISTANT Resistant     ERYTHROMYCIN >=8 RESISTANT Resistant     GENTAMICIN <=0.5 SENSITIVE Sensitive     OXACILLIN 0.5 SENSITIVE Sensitive     TETRACYCLINE <=1 SENSITIVE Sensitive     VANCOMYCIN 1 SENSITIVE Sensitive     TRIMETH/SULFA <=10 SENSITIVE Sensitive     CLINDAMYCIN <=0.25 SENSITIVE Sensitive     RIFAMPIN <=0.5 SENSITIVE Sensitive     Inducible Clindamycin NEGATIVE Sensitive     * STAPHYLOCOCCUS AUREUS  Blood Culture ID Panel (Reflexed)     Status: Abnormal   Collection Time: 02/12/15 12:31 AM   Result Value Ref Range Status   Enterococcus species NOT DETECTED NOT DETECTED Final   Listeria monocytogenes NOT DETECTED NOT DETECTED Final   Staphylococcus species DETECTED (A) NOT DETECTED Final    Comment: CRITICAL RESULT CALLED TO, READ BACK BY AND VERIFIED WITH: LISA KLUTTZ 02/12/2015 1545 BY JRS.    Staphylococcus aureus DETECTED (A) NOT DETECTED Final    Comment: CRITICAL RESULT CALLED TO, READ BACK BY AND VERIFIED WITH: LISA KLUTTZ 02/12/2015 1545 BY JRS. DETECTED    Streptococcus species NOT DETECTED NOT DETECTED Final   Streptococcus agalactiae NOT DETECTED NOT DETECTED Final   Streptococcus pneumoniae NOT DETECTED NOT DETECTED Final   Streptococcus pyogenes NOT DETECTED NOT DETECTED Final   Acinetobacter baumannii NOT DETECTED NOT DETECTED Final   Enterobacteriaceae species NOT DETECTED NOT DETECTED Final   Enterobacter cloacae complex NOT DETECTED NOT DETECTED Final   Escherichia coli NOT DETECTED NOT DETECTED Final   Klebsiella oxytoca NOT DETECTED NOT DETECTED Final   Klebsiella pneumoniae NOT DETECTED NOT DETECTED Final   Proteus species NOT DETECTED NOT DETECTED Final   Serratia marcescens NOT DETECTED NOT DETECTED Final   Haemophilus influenzae NOT DETECTED NOT DETECTED Final   Neisseria meningitidis NOT DETECTED NOT DETECTED Final   Pseudomonas aeruginosa NOT DETECTED NOT DETECTED Final   Candida albicans NOT DETECTED NOT DETECTED Final   Candida glabrata NOT DETECTED NOT DETECTED Final   Candida krusei NOT DETECTED NOT DETECTED Final   Candida parapsilosis NOT DETECTED NOT DETECTED Final   Candida tropicalis NOT DETECTED NOT DETECTED Final   Carbapenem resistance NOT DETECTED NOT DETECTED Final   Methicillin resistance NOT DETECTED NOT DETECTED Final   Vancomycin resistance NOT DETECTED NOT DETECTED Final  Culture, blood (routine x 2)     Status: None   Collection Time: 02/12/15 12:36 AM  Result Value Ref Range Status   Specimen Description BLOOD LEFT  ASSIST CONTROL  Final   Special Requests BOTTLES DRAWN AEROBIC AND ANAEROBIC [place]  Final   Culture NO GROWTH 5 DAYS  Final   Report Status 02/17/2015 FINAL  Final  Wound culture     Status: None   Collection Time: 02/12/15 12:46 AM  Result Value Ref Range Status   Specimen Description THIGH  Final   Special Requests Normal  Final   Gram Stain   Final  RARE WBC SEEN MODERATE GRAM POSITIVE COCCI IN CLUSTERS FEW GRAM NEGATIVE RODS    Culture HEAVY GROWTH STAPHYLOCOCCUS AUREUS  Final   Report Status 02/15/2015 FINAL  Final   Organism ID, Bacteria STAPHYLOCOCCUS AUREUS  Final      Susceptibility   Staphylococcus aureus - MIC*    CIPROFLOXACIN >=8 RESISTANT Resistant     ERYTHROMYCIN >=8 RESISTANT Resistant     GENTAMICIN <=0.5 SENSITIVE Sensitive     OXACILLIN 0.5 SENSITIVE Sensitive     TETRACYCLINE <=1 SENSITIVE Sensitive     VANCOMYCIN 1 SENSITIVE Sensitive     TRIMETH/SULFA <=10 SENSITIVE Sensitive     CLINDAMYCIN <=0.25 SENSITIVE Sensitive     RIFAMPIN <=0.5 SENSITIVE Sensitive     Inducible Clindamycin NEGATIVE Sensitive     * HEAVY GROWTH STAPHYLOCOCCUS AUREUS  MRSA PCR Screening     Status: None   Collection Time: 02/12/15  5:38 AM  Result Value Ref Range Status   MRSA by PCR NEGATIVE NEGATIVE Final    Comment:        The GeneXpert MRSA Assay (FDA approved for NASAL specimens only), is one component of a comprehensive MRSA colonization surveillance program. It is not intended to diagnose MRSA infection nor to guide or monitor treatment for MRSA infections.   Anaerobic culture     Status: None   Collection Time: 02/12/15 12:16 PM  Result Value Ref Range Status   Specimen Description WOUND  Final   Special Requests NONE  Final   Culture NO ANAEROBES ISOLATED  Final   Report Status 02/16/2015 FINAL  Final  Wound culture     Status: None   Collection Time: 02/12/15 12:16 PM  Result Value Ref Range Status   Specimen Description WOUND  Final   Special  Requests NONE  Final   Gram Stain   Final    FEW WBC SEEN MODERATE GRAM POSITIVE COCCI IN CLUSTERS    Culture HEAVY GROWTH STAPHYLOCOCCUS AUREUS  Final   Report Status 02/15/2015 FINAL  Final   Organism ID, Bacteria STAPHYLOCOCCUS AUREUS  Final      Susceptibility   Staphylococcus aureus - MIC*    CIPROFLOXACIN >=8 RESISTANT Resistant     ERYTHROMYCIN >=8 RESISTANT Resistant     GENTAMICIN <=0.5 SENSITIVE Sensitive     OXACILLIN 0.5 SENSITIVE Sensitive     TETRACYCLINE <=1 SENSITIVE Sensitive     VANCOMYCIN 1 SENSITIVE Sensitive     TRIMETH/SULFA <=10 SENSITIVE Sensitive     CLINDAMYCIN <=0.25 SENSITIVE Sensitive     RIFAMPIN <=0.5 SENSITIVE Sensitive     Inducible Clindamycin NEGATIVE Sensitive     * HEAVY GROWTH STAPHYLOCOCCUS AUREUS  Urine culture     Status: None   Collection Time: 02/13/15  5:44 PM  Result Value Ref Range Status   Specimen Description URINE, RANDOM  Final   Special Requests NONE  Final   Culture 20,000 COLONIES/mL KLEBSIELLA PNEUMONIAE  Final   Report Status 02/16/2015 FINAL  Final   Organism ID, Bacteria KLEBSIELLA PNEUMONIAE  Final      Susceptibility   Klebsiella pneumoniae - MIC*    AMPICILLIN 16 RESISTANT Resistant     CEFAZOLIN <=4 SENSITIVE Sensitive     CEFTRIAXONE <=1 SENSITIVE Sensitive     CIPROFLOXACIN <=0.25 SENSITIVE Sensitive     GENTAMICIN <=1 SENSITIVE Sensitive     IMIPENEM <=0.25 SENSITIVE Sensitive     NITROFURANTOIN 64 INTERMEDIATE Intermediate     TRIMETH/SULFA <=20 SENSITIVE Sensitive     AMPICILLIN/SULBACTAM  4 SENSITIVE Sensitive     PIP/TAZO <=4 SENSITIVE Sensitive     Extended ESBL NEGATIVE Sensitive     * 20,000 COLONIES/mL KLEBSIELLA PNEUMONIAE  Urine culture     Status: None (Preliminary result)   Collection Time: 02/17/15  9:05 PM  Result Value Ref Range Status   Specimen Description URINE, RANDOM  Final   Special Requests NONE  Final   Culture NO GROWTH < 12 HOURS  Final   Report Status PENDING  Incomplete     IMAGING: Ct Abdomen Pelvis W Contrast  02/17/2015  CLINICAL DATA:  82 year old female with sacral decubitus ulcer and surrounding cellulitis and posturing age. EXAM: CT ABDOMEN AND PELVIS WITH CONTRAST TECHNIQUE: Multidetector CT imaging of the abdomen and pelvis was performed using the standard protocol following bolus administration of intravenous contrast. CONTRAST:  125m OMNIPAQUE IOHEXOL 300 MG/ML  SOLN COMPARISON:  CT dated 01/19/2006 FINDINGS: Partially visualized small bilateral pleural effusions. The visualized lung bases are otherwise clear. There is coronary vascular calcification. No intra-abdominal free air or free fluid. Cirrhosis. Cholecystectomy. There is mild biliary ductal dilatation, likely post cholecystectomy. The pancreas, spleen, and adrenal glands appear unremarkable. Bilateral extrarenal pelvis. There is no hydronephrosis on either side. A 1.8 cm exophytic hypodense lesion arising from the lateral cortex of the left kidney is not well characterized but likely represent cysts. Ultrasound may provide better evaluation. There is no hydronephrosis on either side. 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 The visualized ureters appear unremarkable. There is diffuse thickening and trabecular appearance of the bladder wall compatible with chronic bladder outlet obstruction. There is haziness of the perivesical fat concerning for superimposed infection. Correlation with urinalysis recommended. Underlying infiltrative process is not excluded. Cystoscopy may provide better evaluation if clinically indicated. The catheter and small pockets of air noted within the bladder. Hysterectomy. Oral contrast opacifies multiple loops of small bowel and traverses into the colon without evidence of bowel obstruction. There is sigmoid diverticulosis without active inflammatory changes. There is mild diffuse thickening of the sigmoid colon. A  fistulous tract noted extending from the rectum into the sacral skin surface in the midline where there is a small skin defect. This tract measures approximately 6.6 cm in length and 1.6 cm in width. Contrast noted exiting from the sacral cutaneous defect. There are stranding inflammatory changes of the right gluteal subcutaneous fat. No drainable fluid collection or abscess identified. There is aortoiliac atherosclerotic disease. There is a 2.3 cm infrarenal abdominal aortic ectasia. The abdominal aorta and IVC appear patent. No portal venous gas identified. There is no adenopathy. There is diffuse subcutaneous soft tissue stranding of the abdominal wall. There is osteopenia with degenerative changes of the spine. Right hip orthopedic hardware. There is L2 compression fracture with approximately 50% loss of vertebral body height, age indeterminate, likely chronic. Clinical correlation is recommended. No definite acute fracture identified. IMPRESSION: Relatively large fistulous tract extending from the lower rectum/ anal region into the midline inferior sacral skin ulcer. No drainable fluid collection or abscess identified. Sigmoid diverticulosis without definite active inflammatory changes. Mild thickening of the rectosigmoid may represent mild superimposed inflammation/infection. No evidence of bowel obstruction. Diffuse thickening and trabecular appearance of the bladder wall likely related to chronic bladder outlet obstruction. There is possible superimposed cystitis. Correlation with urinalysis recommended. Cirrhosis. These results were called by telephone at the time of interpretation on 02/17/2015 at 11:34 pm to Dr. DLarae Grooms,  who verbally acknowledged these results. Electronically Signed   By: Anner Crete M.D.   On: 02/17/2015 23:35   Dg Femur, Min 2 Views Right  02/12/2015  CLINICAL DATA:  Acute onset of right thigh wound, thought to reflect a spider bite. Initial encounter. EXAM: RIGHT FEMUR  2 VIEWS COMPARISON:  Right hip radiographs performed 11/06/2014 FINDINGS: There is lucency about the proximal aspect of the patient's right femoral stem, measuring up to 4 mm, concerning for mild loosening. The dynamic hip screw appears to have migrated centrally since the prior study, extending through the right femoral head and impacting on the acetabulum. This likely causes markedly limited range of motion. The previously noted small fracture line through the residual greater femoral trochanter demonstrates mild interval healing. Mild degenerative change is noted at the right knee, with medial chronic narrowing and marginal osteophyte formation. The known soft tissue wound is not well characterized on radiograph. No radiopaque foreign bodies are seen. IMPRESSION: 1. Known soft tissue wound is not well characterized on radiograph. No radiopaque foreign bodies seen. 2. Dynamic hip screw appears to have migrated centrally since the prior study, now extending through the right femoral head and impacting on the acetabulum. This likely causes markedly limited range of motion. 3. Lucency about the proximal aspect of the right femoral stem, measuring up to 4 mm, concerning for mild loosening. This appears to be new from the prior study. These results were called by telephone at the time of interpretation on 02/12/2015 at 12:50 am to Dr. Lurline Hare, who verbally acknowledged these results. Electronically Signed   By: Garald Balding M.D.   On: 02/12/2015 00:53    Assessment:   KIMYETTA FLOTT is a 82 y.o. female with R thigh abscess s/p I and D with bcx wound cx MSSA also now with R gluteal/sacral decub which seems to communicate with the colon via a rectocutaneous fistula.   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 Surgery does not feel she is a candidate for colostomy. Can treat with IV abx but will not cure the fistula tract.  Would consult palliative care.     Thank you very much for allowing me to participate in the care of this patient. Please call with questions.   Cheral Marker. Ola Spurr, MD

## 2015-02-18 NOTE — Clinical Social Work Note (Signed)
Clinical Social Work Assessment  Patient Details  Name: Gina [name] MRN: [id number] Date of Birth: Jan 29, 1917  Date of referral:  02/18/15               Reason for consult:   (from [employer] [facility])                Permission sought to share information with:  Facility Medical sales representative, Family Supports Permission granted to share information::  Yes, Verbal Permission Granted  Name::      (daughter Gina [name]  [id number])  Agency::     Relationship::     Contact Information:     Housing/Transportation Living arrangements for the past 2 months:  Assisted Dealer of Information:  Medical Team, Adult Children, Patient, Facility Patient Interpreter Needed:  None Criminal Activity/Legal Involvement Pertinent to Current Situation/Hospitalization:    Significant Relationships:  Adult Children, Merchandiser, retail Lives with:  Facility Resident Do you feel safe going back to the place where you live?    Need for family participation in patient care:  Yes (Comment)  Care giving concerns:  None at this time   Office manager / plan: Visual merchandiser (CSW) consult patient is from [facility] for IV Anti. Patient was alert, disoriented due to dementis (Additional Information provided by daughter and facility). CSW introduced self and explained role of CSW department.   Patient has lived at [facility] for the past 10 years. Recently moved to memory care this past year. Daughter Gina [name] is her primary support. States patient is primary in a wheelchair at the facility and they assist her with her ADL. Due to IV anti  Patient was discharged to [facility].  Daughter plans for her to return to [facility] at discharge if she still needs the IV Anti.  If not, per daughter would like for patient to return to [facility].  CSW will complete FL2 in anticipation of patient returning to [facility] at discharge.  Employment status:    Insurance  informationAdvertising account executive PT Recommendations:  Skilled Nursing Facility Information / Referral to community resources:  Skilled Nursing Facility  Patient/Family's Response to care:  Patient's daughter was appreciative of talking with CSW.  Patient/Family's Understanding of and Emotional Response to Diagnosis, Current Treatment, and Prognosis:  Patient's daughter understands that patient is under continued medical work up at this time.  Once medically stable she will discharge back to [facility].  Emotional Assessment Appearance:  Appears stated age Attitude/Demeanor/Rapport:    Affect (typically observed):  Calm, Pleasant Orientation:  Oriented to Self, Oriented to Place Alcohol / Substance use:    Psych involvement (Current and /or in the community):  No (Comment)  Discharge Needs  Concerns to be addressed:  Care Coordination, Discharge Planning Concerns Readmission within the last 30 days:  Yes Current discharge risk:  Chronically ill, Cognitively Impaired, Dependent with Mobility Barriers to Discharge:  Continued Medical Work up   [employer], LCSW 02/18/2015, 1:40 PM

## 2015-02-18 NOTE — Progress Notes (Signed)
Dr. Tobi Bastos notified of critical K+ of 2.9; New orders written. Windy Carina, RN 5:34 AM 02/18/2015

## 2015-02-18 NOTE — [place] FL2 (Signed)
[place] MEDICAID FL2 LEVEL OF CARE SCREENING TOOL     IDENTIFICATION  Patient Name: Gina [name] Birthdate: 07-14-1917 Sex: female Admission Date (Current Location): 02/17/2015  [place] and [place] Number:  Chiropodist and Address:  [facility], [address], [place], [place] [address]      Provider Number: [id number]  Attending Physician Name and Address:  Shaune Pollack, MD  Relative Name and Phone Number:       Current Level of Care: Hospital Recommended Level of Care: Skilled Nursing Facility Prior Approval Number:    Date Approved/Denied:   PASRR Number: [id number]  Discharge Plan: SNF    Current Diagnoses: Patient Active Problem List   Diagnosis Date Noted  . Hyponatremia 02/18/2015  . Wound infection (HCC) 02/18/2015  . Cellulitis 02/12/2015  . Hip fracture (HCC) 10/20/2014  . Pressure ulcer 10/20/2014  . HYPOTHYROIDISM 04/10/2006  . DIABETES MELLITUS, WITH GASTROPARESIS 04/10/2006  . HYPERCHOLESTEROLEMIA 04/10/2006  . DEMENTIA 04/10/2006  . ANXIETY 04/10/2006  . Essential hypertension 04/10/2006  . ATHEROSCLEROTIC CARDIOVASCULAR DISEASE 04/10/2006  . GERD 04/10/2006  . CONSTIPATION 04/10/2006  . DIABETES MELLITUS, TYPE II 05/09/2005    Orientation RESPIRATION BLADDER Height & Weight     Self, Place  Normal Incontinent Weight: 153 lb 11.2 oz (69.718 kg) Height:   (162.6 cm)  BEHAVIORAL SYMPTOMS/MOOD NEUROLOGICAL BOWEL NUTRITION STATUS      Incontinent Diet, Supplemental (Carb modified)  AMBULATORY STATUS COMMUNICATION OF NEEDS Skin   Extensive Assist Verbally PU Stage and Appropriate Care, Surgical wounds                       Personal Care Assistance Level of Assistance  Bathing, Feeding, Dressing Bathing Assistance: Maximum assistance Feeding assistance: Limited assistance Dressing Assistance: Maximum assistance     Functional Limitations Info  Sight, Hearing, Speech Sight Info:  Adequate Hearing Info: Adequate Speech Info: Adequate    SPECIAL CARE FACTORS FREQUENCY                       Contractures      Additional Factors Info  Code Status, Allergies Code Status Info: DNR Allergies Info: Fosamax           Current Medications (02/18/2015):  This is the current hospital active medication list Current Facility-Administered Medications  Medication Dose Route Frequency Provider Last Rate Last Dose  . 0.9 %  sodium chloride infusion   Intravenous Continuous Ihor Austin, MD 75 mL/hr at 02/18/15 0417    . acetaminophen (TYLENOL) tablet 325 mg  325 mg Oral Q4H PRN Cindi Carbon, [facility]      . ALPRAZolam (XANAX) tablet 0.25 mg  0.25 mg Oral BID PRN Ihor Austin, MD      . ALPRAZolam Prudy Feeler) tablet 0.5 mg  0.5 mg Oral QHS Ihor Austin, MD   0.5 mg at 02/18/15 0145  . atorvastatin (LIPITOR) tablet 10 mg  10 mg Oral q1800 Pavan Pyreddy, MD      . calcium-vitamin D (OSCAL WITH D) 500-200 MG-UNIT per tablet 1 tablet  1 tablet Oral BID Ihor Austin, MD   1 tablet at 02/18/15 1106  . enoxaparin (LOVENOX) injection 40 mg  40 mg Subcutaneous QHS Ihor Austin, MD   40 mg at 02/18/15 0417  . feeding supplement (ENSURE ENLIVE) (ENSURE ENLIVE) liquid 237 mL  237 mL Oral TID WC Shaune Pollack, MD   237 mL at 02/18/15 1253  . ferrous  sulfate tablet 325 mg  325 mg Oral BID WC Ihor Austin, MD   325 mg at 02/18/15 1106  . HYDROcodone-acetaminophen (NORCO/VICODIN) 5-325 MG per tablet 1 tablet  1 tablet Oral Q6H PRN Pavan Pyreddy, MD      . levothyroxine (SYNTHROID, LEVOTHROID) tablet 137 mcg  137 mcg Oral QAC breakfast Ihor Austin, MD   137 mcg at 02/18/15 1106  . magnesium sulfate IVPB 4 g 100 mL  4 g Intravenous Once Shaune Pollack, MD      . Menthol-Zinc Oxide 0.2-20 % PSTE 1 application  1 application Apply externally PRN Ihor Austin, MD      . metFORMIN (GLUCOPHAGE) tablet 500 mg  500 mg Oral BID WC Ihor Austin, MD   500 mg at 02/18/15 1107  . ondansetron (ZOFRAN)  tablet 4 mg  4 mg Oral Q6H PRN Ihor Austin, MD       Or  . ondansetron (ZOFRAN) injection 4 mg  4 mg Intravenous Q6H PRN Pavan Pyreddy, MD      . pantoprazole (PROTONIX) EC tablet 40 mg  40 mg Oral QAC breakfast Ihor Austin, MD   40 mg at 02/18/15 1105  . piperacillin-tazobactam (ZOSYN) IVPB 3.375 g  3.375 g Intravenous 3 times per day Myrna Blazer, MD   3.375 g at 02/18/15 1248  . senna (SENOKOT) tablet 8.6 mg  1 tablet Oral Daily PRN Pavan Pyreddy, MD      . sodium chloride (OCEAN) 0.65 % nasal spray 2 spray  2 spray Each Nare Q6H PRN Pavan Pyreddy, MD      . sodium chloride tablet 1 g  1 g Oral BID WC Ihor Austin, MD   1 g at 02/18/15 1247  . traZODone (DESYREL) tablet 50 mg  50 mg Oral QHS Pavan Pyreddy, MD      . vancomycin (VANCOCIN) IVPB 750 mg/150 ml premix  750 mg Intravenous Q24H Myrna Blazer, MD   750 mg at 02/18/15 1248  . verapamil (CALAN-SR) CR tablet 240 mg  240 mg Oral Daily Ihor Austin, MD   240 mg at 02/18/15 1105  . vitamin C (ASCORBIC ACID) tablet 500 mg  500 mg Oral Daily Ihor Austin, MD   500 mg at 02/18/15 1105  . [START ON 02/19/2015] Vitamin D (Ergocalciferol) (DRISDOL) capsule 50,000 Units  50,000 Units Oral Once per day on Sat Ihor Austin, MD         Discharge Medications: Please see discharge summary for a list of discharge medications.  Relevant Imaging Results:  Relevant Lab Results:   Additional Information SS: [id number]         (IV Anti)  Soundra Pilon, LCSW

## 2015-02-18 NOTE — H&P (Signed)
[facility] - [place] at [facility]   PATIENT NAME: Gina [name]    MR#:  [id number]  DATE OF BIRTH:  [birth year]  DATE OF ADMISSION:  02/17/2015  PRIMARY CARE PHYSICIAN: No primary care provider on file.   REQUESTING/REFERRING PHYSICIAN:   CHIEF COMPLAINT:   Chief Complaint  Patient presents with  . Wound Infection    HISTORY OF PRESENT ILLNESS: Gina [name]  is a 82 y.o. female with a known history of necrotic ulcer in the right thigh extending in the sacral area, diabetes mellitus, hypertension, GERD, osteoporosis was recently discharged from our hospital after being managed for cellulitis of the thigh. Patient was discharged to nursing home. She received IV vancomycin and Zosyn antibiotics and was discharged on Rocephin antibiotic intramuscularly after cultures grew methicillin sensitive staph aureus. Patient was again referred from facility today for low-grade fever. Patient has chronic hyponatremia and currently on salt tablets. She is mostly bedbound and has a right hip surgery with mobile hardware and hence does not bear any weight. No history of any nausea or vomiting. No abdominal pain. No history of fall. Patient was worked up with a CT abdomen which showed rectocutaneous fistula, case was discussed ER doctor with surgical attending were advised antibiotics and admission.  PAST MEDICAL HISTORY:   Past Medical History  Diagnosis Date  . Diabetes mellitus without complication (HCC)   . Hypertension   . Dementia   . Anxiety   . GERD (gastroesophageal reflux disease)   . Osteoporosis   . Hypothyroidism   . Sacral decubitus ulcer     PAST SURGICAL HISTORY: Past Surgical History  Procedure Laterality Date  . Intramedullary (im) nail intertrochanteric Right 10/21/2014    Procedure: INTRAMEDULLARY (IM) NAIL INTERTROCHANTRIC;  Surgeon: Deeann Saint, MD;  Location: [facility] ORS;  Service: Orthopedics;  Laterality: Right;  . Debridement and closure  wound Right 02/12/2015    Procedure: DEBRIDEMENT AND CLOSURE WOUND;  Surgeon: Christena Flake, MD;  Location: [facility] ORS;  Service: Orthopedics;  Laterality: Right;    SOCIAL HISTORY:  Social History  Substance Use Topics  . Smoking status: Never Smoker   . Smokeless tobacco: Never Used  . Alcohol Use: No    FAMILY HISTORY:  Family History  Problem Relation Age of Onset  . Diabetes Daughter     DRUG ALLERGIES:  Allergies  Allergen Reactions  . Fosamax [Alendronate Sodium] Other (See Comments)    Reaction: Unknown    REVIEW OF SYSTEMS:   CONSTITUTIONAL: low grade fever, has weakness.  EYES: No blurred or double vision.  EARS, NOSE, AND THROAT: No tinnitus or ear pain.  RESPIRATORY: No cough, shortness of breath, wheezing or hemoptysis.  CARDIOVASCULAR: No chest pain, orthopnea, edema.  GASTROINTESTINAL: No nausea, vomiting, diarrhea or abdominal pain.  GENITOURINARY: No dysuria, hematuria.  ENDOCRINE: No polyuria, nocturia,  HEMATOLOGY: No anemia, easy bruising or bleeding SKIN: necrotic right thigh and sacral ulcer  MUSCULOSKELETAL: No joint pain or arthritis.   NEUROLOGIC: No tingling, numbness, weakness.  PSYCHIATRY: No anxiety or depression.   MEDICATIONS AT HOME:  Prior to Admission medications   Medication Sig Start Date End Date Taking? Authorizing Provider  acetaminophen (TYLENOL) 325 MG tablet Take by mouth every 4 (four) hours as needed for mild pain, moderate pain or fever.   Yes Historical Provider, MD  ALPRAZolam (XANAX) 0.25 MG tablet Take 1 tablet (0.25 mg total) by mouth 2 (two) times daily as needed for anxiety. 02/14/15  Yes Shaune Pollack, MD  ALPRAZolam (XANAX) 0.5 MG tablet Take 1 tablet (0.5 mg total) by mouth at bedtime. 02/14/15  Yes Shaune Pollack, MD  atorvastatin (LIPITOR) 10 MG tablet Take 10 mg by mouth daily at 6 PM.    Yes Historical Provider, MD  calcium-vitamin D (OSCAL WITH D) 500-200 MG-UNIT tablet Take 1 tablet by mouth 2 (two) times daily. 10/22/14   Yes Altamese Dilling, MD  cefTRIAXone (ROCEPHIN) 1 g injection Inject 2 g into the muscle daily. 02/15/15  Yes Clydie Braun, MD  cephALEXin (KEFLEX) 500 MG capsule Take 1 capsule (500 mg total) by mouth 4 (four) times daily. 02/14/15  Yes Shaune Pollack, MD  Cholecalciferol (VITAMIN D3) 50000 units CAPS Take 1 capsule by mouth once a week. Takes on Saturday   Yes Historical Provider, MD  ferrous sulfate 325 (65 FE) MG tablet Take 1 tablet (325 mg total) by mouth 2 (two) times daily with a meal. 10/22/14  Yes Altamese Dilling, MD  HYDROcodone-acetaminophen (NORCO/VICODIN) 5-325 MG tablet Take 1 tablet by mouth every 6 (six) hours as needed for moderate pain. 02/14/15  Yes Shaune Pollack, MD  levothyroxine (SYNTHROID, LEVOTHROID) 137 MCG tablet Take 137 mcg by mouth daily before breakfast.   Yes Historical Provider, MD  loperamide (IMODIUM) 2 MG capsule Take 2 mg by mouth every 4 (four) hours as needed for diarrhea or loose stools.    Yes Historical Provider, MD  Menthol-Zinc Oxide (REMEDY CALAZIME) 0.2-20 % PSTE Apply 1 application topically as needed (skin irritation). Apply to excoriated areas on sacrum   Yes Historical Provider, MD  metFORMIN (GLUCOPHAGE) 500 MG tablet Take 500 mg by mouth 2 (two) times daily with a meal.   Yes Historical Provider, MD  nystatin cream (MYCOSTATIN) Apply 1 application topically as needed (rash). Apply to groin   Yes Historical Provider, MD  omeprazole (PRILOSEC) 20 MG capsule Take 20 mg by mouth daily.   Yes Historical Provider, MD  ranitidine (ZANTAC) 150 MG tablet Take 150 mg by mouth at bedtime.   Yes Historical Provider, MD  senna (SENOKOT) 8.6 MG TABS tablet Take 1 tablet (8.6 mg total) by mouth daily as needed for mild constipation. 10/22/14  Yes Altamese Dilling, MD  sodium chloride (OCEAN) 0.65 % SOLN nasal spray Place 2 sprays into both nostrils every 6 (six) hours as needed for congestion.    Yes Historical Provider, MD  sodium chloride 1 G tablet Take  1 g by mouth 2 (two) times daily with a meal.   Yes Historical Provider, MD  traZODone (DESYREL) 50 MG tablet Take 50 mg by mouth at bedtime.   Yes Historical Provider, MD  verapamil (VERELAN PM) 240 MG 24 hr capsule Take 240 mg by mouth daily.    Yes Historical Provider, MD  vitamin C (ASCORBIC ACID) 500 MG tablet Take 500 mg by mouth daily.   Yes Historical Provider, MD      PHYSICAL EXAMINATION:   VITAL SIGNS: Blood pressure 156/89, pulse 70, temperature 100.4 F (38 C), temperature source Rectal, resp. rate 18, weight 73.891 kg (162 lb 14.4 oz), SpO2 97 %.  GENERAL:  83 y.o.-year-old patient lying in the bed with no acute distress.  EYES: Pupils equal, round, reactive to light and accommodation. No scleral icterus. Extraocular muscles intact.  HEENT: Head atraumatic, normocephalic. Oropharynx and nasopharynx clear.  NECK:  Supple, no jugular venous distention. No thyroid enlargement, no tenderness.  LUNGS: Normal breath sounds bilaterally, no wheezing, rales,rhonchi or crepitation. No use of accessory muscles of respiration.  CARDIOVASCULAR: S1, S2 normal. No murmurs, rubs, or gallops.  ABDOMEN: Soft, nontender, nondistended. Bowel sounds present. No organomegaly or mass.  EXTREMITIES: No pedal edema, cyanosis, or clubbing.  NEUROLOGIC: Cranial nerves II through XII are intact. Muscle strength 5/5 in all extremities. Sensation intact. Gait not checked.  PSYCHIATRIC: The patient is alert and oriented x 3.  SKIN:sacral ulcer noted,necrotic exudate noted,cutaneous fistula tract noted.Marland Kitchen   LABORATORY PANEL:   CBC  Recent Labs Lab 02/12/15 0012 02/12/15 0746 02/13/15 0608 02/14/15 0509 02/17/15 2042  WBC 25.4* 18.2* 16.5* 12.8* 7.2  HGB 10.9* 10.9* 10.0* 9.6* 11.2*  HCT 32.7* 32.3* 29.3* 28.1* 33.3*  PLT 187 180 188 198 202  MCV 93.8 92.3 92.2 91.0 92.2  MCH 31.2 31.2 31.4 31.2 30.8  MCHC 33.3 33.8 34.1 34.3 33.4  RDW 13.5 14.1 14.1 13.7 13.8  LYMPHSABS 3.4  --  2.6  --  1.7   MONOABS 3.0*  --  1.5*  --  1.0*  EOSABS 0.2  --  0.3  --  0.1  BASOSABS 0.1  --  0.0  --  0.0   ------------------------------------------------------------------------------------------------------------------  Chemistries   Recent Labs Lab 02/12/15 0012 02/12/15 0746 02/13/15 0608 02/14/15 0509 02/17/15 2042 02/17/15 2234  NA 120* 124* 125* 126* 124*  --   K 5.3* 4.6 4.2 4.5 3.4*  --   CL 90* 93* 92* 94* 87*  --   CO2 21* 23 28 29 22   --   GLUCOSE 202* 130* 115* 105* 118*  --   BUN 27* 25* 21* 18 16  --   CREATININE 1.20* 1.05* 0.94 0.87 0.92  --   CALCIUM 9.5 9.3 8.7* 8.7* 8.4*  --   MG  --   --   --  1.2*  --   --   AST  --   --   --   --  59*  --   ALT  --   --   --   --  23  --   ALKPHOS  --   --   --   --  285*  --   BILITOT  --   --   --   --  UNABLE TO REPORT DUE TO QUANTITY OF SAMPLE 0.5   ------------------------------------------------------------------------------------------------------------------ estimated creatinine clearance is 36 mL/min (by C-G formula based on Cr of 0.92). ------------------------------------------------------------------------------------------------------------------ No results for input(s): TSH, T4TOTAL, T3FREE, THYROIDAB in the last 72 hours.  Invalid input(s): FREET3   Coagulation profile No results for input(s): INR, PROTIME in the last 168 hours. ------------------------------------------------------------------------------------------------------------------- No results for input(s): DDIMER in the last 72 hours. -------------------------------------------------------------------------------------------------------------------  Cardiac Enzymes No results for input(s): CKMB, TROPONINI, MYOGLOBIN in the last 168 hours.  Invalid input(s): CK ------------------------------------------------------------------------------------------------------------------ Invalid input(s):  POCBNP  ---------------------------------------------------------------------------------------------------------------  Urinalysis    Component Value Date/Time   COLORURINE YELLOW* 02/17/2015 2108   COLORURINE Yellow 09/25/2013 1743   APPEARANCEUR HAZY* 02/17/2015 2108   APPEARANCEUR Hazy 09/25/2013 1743   LABSPEC 1.009 02/17/2015 2108   LABSPEC 1.011 09/25/2013 1743   PHURINE 7.0 02/17/2015 2108   PHURINE 6.0 09/25/2013 1743   GLUCOSEU NEGATIVE 02/17/2015 2108   GLUCOSEU 50 mg/dL 19/14/7829 5621   HGBUR NEGATIVE 02/17/2015 2108   HGBUR Negative 09/25/2013 1743   BILIRUBINUR NEGATIVE 02/17/2015 2108   BILIRUBINUR Negative 09/25/2013 1743   KETONESUR NEGATIVE 02/17/2015 2108   KETONESUR Negative 09/25/2013 1743   PROTEINUR NEGATIVE 02/17/2015 2108   PROTEINUR 30 mg/dL 30/86/5784 6962   NITRITE NEGATIVE 02/17/2015 2108  NITRITE Negative 09/25/2013 1743   LEUKOCYTESUR TRACE* 02/17/2015 2108   LEUKOCYTESUR 3+ 09/25/2013 1743     RADIOLOGY: Ct Abdomen Pelvis W Contrast  02/17/2015  CLINICAL DATA:  82 year old female with sacral decubitus ulcer and surrounding cellulitis and posturing age. EXAM: CT ABDOMEN AND PELVIS WITH CONTRAST TECHNIQUE: Multidetector CT imaging of the abdomen and pelvis was performed using the standard protocol following bolus administration of intravenous contrast. CONTRAST:  OMNIPAQUE IOHEXOL 300 MG/ML  SOLN COMPARISON:  CT dated 01/19/2006 FINDINGS: Partially visualized small bilateral pleural effusions. The visualized lung bases are otherwise clear. There is coronary vascular calcification. No intra-abdominal free air or free fluid. Cirrhosis. Cholecystectomy. There is mild biliary ductal dilatation, likely post cholecystectomy. The pancreas, spleen, and adrenal glands appear unremarkable. Bilateral extrarenal pelvis. There is no hydronephrosis on either side. A 1.8 cm exophytic hypodense lesion arising from the lateral cortex of the left kidney is not  well characterized but likely represent cysts. Ultrasound may provide better evaluation. There is no hydronephrosis on either side. 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 The visualized ureters appear unremarkable. There is diffuse thickening and trabecular appearance of the bladder wall compatible with chronic bladder outlet obstruction. There is haziness of the perivesical fat concerning for superimposed infection. Correlation with urinalysis recommended. Underlying infiltrative process is not excluded. Cystoscopy may provide better evaluation if clinically indicated. The catheter and small pockets of air noted within the bladder. Hysterectomy. Oral contrast opacifies multiple loops of small bowel and traverses into the colon without evidence of bowel obstruction. There is sigmoid diverticulosis without active inflammatory changes. There is mild diffuse thickening of the sigmoid colon. A fistulous tract noted extending from the rectum into the sacral skin surface in the midline where there is a small skin defect. This tract measures approximately 6.6 cm in length and 1.6 cm in width. Contrast noted exiting from the sacral cutaneous defect. There are stranding inflammatory changes of the right gluteal subcutaneous fat. No drainable fluid collection or abscess identified. There is aortoiliac atherosclerotic disease. There is a 2.3 cm infrarenal abdominal aortic ectasia. The abdominal aorta and IVC appear patent. No portal venous gas identified. There is no adenopathy. There is diffuse subcutaneous soft tissue stranding of the abdominal wall. There is osteopenia with degenerative changes of the spine. Right hip orthopedic hardware. There is L2 compression fracture with approximately 50% loss of vertebral body height, age indeterminate, likely chronic. Clinical correlation is recommended. No definite acute fracture identified. IMPRESSION: Relatively  large fistulous tract extending from the lower rectum/ anal region into the midline inferior sacral skin ulcer. No drainable fluid collection or abscess identified. Sigmoid diverticulosis without definite active inflammatory changes. Mild thickening of the rectosigmoid may represent mild superimposed inflammation/infection. No evidence of bowel obstruction. Diffuse thickening and trabecular appearance of the bladder wall likely related to chronic bladder outlet obstruction. There is possible superimposed cystitis. Correlation with urinalysis recommended. Cirrhosis. These results were called by telephone at the time of interpretation on 02/17/2015 at 11:34 pm to Dr. Gladstone Pih , who verbally acknowledged these results. Electronically Signed   By: Elgie Collard M.D.   On: 02/17/2015 23:35    EKG: Orders placed or performed during the hospital encounter of 10/20/14  . EKG 12-Lead  . EKG 12-Lead    IMPRESSION AND PLAN: 82 year old female patient with history of diabetes mellitus,, hypertension,, osteoporosis,, dementia, sacral ulcer with necrosis was referred from nursing home for  fever. Patient does a low-sodium and currently on salt tablets at nursing home.  Admitting diagnosis 1. Rectocutaneous fistula 2. Necrotic sacral ulcer 3. Hyponatremia 4. Hypertension 5. Type 2 diabetes mellitus  Treatment plan Surgical consult IV Rocephin antibiotic based on recent wound culture sensitivity Wound care consult Reason fluid hydration with normal saline Salt tablets orally F/U sodium level Supportive care.  All the records are reviewed and case discussed with ED provider. Management plans discussed with the patient, family and they are in agreement.  CODE STATUS:DNR Code Status History    Date Active Date Inactive Code Status Order ID Comments User Context   02/12/2015  3:36 AM 02/14/2015  5:47 PM DNR [id number]  Milagros Loll, MD ED   10/21/2014  1:26 PM 10/25/2014  3:38 PM DNR [id number]   Deeann Saint, MD Inpatient   10/20/2014  6:43 PM 10/21/2014  1:26 PM DNR [id number]  Ramonita Lab, MD Inpatient   10/20/2014  3:17 PM 10/20/2014  6:43 PM Full Code [id number]  Deeann Saint, MD Inpatient    Questions for Most Recent Historical Code Status (Order [id number])    Question Answer Comment   In the event of cardiac or respiratory ARREST Do not call a "code blue"    In the event of cardiac or respiratory ARREST Do not perform Intubation, CPR, defibrillation or ACLS    In the event of cardiac or respiratory ARREST Use medication by any route, position, wound care, and other measures to relive pain and suffering. May use oxygen, suction and manual treatment of airway obstruction as needed for comfort.        TOTAL TIME TAKING CARE OF THIS PATIENT: 50 minutes.    Ihor Austin M.D on 02/18/2015 at 1:11 AM  Between 7am to 6pm - Pager - [phone]  After 6pm go to [email or web address] - password [email or web address] [facility]  Office  [phone]  CC: Primary care physician; No primary care provider on file.

## 2015-02-18 NOTE — Progress Notes (Signed)
MEDICATION RELATED CONSULT NOTE - INITIAL   Pharmacy Consult for electrolyte management Indication: hypokalemia, hypomagnesemia  Allergies  Allergen Reactions  . Fosamax [Alendronate Sodium] Other (See Comments)    Reaction: Unknown    Patient Measurements: Height:  (162.6 cm) Weight: 153 lb 11.2 oz (69.718 kg) IBW/kg (Calculated) : 54.7  Vital Signs: Temp: 98.5 F (36.9 C) (02/17 1245) Temp Source: Oral (02/17 1245) BP: 170/65 mmHg (02/17 1245) Pulse Rate: 71 (02/17 1245) Intake/Output from previous day: 02/16 0701 - 02/17 0700 In: 450 [P.O.:240; I.V.:180; IV Piggyback:30] Out: 1400 [Urine:1400] Intake/Output from this shift: Total I/O In: 360 [P.O.:360] Out: 1000 [Urine:1000]  Labs:  Recent Labs  02/17/15 2042 02/17/15 2234 02/18/15 0440  WBC 7.2  --  6.3  HGB 11.2*  --  10.0*  HCT 33.3*  --  28.9*  PLT 202  --  179  CREATININE 0.92  --  0.70  MG  --   --  1.3*  ALBUMIN 2.5*  --   --   PROT 6.5  --   --   AST 59*  --   --   ALT 23  --   --   ALKPHOS 285*  --   --   BILITOT UNABLE TO REPORT DUE TO QUANTITY OF SAMPLE 0.5  --    Estimated Creatinine Clearance: 40.3 mL/min (by C-G formula based on Cr of 0.7).  Assessment: Pharmacy consulted to manage electrolytes in this 82 year old female.  K low this AM at 2.9 Mg low at 1.3  Goal of Therapy:  Electrolytes within normal limits  Plan:  Patient received K-Dur 40 mEq PO x1 & magnesium 4 g IV x1 dose this morning Will order KCl 10 mEq IV x 4 runs Will check potassium at 2300 after IV repletion  Ordered K/Mg/Phos with AM labs tomorrow.  Thank you for the consult.  Cindi Carbon, PharmD Clinical Pharmacist 02/18/2015,4:04 PM

## 2015-02-18 NOTE — Consult Note (Addendum)
WOC wound consult note Reason for Consult:Nonhealing wound right trochanter, surgical site.  Right buttock lesion, thought to be a spider bite.  100% adherent slough plug to wound bed with purulence draining from perimeter.  Incontinence Associated Dermatitis to sacrum, heavy liquid stool. Will order mattress replacement with low air loss feature.  Wound type: Nonhealing chronic wound Pressure Ulcer POA: Yes Measurement: Right buttocks 3 cm x 3 cm 100% slough to wound bed Right hip 4 cm x 4.4 cm x 2 cm Wound bed:hip pale pink Drainage (amount, consistency, odor) right buttocks purulent  Minimal serosanguinous drainage from right hip Periwound:Erythema to right buttocks and sacrum denuded from incontinence Dressing procedure/placement/frequency:Cleanse wound right buttocks with soap and water and pat dry.  Cover with Allevyn sacral dressing.  Change every other day and PRN soilage. IF diarrhea persists, discontinue sacral dressing and cover with 4x4 gauze and tape.   Cleanse wound to right hip with NS and pat gently dry.  Gently fill wound bed with calcium alginate cover with ABD pad and tape.  Change daily.  Will not follow at this time.  Please re-consult if needed.  Maple Hudson RN BSN CWON Pager [phone]

## 2015-02-18 NOTE — Progress Notes (Signed)
Initial Nutrition Assessment   INTERVENTION:   Meals and Snacks: Cater to patient preferences; may need to liberalize diet to regular or dysphagia III if needed Medical Food Supplement Therapy: will recommend Ensure Enlive po TID, each supplement provides 350 kcal and 20 grams of protein and will monitor blood glucose   NUTRITION DIAGNOSIS:   Inadequate oral intake related to poor appetite as evidenced by per patient/family report.  GOAL:   Patient will meet greater than or equal to 90% of their needs  MONITOR:    (Energy Intake, Electrolyte and renal Profile, Anthropometrics, Skin, Glucose Profile)  REASON FOR ASSESSMENT:   Malnutrition Screening Tool    ASSESSMENT:   Pt admitted with infected wound with recent discharge to 2/13. Pt currently on isolation. Pt with chronic hyponatremia.  Past Medical History  Diagnosis Date  . Diabetes mellitus without complication (HCC)   . Hypertension   . Dementia   . Anxiety   . GERD (gastroesophageal reflux disease)   . Osteoporosis   . Hypothyroidism   . Sacral decubitus ulcer      Diet Order:  Diet Carb Modified Fluid consistency:: Thin; Room service appropriate?: Yes   Current Nutrition: Pt ate 15% of french toast this am. Per family member pt with poor appetite and has been asking for an Ensure instead of eating.  Food/Nutrition-Related History: Pt family member reports pt has had poor po intake 'for a while' especially since pt has had multiple admissions to hospital. Pt family member reports pt does well with Ensure supplements however.    Scheduled Medications:  . ALPRAZolam  0.5 mg Oral QHS  . atorvastatin  10 mg Oral q1800  . calcium-vitamin D  1 tablet Oral BID  . enoxaparin (LOVENOX) injection  40 mg Subcutaneous QHS  . feeding supplement (ENSURE ENLIVE)  237 mL Oral TID WC  . ferrous sulfate  325 mg Oral BID WC  . levothyroxine  137 mcg Oral QAC breakfast  . metFORMIN  500 mg Oral BID WC  . pantoprazole  40  mg Oral QAC breakfast  . piperacillin-tazobactam (ZOSYN)  IV  3.375 g Intravenous 3 times per day  . sodium chloride  1 g Oral BID WC  . traZODone  50 mg Oral QHS  . vancomycin  750 mg Intravenous Q24H  . verapamil  240 mg Oral Daily  . vitamin C  500 mg Oral Daily  . [START ON 02/19/2015] Vitamin D (Ergocalciferol)  50,000 Units Oral Once per day on Sat    Continuous Medications:  . sodium chloride 75 mL/hr at 02/18/15 0417     Electrolyte/Renal Profile and Glucose Profile:   Recent Labs Lab 02/14/15 0509 02/17/15 2042 02/18/15 0440  NA 126* 124* 125*  K 4.5 3.4* 2.9*  CL 94* 87* 92*  CO2 BUN CREATININE 0.87 0.92 0.70  CALCIUM 8.7* 8.4* 7.7*  MG 1.2*  --  1.3*  GLUCOSE 105* 118* 101*   Protein Profile:  Recent Labs Lab 02/17/15 2042  ALBUMIN 2.5*    Gastrointestinal Profile: Last BM:  02/18/2015   Nutrition-Focused Physical Exam Findings:  Unable to complete Nutrition-Focused physical exam at this time.    Weight Change: Per family member, pt was 155lbs last week, RD measured pt at 155.2lbs on visit today.    Skin:   Unstageable wound on thigh, WOC following   Height:   Ht Readings from Last 1 Encounters:  02/18/15  (1.626 m)  Weight:   Wt Readings from Last 1 Encounters:  02/18/15 153 lb 11.2 oz (69.718 kg)   Wt Readings from Last 10 Encounters:  02/18/15 153 lb 11.2 oz (69.718 kg)  02/14/15 158 lb 3 oz (71.753 kg)  11/06/14 165 lb 5.5 oz (75 kg)  10/20/14 159 lb (72.122 kg)  09/30/14 163 lb (73.936 kg)    BMI:  Body mass index is 26.37 kg/(m^2).  Estimated Nutritional Needs:   Kcal:  BEE: 1080kcals, TEE: (IF 1.1-1.3)(AF 1.2) 1425-1684kcals  Protein:  77-91g protein (1.1-1.3g/kg)  Fluid:  1750-2157mL of fluid (25-70mL/kg)  EDUCATION NEEDS:   No education needs identified at this time   MODERATE Care Level  [name], RD, LDN Pager [phone] Weekend/On-Call Pager [phone]

## 2015-02-19 ENCOUNTER — Inpatient Hospital Stay: Payer: PPO

## 2015-02-19 ENCOUNTER — Inpatient Hospital Stay
Admit: 2015-02-19 | Discharge: 2015-02-19 | Disposition: A | Discharge status: Home / Self Care | Payer: PPO | Attending: Infectious Diseases | Admitting: Infectious Diseases

## 2015-02-19 LAB — CBC
HCT: 33.3 % — ABNORMAL LOW (ref 35.0–47.0)
Hemoglobin: 11.3 g/dL — ABNORMAL LOW (ref 12.0–16.0)
MCH: 31.4 pg (ref 26.0–34.0)
MCHC: 33.9 g/dL (ref 32.0–36.0)
MCV: 92.7 fL (ref 80.0–100.0)
PLATELETS: 168 10*3/uL (ref 150–440)
RBC: 3.6 MIL/uL — AB (ref 3.80–5.20)
RDW: 13.7 % (ref 11.5–14.5)
WBC: 6.6 10*3/uL (ref 3.6–11.0)

## 2015-02-19 LAB — URINE CULTURE: Culture: NO GROWTH

## 2015-02-19 LAB — BASIC METABOLIC PANEL
ANION GAP: 7 (ref 5–15)
Anion gap: 6 (ref 5–15)
BUN: 10 mg/dL (ref 6–20)
BUN: 12 mg/dL (ref 6–20)
CALCIUM: 7.8 mg/dL — AB (ref 8.9–10.3)
CHLORIDE: 96 mmol/L — AB (ref 101–111)
CO2: 25 mmol/L (ref 22–32)
CO2: 24 mmol/L (ref 22–32)
CREATININE: 0.83 mg/dL (ref 0.44–1.00)
Calcium: 7.8 mg/dL — ABNORMAL LOW (ref 8.9–10.3)
Chloride: 96 mmol/L — ABNORMAL LOW (ref 101–111)
Creatinine, Ser: 0.82 mg/dL (ref 0.44–1.00)
GFR calc non Af Amer: 58 mL/min — ABNORMAL LOW (ref >60)
GFR calc non Af Amer: 59 mL/min — ABNORMAL LOW (ref >60)
GLUCOSE: 108 mg/dL — AB (ref 65–99)
Glucose, Bld: 174 mg/dL — ABNORMAL HIGH (ref 65–99)
POTASSIUM: 3.3 mmol/L — AB (ref 3.5–5.1)
Potassium: 3.2 mmol/L — ABNORMAL LOW (ref 3.5–5.1)
SODIUM: 127 mmol/L — AB (ref 135–145)
Sodium: 127 mmol/L — ABNORMAL LOW (ref 135–145)

## 2015-02-19 LAB — MAGNESIUM: Magnesium: 2 mg/dL (ref 1.7–2.4)

## 2015-02-19 LAB — PHOSPHORUS: PHOSPHORUS: 2.7 mg/dL (ref 2.5–4.6)

## 2015-02-19 MED ORDER — POTASSIUM CHLORIDE 10 MEQ/100ML IV SOLN
10.0000 meq | INTRAVENOUS | Status: AC
Start: 2015-02-19 — End: 2015-02-19
  Administered 2015-02-19 (×4): 10 meq via INTRAVENOUS
  Filled 2015-02-19 (×5): qty 100

## 2015-02-19 NOTE — Progress Notes (Signed)
MEDICATION RELATED CONSULT NOTE - INITIAL   Pharmacy Consult for electrolyte management Indication: hypokalemia, hypomagnesemia  Allergies  Allergen Reactions  . Fosamax [Alendronate Sodium] Other (See Comments)    Reaction: Unknown    Patient Measurements: Height:  (162.6 cm) Weight: 153 lb 11.2 oz (69.718 kg) IBW/kg (Calculated) : 54.7  Vital Signs: Temp: 98.7 F (37.1 C) (02/18 0436) Temp Source: Oral (02/18 0436) BP: 170/64 mmHg (02/18 0436) Pulse Rate: 61 (02/18 0436) Intake/Output from previous day: 02/17 0701 - 02/18 0700 In: 2402 [P.O.:360; I.V.:1557; IV Piggyback:485] Out: 3700 [Urine:3600; Stool:100] Intake/Output from this shift: Total I/O In: 2042 [I.V.:1557; IV Piggyback:485] Out: 1900 [Urine:1800; Stool:100]  Labs:  Recent Labs  02/17/15 2042 02/17/15 2234 02/18/15 0440 02/19/15 0452  WBC 7.2  --  6.3 6.6  HGB 11.2*  --  10.0* 11.3*  HCT 33.3*  --  28.9* 33.3*  PLT 202  --  179 168  CREATININE 0.92  --  0.70 0.83  MG  --   --  1.3* 2.0  PHOS  --   --   --  2.7  ALBUMIN 2.5*  --   --   --   PROT 6.5  --   --   --   AST 59*  --   --   --   ALT 23  --   --   --   ALKPHOS 285*  --   --   --   BILITOT UNABLE TO REPORT DUE TO QUANTITY OF SAMPLE 0.5  --   --    Estimated Creatinine Clearance: 38.9 mL/min (by C-G formula based on Cr of 0.83).  Assessment: Pharmacy consulted to manage electrolytes in this 82 year old female.  K low this AM at 2.9 Mg low at 1.3  Goal of Therapy:  Electrolytes within normal limits  Plan:  K 3.2, Mg 2, Phos 2.7. Ordered potassium chloride 10 mEq IV Q1H x 4 doses. Will recheck potassium this afternoon and all labs tomorrow with AM labs.  Carola Frost, Pharm.D., BCPS Clinical Pharmacist 02/19/2015,6:43 AM

## 2015-02-19 NOTE — Progress Notes (Signed)
Pt was swabbed for MRSA on 02/12/2015 @ 0538. Results were Negative.

## 2015-02-19 NOTE — Progress Notes (Signed)
MEDICATION RELATED CONSULT NOTE - INITIAL   Pharmacy Consult for electrolyte management Indication: hypokalemia, hypomagnesemia  Allergies  Allergen Reactions  . Fosamax [Alendronate Sodium] Other (See Comments)    Reaction: Unknown    Patient Measurements: Height:  (162.6 cm) Weight: 153 lb 11.2 oz (69.718 kg) IBW/kg (Calculated) : 54.7  Vital Signs: Temp: 98.7 F (37.1 C) (02/18 0436) Temp Source: Oral (02/18 0436) BP: 170/64 mmHg (02/18 0436) Pulse Rate: 61 (02/18 0436) Intake/Output from previous day: 02/17 0701 - 02/18 0700 In: 2402 [P.O.:360; I.V.:1557; IV Piggyback:485] Out: 3700 [Urine:3600; Stool:100] Intake/Output from this shift: Total I/O In: 336 [I.V.:336] Out: 900 [Urine:900]  Labs:  Recent Labs  02/17/15 2042 02/17/15 2234 02/18/15 0440 02/19/15 0452  WBC 7.2  --  6.3 6.6  HGB 11.2*  --  10.0* 11.3*  HCT 33.3*  --  28.9* 33.3*  PLT 202  --  179 168  CREATININE 0.92  --  0.70 0.83  MG  --   --  1.3* 2.0  PHOS  --   --   --  2.7  ALBUMIN 2.5*  --   --   --   PROT 6.5  --   --   --   AST 59*  --   --   --   ALT 23  --   --   --   ALKPHOS 285*  --   --   --   BILITOT UNABLE TO REPORT DUE TO QUANTITY OF SAMPLE 0.5  --   --    Estimated Creatinine Clearance: 38.9 mL/min (by C-G formula based on Cr of 0.83).  Assessment: Pharmacy consulted to manage electrolytes in this 82 year old female.  K low this AM at 2.9 Mg low at 1.3  Goal of Therapy:  Electrolytes within normal limits  Plan:  K 3.2, Mg 2, Phos 2.7. Ordered potassium chloride 10 mEq IV Q1H x 4 doses. Will recheck potassium this afternoon and all labs tomorrow with AM labs.  2/18 ~ 12:00 K = 3.3. However, KCl boluses are not finished infusing.  Will not order additional supplementation at this time.  Will recheck electrolytes with am labs.  Stormy Card, RPh Clinical Pharmacist 02/19/2015,12:29 PM

## 2015-02-19 NOTE — Progress Notes (Signed)
[facility] - [place] at [facility]   PATIENT NAME: Gina [name]    MR#:  [id number]  DATE OF BIRTH:  01-04-1917  SUBJECTIVE:  CHIEF COMPLAINT:   Chief Complaint  Patient presents with  . Wound Infection   patient feels comfortable, denies any complaints or pain. Patient was seen by surgery and her care was discussed with patient's family. Patient's family chose not to proceed to surgery at this time. Patient has diarrhea and continues to to be managed with a rectal tube in place. Blood pressure is normal, elevated intermittently. Patient's sodium level remains low, as well as potassium. Magnesium level was rechecked and improved.   REVIEW OF SYSTEMS:  Demented, denied any symptoms.   DRUG ALLERGIES:   Allergies  Allergen Reactions  . Fosamax [Alendronate Sodium] Other (See Comments)    Reaction: Unknown    VITALS:  Blood pressure 137/62, pulse 67, temperature 97.7 F (36.5 C), temperature source Oral, resp. rate 14, height 5\' 4"  (1.626 m), weight 69.718 kg (153 lb 11.2 oz), SpO2 93 %.  PHYSICAL EXAMINATION:  GENERAL:  82 y.o.-year-old patient lying in the bed with no acute distress.  EYES: Pupils equal, round, reactive to light and accommodation. No scleral icterus. Extraocular muscles intact.  HEENT: Head atraumatic, normocephalic. Oropharynx and nasopharynx clear.  NECK:  Supple, no jugular venous distention. No thyroid enlargement, no tenderness.  LUNGS: Normal breath sounds bilaterally, no wheezing, rales,rhonchi or crepitation. No use of accessory muscles of respiration.  CARDIOVASCULAR: S1, S2 normal. No murmurs, rubs, or gallops.  ABDOMEN: Soft, nontender, nondistended. Bowel sounds present. No organomegaly or mass.  EXTREMITIES: No pedal edema, cyanosis, or clubbing.  Right sigh in dressing. NEUROLOGIC: Cranial nerves II through XII are intact. Muscle strength 5/5 in all extremities. Sensation intact. Gait not checked.  PSYCHIATRIC: The  patient is alert and oriented x 3.  SKIN: sacral ulcer noted, necrotic exudate noted,cutaneous fistula tract noted.   LABORATORY PANEL:   CBC  Recent Labs Lab 02/19/15 0452  WBC 6.6  HGB 11.3*  HCT 33.3*  PLT 168   ------------------------------------------------------------------------------------------------------------------  Chemistries   Recent Labs Lab 02/17/15 2042 02/17/15 2234  02/19/15 0452 02/19/15 1158  NA 124*  --   < > 127* 127*  K 3.4*  --   < > 3.2* 3.3*  CL 87*  --   < > 96* 96*  CO2 22  --   < > 25 24  GLUCOSE 118*  --   < > 108* 174*  BUN 16  --   < > 10 12  CREATININE 0.92  --   < > 0.83 0.82  CALCIUM 8.4*  --   < > 7.8* 7.8*  MG  --   --   < > 2.0  --   AST 59*  --   --   --   --   ALT 23  --   --   --   --   ALKPHOS 285*  --   --   --   --   BILITOT UNABLE TO REPORT DUE TO QUANTITY OF SAMPLE 0.5  --   --   --   < > = values in this interval not displayed. ------------------------------------------------------------------------------------------------------------------  Cardiac Enzymes No results for input(s): TROPONINI in the last 168 hours. ------------------------------------------------------------------------------------------------------------------  RADIOLOGY:  Ct Abdomen Pelvis W Contrast  02/17/2015  CLINICAL DATA:  82 year old female with sacral decubitus ulcer and surrounding cellulitis and posturing age. EXAM: CT ABDOMEN AND PELVIS  WITH CONTRAST TECHNIQUE: Multidetector CT imaging of the abdomen and pelvis was performed using the standard protocol following bolus administration of intravenous contrast. CONTRAST:  OMNIPAQUE IOHEXOL 300 MG/ML  SOLN COMPARISON:  CT dated 01/19/2006 FINDINGS: Partially visualized small bilateral pleural effusions. The visualized lung bases are otherwise clear. There is coronary vascular calcification. No intra-abdominal free air or free fluid. Cirrhosis. Cholecystectomy. There is mild biliary ductal  dilatation, likely post cholecystectomy. The pancreas, spleen, and adrenal glands appear unremarkable. Bilateral extrarenal pelvis. There is no hydronephrosis on either side. A 1.8 cm exophytic hypodense lesion arising from the lateral cortex of the left kidney is not well characterized but likely represent cysts. Ultrasound may provide better evaluation. There is no hydronephrosis on either side. 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 The visualized ureters appear unremarkable. There is diffuse thickening and trabecular appearance of the bladder wall compatible with chronic bladder outlet obstruction. There is haziness of the perivesical fat concerning for superimposed infection. Correlation with urinalysis recommended. Underlying infiltrative process is not excluded. Cystoscopy may provide better evaluation if clinically indicated. The catheter and small pockets of air noted within the bladder. Hysterectomy. Oral contrast opacifies multiple loops of small bowel and traverses into the colon without evidence of bowel obstruction. There is sigmoid diverticulosis without active inflammatory changes. There is mild diffuse thickening of the sigmoid colon. A fistulous tract noted extending from the rectum into the sacral skin surface in the midline where there is a small skin defect. This tract measures approximately 6.6 cm in length and 1.6 cm in width. Contrast noted exiting from the sacral cutaneous defect. There are stranding inflammatory changes of the right gluteal subcutaneous fat. No drainable fluid collection or abscess identified. There is aortoiliac atherosclerotic disease. There is a 2.3 cm infrarenal abdominal aortic ectasia. The abdominal aorta and IVC appear patent. No portal venous gas identified. There is no adenopathy. There is diffuse subcutaneous soft tissue stranding of the abdominal wall. There is osteopenia with degenerative changes  of the spine. Right hip orthopedic hardware. There is L2 compression fracture with approximately 50% loss of vertebral body height, age indeterminate, likely chronic. Clinical correlation is recommended. No definite acute fracture identified. IMPRESSION: Relatively large fistulous tract extending from the lower rectum/ anal region into the midline inferior sacral skin ulcer. No drainable fluid collection or abscess identified. Sigmoid diverticulosis without definite active inflammatory changes. Mild thickening of the rectosigmoid may represent mild superimposed inflammation/infection. No evidence of bowel obstruction. Diffuse thickening and trabecular appearance of the bladder wall likely related to chronic bladder outlet obstruction. There is possible superimposed cystitis. Correlation with urinalysis recommended. Cirrhosis. These results were called by telephone at the time of interpretation on 02/17/2015 at 11:34 pm to Dr. Gladstone Pih , who verbally acknowledged these results. Electronically Signed   By: Elgie Collard M.D.   On: 02/17/2015 23:35   Dg Chest Port 1 View  02/19/2015  CLINICAL DATA:  Cough EXAM: PORTABLE CHEST 1 VIEW COMPARISON:  10/20/2014 chest radiograph. FINDINGS: Slightly low lung volumes. Stable cardiomediastinal silhouette with top-normal heart size. No pneumothorax. No pleural effusion. Mild bibasilar atelectasis. No pulmonary edema. No acute consolidative airspace disease. IMPRESSION: Low lung volumes with mild bibasilar atelectasis. Otherwise no active disease in the chest. Electronically Signed   By: Delbert Phenix M.D.   On: 02/19/2015 14:49    EKG:   Orders placed or performed during the hospital encounter of 10/20/14  .  EKG 12-Lead  . EKG 12-Lead    ASSESSMENT AND PLAN:  82 year old female patient with history of diabetes mellitus,, hypertension,, osteoporosis,, dementia, sacral ulcer with necrosis was referred from nursing home for fever. Patient does a low-sodium and  currently on salt tablets at nursing home.  1. Rectocutaneous fistula. Appreciate Surgical consult, recommended diverting colostomy. However, family refused at this time. Continue patient on Flagyl orally.   2. Necrotic and infected sacral ulcer , wound cultures are pending, continue cefazolin and flagyl  per Dr. Sampson Goon. wound care.  3. Hyponatremia. Some Improvement with NS iv, f/u BMP in the morning.  4. Hypertension. Continue verapamil, improved with the pressure medication  5. Type 2 diabetes mellitus. SL.  6 MSSA bacteremia- repeat bcx revealed no growth in 2 days Continue ancef per Dr. Sampson Goon.  7 diarrhea. Stool PCR for C diff. was negative 8 Hypokalemia. Continue Iv with KCl. F/u BMP in the morning 9 Hypomagnesemia. Resolved with Iv mag, f/u level in the morning.  All the records are reviewed and case discussed with Care Management/Social Workerr. Management plans discussed with the patient's daugher and they are in agreement.  CODE STATUS: DNR  TOTAL TIME TAKING CARE OF THIS PATIENT: 45 minutes.  Discussed with patient's family, palliative care may need to be involved in decision making depending on patient's progress   Eliud Polo M.D on 02/19/2015 at 2:56 PM  Between 7am to 6pm - Pager - [phone]  After 6pm go to [email or web address] - password [email or web address] [facility]  [facility] [place] [facility]  Office  [phone]  CC: Primary care physician; No primary care provider on file.

## 2015-02-19 NOTE — Progress Notes (Signed)
Sacrum and R buttocks dsg changed today. Needs to be changed again on 02/21/2015. R hip dsg also changed, and Calcium Alginate applied. Needs to be changed again tomorrow.

## 2015-02-19 NOTE — Progress Notes (Signed)
visited the patient this morning with both daughters present I did not do a physical exam at this point. Patient is demented but awake and alert  He appears quite comfortable vital signs are reviewed  I discussed with the patient's daughters the fact that I could not get a good exam on the patient due to the amount of stool present but regardless of whether or not this is an open wound with tremendous skin breakdown or if it is truly a rectocutaneous fistula the treatment would be the same and that is for a diverting colostomy. I described the procedure for them and the options as well as the risks of a general anesthetic in an elderly demented patient. They do not wish to proceed with surgery at this time but will discuss amongst themselves and decide care area currently the patient has a rectal tube in place and her diarrhea is being managed. I will review this with them during the weekend.

## 2015-02-20 LAB — CORTISOL: CORTISOL PLASMA: 14.6 ug/dL

## 2015-02-20 LAB — BASIC METABOLIC PANEL
ANION GAP: 7 (ref 5–15)
ANION GAP: 9 (ref 5–15)
BUN: 12 mg/dL (ref 6–20)
BUN: 11 mg/dL (ref 6–20)
CALCIUM: 8.5 mg/dL — AB (ref 8.9–10.3)
CHLORIDE: 97 mmol/L — AB (ref 101–111)
CO2: 28 mmol/L (ref 22–32)
CO2: 24 mmol/L (ref 22–32)
CREATININE: 0.83 mg/dL (ref 0.44–1.00)
Calcium: 8.2 mg/dL — ABNORMAL LOW (ref 8.9–10.3)
Chloride: 95 mmol/L — ABNORMAL LOW (ref 101–111)
Creatinine, Ser: 0.83 mg/dL (ref 0.44–1.00)
GFR calc non Af Amer: 58 mL/min — ABNORMAL LOW (ref >60)
GFR calc non Af Amer: 58 mL/min — ABNORMAL LOW (ref >60)
GLUCOSE: 122 mg/dL — AB (ref 65–99)
GLUCOSE: 211 mg/dL — AB (ref 65–99)
Potassium: 3 mmol/L — ABNORMAL LOW (ref 3.5–5.1)
Potassium: 3.5 mmol/L (ref 3.5–5.1)
SODIUM: 128 mmol/L — AB (ref 135–145)
Sodium: 132 mmol/L — ABNORMAL LOW (ref 135–145)

## 2015-02-20 LAB — TSH: TSH: 5.773 u[IU]/mL — ABNORMAL HIGH (ref 0.350–4.500)

## 2015-02-20 LAB — PHOSPHORUS: PHOSPHORUS: 2.1 mg/dL — AB (ref 2.5–4.6)

## 2015-02-20 LAB — MAGNESIUM: Magnesium: 1.6 mg/dL — ABNORMAL LOW (ref 1.7–2.4)

## 2015-02-20 MED ORDER — MAGNESIUM SULFATE 4 GM/100ML IV SOLN
4.0000 g | Freq: Once | INTRAVENOUS | Status: AC
  Administered 2015-02-20: 4 g via INTRAVENOUS
  Filled 2015-02-20: qty 100

## 2015-02-20 MED ORDER — POTASSIUM CHLORIDE 10 MEQ/100ML IV SOLN
10.0000 meq | INTRAVENOUS | Status: AC
  Administered 2015-02-20 (×4): 10 meq via INTRAVENOUS
  Filled 2015-02-20 (×4): qty 100

## 2015-02-20 MED ORDER — POTASSIUM & SODIUM PHOSPHATES 280-160-250 MG PO PACK
1.0000 | PACK | Freq: Two times a day (BID) | ORAL | Status: AC
  Administered 2015-02-20 (×2): 1 via ORAL
  Filled 2015-02-20 (×2): qty 1

## 2015-02-20 MED ORDER — METOPROLOL TARTRATE 25 MG PO TABS
25.0000 mg | ORAL_TABLET | Freq: Two times a day (BID) | ORAL | Status: DC
  Administered 2015-02-20 – 2015-02-23 (×5): 25 mg via ORAL
  Filled 2015-02-20 (×6): qty 1

## 2015-02-20 NOTE — Progress Notes (Signed)
Pt was rotated Q2. Sacrum wound was clean x2 with placement of a sacrum foam placed. Pt does not complain of any pain.  Karsten Ro

## 2015-02-20 NOTE — Progress Notes (Signed)
The patient with a possible rectocutaneous fistula versus complex wound with extensive stool contamination of the area and now has a rectal tube in place which is functional.  Subjective or review of systems can be obtained from the demented patient her daughter is present  Vital signs are reviewed Physical exam of the affected areas not perform a rectal tube is in place patient is sleeping  Long discussion with the patient's daughter. I had discussed the topic yesterday of surgical intervention versus palliative care. I believe that the treatment for this at the very minimum would require a loop colostomy and a general anesthetic. The daughters have had the discussion that they do not want to put their mom through this and believe the palliative care is in order.  There are no other surgical options that would not require a general anesthetic with its associated risks in this patient therefore I will sign off.

## 2015-02-20 NOTE — Progress Notes (Signed)
MEDICATION RELATED CONSULT NOTE - INITIAL   Pharmacy Consult for electrolyte management Indication: hypokalemia, hypomagnesemia  Allergies  Allergen Reactions  . Fosamax [Alendronate Sodium] Other (See Comments)    Reaction: Unknown    Patient Measurements: Height:  (162.6 cm) Weight: 153 lb 11.2 oz (69.718 kg) IBW/kg (Calculated) : 54.7  Vital Signs: Temp: 98.7 F (37.1 C) (02/19 0542) Temp Source: Oral (02/19 0542) BP: 160/76 mmHg (02/19 0557) Pulse Rate: 62 (02/19 0542) Intake/Output from previous day: 02/18 0701 - 02/19 0700 In: 2026 [P.O.:480; I.V.:1546] Out: 3675 [Urine:3575; Stool:100] Intake/Output from this shift: Total I/O In: 818 [I.V.:818] Out: 1075 [Urine:975; Stool:100]  Labs:  Recent Labs  02/17/15 2042 02/17/15 2234 02/18/15 0440 02/19/15 0452 02/19/15 1158 02/20/15 0511  WBC 7.2  --  6.3 6.6  --   --   HGB 11.2*  --  10.0* 11.3*  --   --   HCT 33.3*  --  28.9* 33.3*  --   --   PLT 202  --  179 168  --   --   CREATININE 0.92  --  0.70 0.83 0.82 0.83  MG  --   --  1.3* 2.0  --  1.6*  PHOS  --   --   --  2.7  --  2.1*  ALBUMIN 2.5*  --   --   --   --   --   PROT 6.5  --   --   --   --   --   AST 59*  --   --   --   --   --   ALT 23  --   --   --   --   --   ALKPHOS 285*  --   --   --   --   --   BILITOT UNABLE TO REPORT DUE TO QUANTITY OF SAMPLE 0.5  --   --   --   --    Estimated Creatinine Clearance: 38.9 mL/min (by C-G formula based on Cr of 0.83).  Assessment: Pharmacy consulted to manage electrolytes in this 82 year old female.  K low this AM at 2.9 Mg low at 1.3  Goal of Therapy:  Electrolytes within normal limits  Plan:  K 3, Mg 1.6, Phos 2.1. Ordered KCl 10 mEq IV Q1H x 4 doses, magnesium sulfate 4 gm IV x 1, and phos-nak TID with meals. Will recheck potassium this afternoon and all electrolytes tomorrow morning.  Carola Frost, Pharm.D., BCPS Clinical Pharmacist 02/20/2015,6:36 AM

## 2015-02-20 NOTE — Care Management Note (Signed)
Case Management Note  Patient Details  Name: REXINE GOWENS MRN: [id number] Date of Birth: 1917-06-09  Subjective/Objective:     Consult in computer for Palliative Care. Dr Orvan Falconer, Palliative Care MD, will be at [facility] on Monday 02/21/15 seeing consults.               Action/Plan:   Expected Discharge Date:                  Expected Discharge Plan:     In-House Referral:     Discharge planning Services     Post Acute Care Choice:    Choice offered to:     DME Arranged:    DME Agency:     HH Arranged:    HH Agency:     Status of Service:     Medicare Important Message Given:    Date Medicare IM Given:    Medicare IM give by:    Date Additional Medicare IM Given:    Additional Medicare Important Message give by:     If discussed at Long Length of Stay Meetings, dates discussed:    Additional Comments:  Daphney Hopke A, RN 02/20/2015, 1:16 PM

## 2015-02-20 NOTE — Progress Notes (Signed)
[facility] - [place] at [facility]   PATIENT NAME: Gina [name]    MR#:  [id number]  DATE OF BIRTH:  1917-12-25  SUBJECTIVE:  CHIEF COMPLAINT:   Chief Complaint  Patient presents with  . Wound Infection   patient feels comfortable, denies any complaints or pain. Patient was seen by surgery and her care was discussed with patient's family. Patient's family chose not to proceed to surgery, relief care consultation is requested. Surgery signed off. Patient denies any pain or discomfort.   REVIEW OF SYSTEMS:  Demented, denied any symptoms.   DRUG ALLERGIES:   Allergies  Allergen Reactions  . Fosamax [Alendronate Sodium] Other (See Comments)    Reaction: Unknown    VITALS:  Blood pressure 163/60, pulse 73, temperature 98.7 F (37.1 C), temperature source Oral, resp. rate 16, height  (1.626 m), weight 69.718 kg (153 lb 11.2 oz), SpO2 97 %.  PHYSICAL EXAMINATION:  GENERAL:  82 y.o.-year-old patient lying in the bed with no acute distress.  EYES: Pupils equal, round, reactive to light and accommodation. No scleral icterus. Extraocular muscles intact.  HEENT: Head atraumatic, normocephalic. Oropharynx and nasopharynx clear.  NECK:  Supple, no jugular venous distention. No thyroid enlargement, no tenderness.  LUNGS: Normal breath sounds bilaterally, no wheezing, rales,rhonchi or crepitation. No use of accessory muscles of respiration.  CARDIOVASCULAR: S1, S2 normal. No murmurs, rubs, or gallops.  ABDOMEN: Soft, nontender, nondistended. Bowel sounds present. No organomegaly or mass.  EXTREMITIES: No pedal edema, cyanosis, or clubbing.  Right sigh in dressing. NEUROLOGIC: Cranial nerves II through XII are intact. Muscle strength 5/5 in all extremities. Sensation intact. Gait not checked.  PSYCHIATRIC: The patient is alert and oriented x 3.  SKIN: sacral ulcer noted, necrotic exudate noted,cutaneous fistula tract noted.   LABORATORY PANEL:    CBC  Recent Labs Lab 02/19/15 0452  WBC 6.6  HGB 11.3*  HCT 33.3*  PLT 168   ------------------------------------------------------------------------------------------------------------------  Chemistries   Recent Labs Lab 02/17/15 2042 02/17/15 2234  02/20/15 0511 02/20/15 1308  NA 124*  --   < > 132* 128*  K 3.4*  --   < > 3.0* 3.5  CL 87*  --   < > 97* 95*  CO2 22  --   < > 28 24  GLUCOSE 118*  --   < > 122* 211*  BUN 16  --   < > 12 11  CREATININE 0.92  --   < > 0.83 0.83  CALCIUM 8.4*  --   < > 8.5* 8.2*  MG  --   --   < > 1.6*  --   AST 59*  --   --   --   --   ALT 23  --   --   --   --   ALKPHOS 285*  --   --   --   --   BILITOT UNABLE TO REPORT DUE TO QUANTITY OF SAMPLE 0.5  --   --   --   < > = values in this interval not displayed. ------------------------------------------------------------------------------------------------------------------  Cardiac Enzymes No results for input(s): TROPONINI in the last 168 hours. ------------------------------------------------------------------------------------------------------------------  RADIOLOGY:  Dg Chest Port 1 View  02/19/2015  CLINICAL DATA:  Cough EXAM: PORTABLE CHEST 1 VIEW COMPARISON:  10/20/2014 chest radiograph. FINDINGS: Slightly low lung volumes. Stable cardiomediastinal silhouette with top-normal heart size. No pneumothorax. No pleural effusion. Mild bibasilar atelectasis. No pulmonary edema. No acute consolidative airspace disease. IMPRESSION: Low lung  volumes with mild bibasilar atelectasis. Otherwise no active disease in the chest. Electronically Signed   By: Delbert Phenix M.D.   On: 02/19/2015 14:49    EKG:   Orders placed or performed during the hospital encounter of 10/20/14  . EKG 12-Lead  . EKG 12-Lead    ASSESSMENT AND PLAN:  82 year old female patient with history of diabetes mellitus,, hypertension,, osteoporosis,, dementia, sacral ulcer with necrosis was referred from nursing home  for fever. Patient does a low-sodium and currently on salt tablets at nursing home.  1. Rectocutaneous fistula. Appreciate Surgical consult, recommended diverting colostomy. However, family refused. Continue patient on Flagyl orally. Palliative medicine consultation is requested  2. Necrotic and infected sacral ulcer , wound cultures are pending, continue cefazolin and flagyl  per Dr. Sampson Goon. wound care.  3. Hyponatremia. Some Improvement with NS iv, but now her sodium level is again down to 128. Discontinued IV fluids and initiate patient on fluid restrictions, although sodium level tomorrow morning  4. Hypertension. Continue verapamil, metoprolol  5. Type 2 diabetes mellitus. SL.  6 MSSA bacteremia- repeat bcx revealed no growth in 3 days Continue ancef per Dr. Sampson Goon.  7 diarrhea. Stool PCR for C diff. was negative, now patient has rectal tube, output is around 100 cc in 24 hours, may discontinue rectal tube if output remains low  8 Hypokalemia. Resolved with intravenous potassium supplementation  9 Hypomagnesemia. Resolved with Iv mag, magnesium level today in the morning was low again, supplementing intravenously again, following tomorrow morning.  All the records are reviewed and case discussed with Care Management/Social Workerr. Management plans discussed with the patient's daugher and they are in agreement.  CODE STATUS: DNR  TOTAL TIME TAKING CARE OF THIS PATIENT: 30 minutes.     Katharina Caper M.D on 02/20/2015 at 3:44 PM  Between 7am to 6pm - Pager - [phone]  After 6pm go to [email or web address] - password [email or web address] [facility]  [place] [facility]  Office  [phone]  CC: Primary care physician; No primary care provider on file.

## 2015-02-20 NOTE — Progress Notes (Signed)
Dressing changed to right hip area. Cleaned with normal saline. Wound based pink with grey/tan margins. Packed with algisite topped with pink foam dressing. Loel Ro, RN 02/20/15 1800

## 2015-02-20 NOTE — Progress Notes (Signed)
MEDICATION RELATED CONSULT NOTE - INITIAL   Pharmacy Consult for electrolyte management Indication: hypokalemia, hypomagnesemia  Allergies  Allergen Reactions  . Fosamax [Alendronate Sodium] Other (See Comments)    Reaction: Unknown    Patient Measurements: Height:  (162.6 cm) Weight: 153 lb 11.2 oz (69.718 kg) IBW/kg (Calculated) : 54.7  Vital Signs: Temp: 98.7 F (37.1 C) (02/19 0542) Temp Source: Oral (02/19 0542) BP: 163/60 mmHg (02/19 1248) Pulse Rate: 73 (02/19 1248) Intake/Output from previous day: 02/18 0701 - 02/19 0700 In: 2026 [P.O.:480; I.V.:1546] Out: 3675 [Urine:3575; Stool:100] Intake/Output from this shift: Total I/O In: 240 [P.O.:240] Out: -   Labs:  Recent Labs  02/17/15 2042 02/17/15 2234 02/18/15 0440 02/19/15 0452 02/19/15 1158 02/20/15 0511 02/20/15 1308  WBC 7.2  --  6.3 6.6  --   --   --   HGB 11.2*  --  10.0* 11.3*  --   --   --   HCT 33.3*  --  28.9* 33.3*  --   --   --   PLT 202  --  179 168  --   --   --   CREATININE 0.92  --  0.70 0.83 0.82 0.83 0.83  MG  --   --  1.3* 2.0  --  1.6*  --   PHOS  --   --   --  2.7  --  2.1*  --   ALBUMIN 2.5*  --   --   --   --   --   --   PROT 6.5  --   --   --   --   --   --   AST 59*  --   --   --   --   --   --   ALT 23  --   --   --   --   --   --   ALKPHOS 285*  --   --   --   --   --   --   BILITOT UNABLE TO REPORT DUE TO QUANTITY OF SAMPLE 0.5  --   --   --   --   --    Estimated Creatinine Clearance: 38.9 mL/min (by C-G formula based on Cr of 0.83).  Assessment: Pharmacy consulted to manage electrolytes in this 82 year old female.  K low this AM at 2.9 Mg low at 1.3  Goal of Therapy:  Electrolytes within normal limits  Plan:  K 3, Mg 1.6, Phos 2.1. Ordered KCl 10 mEq IV Q1H x 4 doses, magnesium sulfate 4 gm IV x 1, and phos-nak TID with meals. Will recheck potassium this afternoon and all electrolytes tomorrow morning.  2/19 ~ 13:00, K = 3.5.  Wll not supplement at this  time.    Stormy Card, RPh Clinical Pharmacist 02/20/2015,2:38 PM

## 2015-02-21 LAB — BASIC METABOLIC PANEL
Anion gap: 6 (ref 5–15)
BUN: 11 mg/dL (ref 6–20)
CHLORIDE: 99 mmol/L — AB (ref 101–111)
CO2: 27 mmol/L (ref 22–32)
Calcium: 8.2 mg/dL — ABNORMAL LOW (ref 8.9–10.3)
Creatinine, Ser: 0.8 mg/dL (ref 0.44–1.00)
GFR calc non Af Amer: >60 mL/min (ref >60)
Glucose, Bld: 113 mg/dL — ABNORMAL HIGH (ref 65–99)
POTASSIUM: 3.4 mmol/L — AB (ref 3.5–5.1)
SODIUM: 132 mmol/L — AB (ref 135–145)

## 2015-02-21 LAB — MRSA PCR SCREENING: MRSA BY PCR: NEGATIVE

## 2015-02-21 LAB — T4, FREE: FREE T4: 1.39 ng/dL — AB (ref 0.61–1.12)

## 2015-02-21 LAB — PHOSPHORUS: PHOSPHORUS: 2 mg/dL — AB (ref 2.5–4.6)

## 2015-02-21 LAB — MAGNESIUM: MAGNESIUM: 1.8 mg/dL (ref 1.7–2.4)

## 2015-02-21 LAB — TSH: TSH: 5.189 u[IU]/mL — AB (ref 0.350–4.500)

## 2015-02-21 MED ORDER — POTASSIUM PHOSPHATES 15 MMOLE/5ML IV SOLN
18.0000 mmol | Freq: Once | INTRAVENOUS | Status: AC
  Administered 2015-02-21: 18 mmol via INTRAVENOUS
  Filled 2015-02-21: qty 6

## 2015-02-21 MED ORDER — POTASSIUM CHLORIDE CRYS ER 20 MEQ PO TBCR
20.0000 meq | EXTENDED_RELEASE_TABLET | Freq: Once | ORAL | Status: AC
  Administered 2015-02-21: 20 meq via ORAL
  Filled 2015-02-21: qty 1

## 2015-02-21 MED ORDER — CHOLESTYRAMINE LIGHT 4 G PO PACK
4.0000 g | PACK | Freq: Two times a day (BID) | ORAL | Status: DC
  Administered 2015-02-21 – 2015-02-22 (×3): 4 g via ORAL
  Filled 2015-02-21 (×4): qty 1

## 2015-02-21 MED ORDER — PANTOPRAZOLE SODIUM 40 MG PO PACK
40.0000 mg | PACK | Freq: Every day | ORAL | Status: DC
  Administered 2015-02-22: 40 mg via ORAL
  Filled 2015-02-21 (×3): qty 20

## 2015-02-21 NOTE — Care Management (Signed)
Patient admitted from [facility].  CSW following.  Palliative consult pending.  RNCM signing off

## 2015-02-21 NOTE — Progress Notes (Signed)
MEDICATION RELATED CONSULT NOTE - INITIAL   Pharmacy Consult for electrolyte management Indication: hypokalemia, hypomagnesemia  Allergies  Allergen Reactions  . Fosamax [Alendronate Sodium] Other (See Comments)    Reaction: Unknown    Patient Measurements: Height:  (162.6 cm) Weight: 153 lb 11.2 oz (69.718 kg) IBW/kg (Calculated) : 54.7  Vital Signs: Temp: 98.6 F (37 C) (02/19 2148) Temp Source: Oral (02/19 2148) BP: 197/71 mmHg (02/19 2148) Pulse Rate: 63 (02/19 2148) Intake/Output from previous day: 02/19 0701 - 02/20 0700 In: 1000 [P.O.:480; I.V.:520] Out: 4075 [Urine:3750; Stool:325] Intake/Output from this shift: Total I/O In: 240 [P.O.:240] Out: 550 [Urine:550]  Labs:  Recent Labs  02/19/15 0452  02/20/15 0511 02/20/15 1308 02/21/15 0438  WBC 6.6  --   --   --   --   HGB 11.3*  --   --   --   --   HCT 33.3*  --   --   --   --   PLT 168  --   --   --   --   CREATININE 0.83  < > 0.83 0.83 0.80  MG 2.0  --  1.6*  --  1.8  PHOS 2.7  --  2.1*  --  2.0*  < > = values in this interval not displayed. Estimated Creatinine Clearance: 40.3 mL/min (by C-G formula based on Cr of 0.8).  Assessment: Pharmacy consulted to manage electrolytes in this 82 year old female.  K low this AM at 2.9 Mg low at 1.3  Goal of Therapy:  Electrolytes within normal limits  Plan:  K 34, Mg 1.8, Phos 2. Ordered potassium phosphate 18 mmol IV x 1 and potassium chloride 20 mEq PO x 1. Will recheck all electrolytes with AM labs.  Carola Frost, Pharm.D., BCPS Clinical Pharmacist 02/21/2015,6:09 AM

## 2015-02-21 NOTE — Consult Note (Signed)
Pharmacy Antibiotic Note  Gina [name] is a 82 y.o. female admitted on 02/17/2015 with history of MSSA bacteremia.  Pharmacy has been consulted for cefazolin dosing.  Plan: Will initiate cefazolin 2g IV q8hrs for MSSA bacteremia.  Repeat BCx negative so far. See Dr. 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  Estimated Creatinine Clearance: 40.3 mL/min (by C-G formula based on Cr of 0.8).    Allergies  Allergen Reactions  . Fosamax [Alendronate Sodium] Other (See Comments)    Reaction: Unknown    Antimicrobials this admission: Anti-infectives    Start     Dose/Rate Route Frequency Ordered Stop   02/18/15 1700  ceFAZolin (ANCEF) IVPB 2 g/50 mL premix     2 g 100 mL/hr over 30 Minutes Intravenous 3 times per day 02/18/15 1631     02/18/15 1630  metroNIDAZOLE (FLAGYL) IVPB 500 mg     500 mg 100 mL/hr over 60 Minutes Intravenous Every 8 hours 02/18/15 1617     02/18/15 1615  fluconazole (DIFLUCAN) IVPB 200 mg     200 mg 100 mL/hr over 60 Minutes Intravenous Every 24 hours 02/18/15 1606 02/20/15 1905   02/18/15 1100  vancomycin (VANCOCIN) IVPB 750 mg/150 ml premix  Status:  Discontinued     750 mg 150 mL/hr  over 60 Minutes Intravenous Every 24 hours 02/18/15 0245 02/18/15 1605   02/18/15 0400  piperacillin-tazobactam (ZOSYN) IVPB 3.375 g  Status:  Discontinued     3.375 g 12.5 mL/hr over 240 Minutes Intravenous 3 times per day 02/18/15 0245 02/18/15 1605   02/18/15 0115  cefTRIAXone (ROCEPHIN) 1 g in dextrose 5 % 50 mL IVPB  Status:  Discontinued     1 g 100 mL/hr over 30 Minutes Intravenous Every 24 hours 02/18/15 0110 02/18/15 0251   02/17/15 2045  piperacillin-tazobactam (ZOSYN) IVPB 3.375 g     3.375 g 100 mL/hr over 30 Minutes Intravenous  Once 02/17/15 2040 02/17/15 2123   02/17/15 2045  vancomycin (VANCOCIN) IVPB 1000 mg/200 mL premix     1,000 mg 200 mL/hr over 60 Minutes Intravenous  Once 02/17/15 2040 02/17/15 2156      Microbiology results: Results for orders placed or performed during the hospital encounter of 02/17/15  Blood Culture (routine x 2)     Status: None (Preliminary result)   Collection Time: 02/17/15  8:42 PM  Result Value Ref Range Status   Specimen Description BLOOD LEFT ANTECUBITAL  Final   Special Requests BOTTLES DRAWN AEROBIC AND ANAEROBIC  Final   Culture NO GROWTH 4 DAYS  Final   Report Status PENDING  Incomplete  Blood Culture (routine x 2)     Status: None (Preliminary result)   Collection Time: 02/17/15  8:45 PM  Result Value Ref Range Status   Specimen Description BLOOD LEFT HAND  Final   Special Requests BOTTLES DRAWN AEROBIC AND ANAEROBIC  Final   Culture NO GROWTH 4 DAYS  Final   Report Status PENDING  Incomplete  Urine culture     Status: None   Collection Time: 02/17/15  9:05 PM  Result Value Ref Range Status   Specimen Description URINE, RANDOM  Final   Special Requests NONE  Final   Culture NO GROWTH 2 DAYS  Final   Report Status 02/19/2015 FINAL  Final  C difficile quick scan w PCR reflex     Status: None   Collection Time: 02/18/15  3:00 PM  Result Value Ref Range Status   C Diff antigen NEGATIVE NEGATIVE Final   C Diff  toxin NEGATIVE NEGATIVE Final   C Diff interpretation Negative for C. difficile  Final  Wound culture     Status: None (Preliminary result)   Collection Time: 02/19/15 12:20 AM  Result Value Ref Range Status   Specimen Description SACRAL RIGHT SACRAL WOUND, RECTAL FISTUAL TRACT  Final   Special Requests NONE  Final   Gram Stain RARE WBC SEEN FEW GRAM POSITIVE COCCI IN PAIRS   Final   Culture HOLDING FOR POSSIBLE PATHOGEN  Final   Report Status PENDING  Incomplete    Thank you for allowing pharmacy to be a part of this patient's care.  Eveny Anastas A 02/21/2015 4:17 PM

## 2015-02-21 NOTE — Care Management Important Message (Signed)
Important Message  Patient Details  Name: Gina [name] MRN: [id number] Date of Birth: Nov 05, 1917   Medicare Important Message Given:  Yes    [name] 02/21/2015, 10:23 AM

## 2015-02-21 NOTE — Progress Notes (Addendum)
[facility] - [place] at [facility]   PATIENT NAME: Gina [name]    MR#:  [id number]  DATE OF BIRTH:  21-Dec-1917  SUBJECTIVE:  CHIEF COMPLAINT:   Chief Complaint  Patient presents with  . Wound Infection   patient feels comfortable, denies any complaints or pain. Patient was seen by surgery and her care was discussed with patient's family. Patient's family chose not to proceed to surgery, relief care consultation is requested. Surgery signed off. Patient denies any pain or discomfort.  Active Fitzgerald recommends Ancef intravenously. If patient's family decides on aggressive care, Keflex if less aggressive care. Palliative care consultation is pending.  Patient continues to have diarrheal stool, incontinent with urine REVIEW OF SYSTEMS:  Demented, denied any symptoms.   DRUG ALLERGIES:   Allergies  Allergen Reactions  . Fosamax [Alendronate Sodium] Other (See Comments)    Reaction: Unknown    VITALS:  Blood pressure 127/45, pulse 60, temperature 99.1 F (37.3 C), temperature source Oral, resp. rate 17, height  (1.626 m), weight 69.718 kg (153 lb 11.2 oz), SpO2 91 %.  PHYSICAL EXAMINATION:  GENERAL:  82 y.o.-year-old patient lying in the bed with no acute distress.  EYES: Pupils equal, round, reactive to light and accommodation. No scleral icterus. Extraocular muscles intact.  HEENT: Head atraumatic, normocephalic. Oropharynx and nasopharynx clear.  NECK:  Supple, no jugular venous distention. No thyroid enlargement, no tenderness.  LUNGS: Normal breath sounds bilaterally, no wheezing, rales,rhonchi or crepitation. No use of accessory muscles of respiration.  CARDIOVASCULAR: S1, S2 normal. No murmurs, rubs, or gallops.  ABDOMEN: Soft, nontender, nondistended. Bowel sounds present. No organomegaly or mass.  EXTREMITIES: No pedal edema, cyanosis, or clubbing.  Right sigh in dressing. NEUROLOGIC: Cranial nerves II through XII are intact. Muscle  strength 5/5 in all extremities. Sensation intact. Gait not checked.  PSYCHIATRIC: The patient is alert , disoriented SKIN: sacral ulcer is dressed, no drainage noted. Sacral area. Skin is reddened, barrier skin ointment is applied, rectal tube is in place draining liquid/watery green stool   LABORATORY PANEL:   CBC  Recent Labs Lab 02/19/15 0452  WBC 6.6  HGB 11.3*  HCT 33.3*  PLT 168   ------------------------------------------------------------------------------------------------------------------  Chemistries   Recent Labs Lab 02/17/15 2042 02/17/15 2234  02/21/15 0438  NA 124*  --   < > 132*  K 3.4*  --   < > 3.4*  CL 87*  --   < > 99*  CO2 22  --   < > 27  GLUCOSE 118*  --   < > 113*  BUN 16  --   < > 11  CREATININE 0.92  --   < > 0.80  CALCIUM 8.4*  --   < > 8.2*  MG  --   --   < > 1.8  AST 59*  --   --   --   ALT 23  --   --   --   ALKPHOS 285*  --   --   --   BILITOT UNABLE TO REPORT DUE TO QUANTITY OF SAMPLE 0.5  --   --   < > = values in this interval not displayed. ------------------------------------------------------------------------------------------------------------------  Cardiac Enzymes No results for input(s): TROPONINI in the last 168 hours. ------------------------------------------------------------------------------------------------------------------  RADIOLOGY:  No results found.  EKG:   Orders placed or performed during the hospital encounter of 10/20/14  . EKG 12-Lead  . EKG 12-Lead    ASSESSMENT AND PLAN:  82 year old  female patient with history of diabetes mellitus,, hypertension,, osteoporosis,, dementia, sacral ulcer with necrosis was referred from nursing home for fever. Patient does a low-sodium and currently on salt tablets at nursing home.  1. Rectocutaneous fistula. Appreciate Surgical consult, it was felt that patient is not a candidate for diverting colostomy. Family decided against any surgical procedure. Continue  patient on Flagyl orally. Palliative medicine consultation is requested to discuss care, treatment plan.   2. Necrotic and infected sacral ulcer , wound cultures are pending, continue cefazolin and flagyl  intravenously for now, per Dr. Fitzgerald. wound care is appreciated. Place PICC line.Sampson Goonient's family desires aggressive care for Ancef infusion, initiate patient on Keflex if less aggressive therapy is wished for by family.  3. Hyponatremia. Fluctuating sodium levels with normal saline infusion. Now IV fluids were discontinued and patient was initiated on fluid restrictions, sodium level is 132 today, follow periodically  4. Hypertension. Continue verapamil, metoprolol, relatively well controlled  5. Type 2 diabetes mellitus. SL.  6 MSSA bacteremia- repeat bcx revealed no growth in 3 days Continue ancef per Dr. Sampson Goon. Place PICC line if patient's family desires aggressive care for Ancef infusion, initiate patient on Keflex if less aggressive therapy is wished for by family.  7 diarrhea. Stool PCR for C diff. was negative, now patient has rectal tube, output is around 875 cc in 24 hours, initiate cholestyramine, may discontinue rectal tube if output is low  8 Hypokalemia. Continue oral potassium supplementation as needed  9 Hypomagnesemia. Supplemented intravenously and orally, following level intermittently  10. Sick euthyroid, no intervention All the records are reviewed and case discussed with Care Management/Social Workerr. Management plans discussed with the patient's daugher and they are in agreement.  CODE STATUS: DNR  TOTAL TIME TAKING CARE OF THIS PATIENT: 45 minutes.   About 10 to 15 minutes spent on discussion with patient's family, all questions were answered  Cederick Broadnax M.D on 02/21/2015 at 3:27 PM  Between 7am to 6pm - Pager - [phone]  After 6pm go to [email or web address] - password [email or web address] [facility]  [facility]  Office   [phone]  CC: Primary care physician; No primary care provider on file.

## 2015-02-21 NOTE — Progress Notes (Signed)
[facility] CLINIC INFECTIOUS DISEASE PROGRESS NOTE Date of Admission:  02/17/2015     ID: Gina [name] is a 82 y.o. female with MSSA bacteremia from R thigh abscess,, likely rectocutaneousActive Problems:   Hyponatremia   Wound infection (HCC)   Colo-enteric fistula   Subjective: No fevers, fu bcx negative. Today complains of R hip pain   ROS  Eleven systems are reviewed and negative except per hpi  Medications:  Antibiotics Given (last 72 hours)    Date/Time Action Medication Dose Rate   02/18/15 1901 Given   metroNIDAZOLE (FLAGYL) IVPB 500 mg 500 mg 100 mL/hr   02/18/15 2243 Given   ceFAZolin (ANCEF) IVPB 2 g/50 mL premix 2 g 100 mL/hr   02/19/15 0036 Given   metroNIDAZOLE (FLAGYL) IVPB 500 mg 500 mg 100 mL/hr   02/19/15 0534 Given   ceFAZolin (ANCEF) IVPB 2 g/50 mL premix 2 g 100 mL/hr   02/19/15 3016 Given   metroNIDAZOLE (FLAGYL) IVPB 500 mg 500 mg 100 mL/hr   02/19/15 1429 Given   ceFAZolin (ANCEF) IVPB 2 g/50 mL premix 2 g 100 mL/hr   02/19/15 1638 Given   metroNIDAZOLE (FLAGYL) IVPB 500 mg 500 mg 100 mL/hr   02/19/15 2133 Given   ceFAZolin (ANCEF) IVPB 2 g/50 mL premix 2 g 100 mL/hr   02/19/15 2355 Given   metroNIDAZOLE (FLAGYL) IVPB 500 mg 500 mg 100 mL/hr   02/20/15 0500 Given   ceFAZolin (ANCEF) IVPB 2 g/50 mL premix 2 g 100 mL/hr   02/20/15 0847 Given   metroNIDAZOLE (FLAGYL) IVPB 500 mg 500 mg 100 mL/hr   02/20/15 1431 Given   ceFAZolin (ANCEF) IVPB 2 g/50 mL premix 2 g 100 mL/hr   02/20/15 1614 Given   metroNIDAZOLE (FLAGYL) IVPB 500 mg 500 mg 100 mL/hr   02/20/15 2152 Given   ceFAZolin (ANCEF) IVPB 2 g/50 mL premix 2 g 100 mL/hr   02/20/15 2337 Given   metroNIDAZOLE (FLAGYL) IVPB 500 mg 500 mg 100 mL/hr   02/21/15 0109 Given   ceFAZolin (ANCEF) IVPB 2 g/50 mL premix 2 g 100 mL/hr   02/21/15 1046 Given   metroNIDAZOLE (FLAGYL) IVPB 500 mg 500 mg 100 mL/hr     . ALPRAZolam  0.5 mg Oral QHS  . atorvastatin  10 mg Oral q1800  . calcium-vitamin D   1 tablet Oral BID  .  ceFAZolin (ANCEF) IV  2 g Intravenous 3 times per day  . clotrimazole   Topical BID  . enoxaparin (LOVENOX) injection  40 mg Subcutaneous QHS  . feeding supplement (ENSURE ENLIVE)  237 mL Oral TID WC  . ferrous sulfate  325 mg Oral BID WC  . levothyroxine  137 mcg Oral QAC breakfast  . metFORMIN  500 mg Oral BID WC  . metoprolol tartrate  25 mg Oral BID  . metronidazole  500 mg Intravenous Q8H  . [START ON 02/22/2015] pantoprazole sodium  40 mg Oral Daily  . sodium chloride  1 g Oral BID WC  . traZODone  50 mg Oral QHS  . verapamil  240 mg Oral Daily  . vitamin C  500 mg Oral Daily  . Vitamin D (Ergocalciferol)  50,000 Units Oral Once per day on Sat    Objective: Vital signs in last 24 hours: Temp:  [98.3 F (36.8 C)-99.1 F (37.3 C)] 99.1 F (37.3 C) (02/20 1351) Pulse Rate:  [60-87] 60 (02/20 1351) Resp:  [16-20] 17 (02/20 1351) BP: (127-197)/(45-72) 127/45 mmHg (02/20 1351) SpO2:  [  91 %-97 %] 91 % (02/20 1351) Constitutional: Frail, lying in bed HENT: Noank/AT, PERRLA, no scleral icterus Mouth/Throat: Oropharynx is clear and dry.  Cardiovascular: Normal rate, regular rhythm and normal heart sounds.  Pulmonary/Chest: bil rhonchi Neck = supple, no nuchal rigidity Abdominal: Soft. Bowel sounds are normal. exhibits no distension. There is no tenderness.  Lymphadenopathy: no cervical adenopathy. No axillary adenopathy Neurological:pleasantly demented Skin: R thigh wound nice and clean - no drainage. R buttock with a new 2 cm ulcer with dark eschar - able ot express thick pus, has diffuse fungal rash there too  Lab Results  Recent Labs  02/19/15 0452  02/20/15 1308 02/21/15 0438  WBC 6.6  --   --   --   HGB 11.3*  --   --   --   HCT 33.3*  --   --   --   NA 127*  < > 128* 132*  K 3.2*  < > 3.5 3.4*  CL 96*  < > 95* 99*  CO2 25  < > 24 27  BUN 10  < > 11 11  CREATININE 0.83  < > 0.83 0.80  < > = values in this interval not  displayed.  Microbiology: Results for orders placed or performed during the hospital encounter of 02/17/15  Blood Culture (routine x 2)     Status: None (Preliminary result)   Collection Time: 02/17/15  8:42 PM  Result Value Ref Range Status   Specimen Description BLOOD LEFT ANTECUBITAL  Final   Special Requests BOTTLES DRAWN AEROBIC AND ANAEROBIC  Final   Culture NO GROWTH 4 DAYS  Final   Report Status PENDING  Incomplete  Blood Culture (routine x 2)     Status: None (Preliminary result)   Collection Time: 02/17/15  8:45 PM  Result Value Ref Range Status   Specimen Description BLOOD LEFT HAND  Final   Special Requests BOTTLES DRAWN AEROBIC AND ANAEROBIC  Final   Culture NO GROWTH 4 DAYS  Final   Report Status PENDING  Incomplete  Urine culture     Status: None   Collection Time: 02/17/15  9:05 PM  Result Value Ref Range Status   Specimen Description URINE, RANDOM  Final   Special Requests NONE  Final   Culture NO GROWTH 2 DAYS  Final   Report Status 02/19/2015 FINAL  Final  C difficile quick scan w PCR reflex     Status: None   Collection Time: 02/18/15  3:00 PM  Result Value Ref Range Status   C Diff antigen NEGATIVE NEGATIVE Final   C Diff toxin NEGATIVE NEGATIVE Final   C Diff interpretation Negative for C. difficile  Final  Wound culture     Status: None (Preliminary result)   Collection Time: 02/19/15 12:20 AM  Result Value Ref Range Status   Specimen Description SACRAL RIGHT SACRAL WOUND, RECTAL FISTUAL TRACT  Final   Special Requests NONE  Final   Gram Stain RARE WBC SEEN FEW GRAM POSITIVE COCCI IN PAIRS   Final   Culture HOLDING FOR POSSIBLE PATHOGEN  Final   Report Status PENDING  Incomplete     Studies/Results: Ct Abdomen Pelvis W Contrast  02/17/2015  CLINICAL DATA:  82 year old female with sacral decubitus ulcer and surrounding cellulitis and posturing age. EXAM: CT ABDOMEN AND PELVIS WITH CONTRAST TECHNIQUE: Multidetector CT imaging of the abdomen  and pelvis was performed using the standard protocol following bolus administration of intravenous contrast. CONTRAST:  OMNIPAQUE  IOHEXOL 300 MG/ML  SOLN COMPARISON:  CT dated 01/19/2006 FINDINGS: Partially visualized small bilateral pleural effusions. The visualized lung bases are otherwise clear. There is coronary vascular calcification. No intra-abdominal free air or free fluid. Cirrhosis. Cholecystectomy. There is mild biliary ductal dilatation, likely post cholecystectomy. The pancreas, spleen, and adrenal glands appear unremarkable. Bilateral extrarenal pelvis. There is no hydronephrosis on either side. A 1.8 cm exophytic hypodense lesion arising from the lateral cortex of the left kidney is not well characterized but likely represent cysts. Ultrasound may provide better evaluation. There is no hydronephrosis on either side. 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 The visualized ureters appear unremarkable. There is diffuse thickening and trabecular appearance of the bladder wall compatible with chronic bladder outlet obstruction. There is haziness of the perivesical fat concerning for superimposed infection. Correlation with urinalysis recommended. Underlying infiltrative process is not excluded. Cystoscopy may provide better evaluation if clinically indicated. The catheter and small pockets of air noted within the bladder. Hysterectomy. Oral contrast opacifies multiple loops of small bowel and traverses into the colon without evidence of bowel obstruction. There is sigmoid diverticulosis without active inflammatory changes. There is mild diffuse thickening of the sigmoid colon. A fistulous tract noted extending from the rectum into the sacral skin surface in the midline where there is a small skin defect. This tract measures approximately 6.6 cm in length and 1.6 cm in width. Contrast noted exiting from the sacral cutaneous defect. There  are stranding inflammatory changes of the right gluteal subcutaneous fat. No drainable fluid collection or abscess identified. There is aortoiliac atherosclerotic disease. There is a 2.3 cm infrarenal abdominal aortic ectasia. The abdominal aorta and IVC appear patent. No portal venous gas identified. There is no adenopathy. There is diffuse subcutaneous soft tissue stranding of the abdominal wall. There is osteopenia with degenerative changes of the spine. Right hip orthopedic hardware. There is L2 compression fracture with approximately 50% loss of vertebral body height, age indeterminate, likely chronic. Clinical correlation is recommended. No definite acute fracture identified. IMPRESSION: Relatively large fistulous tract extending from the lower rectum/ anal region into the midline inferior sacral skin ulcer. No drainable fluid collection or abscess identified. Sigmoid diverticulosis without definite active inflammatory changes. Mild thickening of the rectosigmoid may represent mild superimposed inflammation/infection. No evidence of bowel obstruction. Diffuse thickening and trabecular appearance of the bladder wall likely related to chronic bladder outlet obstruction. There is possible superimposed cystitis. Correlation with urinalysis recommended. Cirrhosis. These results were called by telephone at the time of interpretation on 02/17/2015 at 11:34 pm to Dr. Gladstone Pih , who verbally acknowledged these results. Electronically Signed   By: Elgie Collard M.D.   On: 02/17/2015 23:35   Dg Chest Port 1 View  02/19/2015  CLINICAL DATA:  Cough EXAM: PORTABLE CHEST 1 VIEW COMPARISON:  10/20/2014 chest radiograph. FINDINGS: Slightly low lung volumes. Stable cardiomediastinal silhouette with top-normal heart size. No pneumothorax. No pleural effusion. Mild bibasilar atelectasis. No pulmonary edema. No acute consolidative airspace disease. IMPRESSION: Low lung volumes with mild bibasilar atelectasis. Otherwise  no active disease in the chest. Electronically Signed   By: Delbert Phenix M.D.   On: 02/19/2015 14:49   Dg Femur, Min 2 Views Right  02/12/2015  CLINICAL DATA:  Acute onset of right thigh wound, thought to reflect a spider bite. Initial encounter. EXAM: RIGHT FEMUR 2 VIEWS COMPARISON:  Right hip radiographs performed 11/06/2014 FINDINGS: There is lucency  about the proximal aspect of the patient's right femoral stem, measuring up to 4 mm, concerning for mild loosening. The dynamic hip screw appears to have migrated centrally since the prior study, extending through the right femoral head and impacting on the acetabulum. This likely causes markedly limited range of motion. The previously noted small fracture line through the residual greater femoral trochanter demonstrates mild interval healing. Mild degenerative change is noted at the right knee, with medial chronic narrowing and marginal osteophyte formation. The known soft tissue wound is not well characterized on radiograph. No radiopaque foreign bodies are seen. IMPRESSION: 1. Known soft tissue wound is not well characterized on radiograph. No radiopaque foreign bodies seen. 2. Dynamic hip screw appears to have migrated centrally since the prior study, now extending through the right femoral head and impacting on the acetabulum. This likely causes markedly limited range of motion. 3. Lucency about the proximal aspect of the right femoral stem, measuring up to 4 mm, concerning for mild loosening. This appears to be new from the prior study. These results were called by telephone at the time of interpretation on 02/12/2015 at 12:50 am to Dr. Chiquita Loth, who verbally acknowledged these results. Electronically Signed   By: Roanna Raider M.D.   On: 02/12/2015 00:53   - Normal study. No cardiac source of emboli was indentified. normal overall left ventricular function Normal wall motion Ejection fraction of greater than 65% Mild thickening of aortic and  mitral valve. Assessment/Plan: ANGELEAH LABRAKE is a 82 y.o. female with R thigh abscess s/p I and D with bcx wound cx MSSA also now with R gluteal/sacral decub which seems to communicate with the colon via a rectocutaneous fistula.   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 Surgery does not feel she is a candidate for colostomy. Can treat with IV abx but will not cure the fistula tract.  Would consult palliative care.  Thank you very much for the consult. Will follow with you.  Takyia Sindt   02/21/2015, 2:50 PM

## 2015-02-22 LAB — BASIC METABOLIC PANEL
Anion gap: 6 (ref 5–15)
BUN: 12 mg/dL (ref 6–20)
CO2: 27 mmol/L (ref 22–32)
CREATININE: 0.97 mg/dL (ref 0.44–1.00)
Calcium: 8.5 mg/dL — ABNORMAL LOW (ref 8.9–10.3)
Chloride: 99 mmol/L — ABNORMAL LOW (ref 101–111)
GFR calc Af Amer: 56 mL/min — ABNORMAL LOW (ref >60)
GFR, EST NON AFRICAN AMERICAN: 48 mL/min — AB (ref >60)
Glucose, Bld: 158 mg/dL — ABNORMAL HIGH (ref 65–99)
POTASSIUM: 3.1 mmol/L — AB (ref 3.5–5.1)
SODIUM: 132 mmol/L — AB (ref 135–145)

## 2015-02-22 LAB — CULTURE, BLOOD (ROUTINE X 2)
Culture: NO GROWTH
Culture: NO GROWTH

## 2015-02-22 LAB — PHOSPHORUS: PHOSPHORUS: 2.5 mg/dL (ref 2.5–4.6)

## 2015-02-22 LAB — MAGNESIUM: MAGNESIUM: 1.6 mg/dL — AB (ref 1.7–2.4)

## 2015-02-22 MED ORDER — RISAQUAD PO CAPS
1.0000 | ORAL_CAPSULE | Freq: Every day | ORAL | Status: DC
  Administered 2015-02-22 – 2015-02-23 (×2): 1 via ORAL
  Filled 2015-02-22 (×2): qty 1

## 2015-02-22 MED ORDER — HYDRALAZINE HCL 25 MG PO TABS
25.0000 mg | ORAL_TABLET | Freq: Three times a day (TID) | ORAL | Status: DC
  Administered 2015-02-23: 25 mg via ORAL
  Filled 2015-02-22: qty 1

## 2015-02-22 MED ORDER — HYDRALAZINE HCL 20 MG/ML IJ SOLN
10.0000 mg | INTRAMUSCULAR | Status: DC | PRN
  Administered 2015-02-22 – 2015-02-23 (×2): 10 mg via INTRAVENOUS
  Filled 2015-02-22 (×2): qty 1

## 2015-02-22 MED ORDER — POTASSIUM CHLORIDE 20 MEQ PO PACK
40.0000 meq | PACK | Freq: Two times a day (BID) | ORAL | Status: AC
  Administered 2015-02-22 (×2): 40 meq via ORAL
  Filled 2015-02-22 (×3): qty 2

## 2015-02-22 MED ORDER — LABETALOL HCL 5 MG/ML IV SOLN
10.0000 mg | Freq: Once | INTRAVENOUS | Status: AC
  Administered 2015-02-22: 10 mg via INTRAVENOUS
  Filled 2015-02-22: qty 4

## 2015-02-22 MED ORDER — CHOLESTYRAMINE LIGHT 4 G PO PACK
4.0000 g | PACK | Freq: Three times a day (TID) | ORAL | Status: DC
  Administered 2015-02-22 – 2015-02-23 (×3): 4 g via ORAL
  Filled 2015-02-22 (×5): qty 1

## 2015-02-22 MED ORDER — ISOSORBIDE MONONITRATE ER 30 MG PO TB24: 30.0000 mg | ORAL_TABLET | Freq: Every day | ORAL | Status: DC

## 2015-02-22 MED ORDER — MAGNESIUM SULFATE 4 GM/100ML IV SOLN
4.0000 g | Freq: Once | INTRAVENOUS | Status: AC
  Administered 2015-02-22: 4 g via INTRAVENOUS
  Filled 2015-02-22: qty 100

## 2015-02-22 MED ORDER — LABETALOL HCL 200 MG PO TABS
100.0000 mg | ORAL_TABLET | Freq: Two times a day (BID) | ORAL | Status: DC
  Administered 2015-02-22: 100 mg via ORAL
  Filled 2015-02-22: qty 1

## 2015-02-22 NOTE — Progress Notes (Signed)
Pharmacy and MD notified regarding new order for isosorbide. Med cannot be crushed and pt unable to swallow pill whole. Dr. Winona Legato okay to discontinue med and monitor pt.

## 2015-02-22 NOTE — Progress Notes (Signed)
MEDICATION RELATED CONSULT NOTE - follow up  Pharmacy Consult for electrolyte management Indication: hypokalemia, hypomagnesemia  Allergies  Allergen Reactions  . Fosamax [Alendronate Sodium] Other (See Comments)    Reaction: Unknown    Patient Measurements: Height:  (162.6 cm) Weight: 153 lb 11.2 oz (69.718 kg) IBW/kg (Calculated) : 54.7  Vital Signs: Temp: 98.6 F (37 C) (02/21 0659) Temp Source: Oral (02/21 0659) BP: 154/50 mmHg (02/21 0925) Pulse Rate: 72 (02/21 0659) Intake/Output from previous day: 02/20 0701 - 02/21 0700 In: 960 [P.O.:960] Out: 3300 [Urine:3300] Intake/Output from this shift:    Labs:  Recent Labs  02/20/15 0511 02/20/15 1308 02/21/15 0438 02/22/15 0529  CREATININE 0.83 0.83 0.80 0.97  MG 1.6*  --  1.8 1.6*  PHOS 2.1*  --  2.0* 2.5   Estimated Creatinine Clearance: 33.2 mL/min (by C-G formula based on Cr of 0.97).  Assessment: Pharmacy consulted to manage electrolytes in this 81 year old female.  K low this AM at 2.9 Mg low at 1.3  Goal of Therapy:  Electrolytes within normal limits  Plan:  K 34, Mg 1.8, Phos 2. Ordered potassium phosphate 18 mmol IV x 1 and potassium chloride 20 mEq PO x 1. Will recheck all electrolytes with AM labs.  2/21: K 3.1  Mag 1.6  Phos 2.5.  Magnesium sulfate 4 gram IV x 1 and KCL po x 2 per MD. F/U labs in am.   Aaryn Sermon A, Pharm.D., BCPS Clinical Pharmacist 02/22/2015,9:37 AM

## 2015-02-22 NOTE — Consult Note (Signed)
Pharmacy Antibiotic Note  Gina [name] is a 82 y.o. female admitted on 02/17/2015 with  MSSA bacteremia. Pharmacy has been consulted for cefazolin dosing. MSSA bacteremia from R thigh abscess, R gluteal/sacral decub which seems to communicate with the colon via a rectocutaneous fistula.   Plan: Will initiate cefazolin 2g IV q8hrs for MSSA bacteremia.  Repeat BCx negative so far. See Dr. 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 Patient is borderline for renal adjustment. Will continue current dose for now, f/u renal fxn.   Height:  (162.6 cm) Weight: 153 lb 11.2 oz (69.718 kg) IBW/kg (Calculated) : 54.7  Temp (24hrs), Avg:99 F (37.2 C), Min:98.6 F (37 C), Max:99.2 F (37.3 C)   Recent Labs Lab 02/17/15 2040  02/17/15 2042 02/18/15 0440 02/19/15 0452 02/19/15 1158 02/20/15 0511 02/20/15 1308 02/21/15 0438 02/22/15 0529  WBC  --   --  7.2 6.3 6.6  --   --   --   --   --   CREATININE  --   < > 0.92 0.70 0.83 0.82 0.83 0.83 0.80 0.97  LATICACIDVEN 1.3  --   --   --   --   --   --   --   --   --   < > = values in this interval not displayed.  Estimated Creatinine Clearance: 33.2 mL/min (by C-G formula based on Cr of 0.97).    Allergies  Allergen Reactions  . Fosamax [Alendronate Sodium] Other (See Comments)    Reaction: Unknown    Antimicrobials this admission: Anti-infectives    Start     Dose/Rate Route Frequency Ordered Stop   02/18/15 1700  ceFAZolin (ANCEF) IVPB 2 g/50 mL premix     2 g 100 mL/hr over 30 Minutes Intravenous 3 times per day 02/18/15 1631     02/18/15 1630  metroNIDAZOLE (FLAGYL) IVPB 500 mg     500 mg 100 mL/hr over 60 Minutes Intravenous  Every 8 hours 02/18/15 1617     02/18/15 1615  fluconazole (DIFLUCAN) IVPB 200 mg     200 mg 100 mL/hr over 60 Minutes Intravenous Every 24 hours 02/18/15 1606 02/20/15 1905   02/18/15 1100  vancomycin (VANCOCIN) IVPB 750 mg/150 ml premix  Status:  Discontinued     750 mg 150 mL/hr over 60 Minutes Intravenous Every 24 hours 02/18/15 0245 02/18/15 1605   02/18/15 0400  piperacillin-tazobactam (ZOSYN) IVPB 3.375 g  Status:  Discontinued     3.375 g 12.5 mL/hr over 240 Minutes Intravenous 3 times per day 02/18/15 0245 02/18/15 1605   02/18/15 0115  cefTRIAXone (ROCEPHIN) 1 g in dextrose 5 % 50 mL IVPB  Status:  Discontinued     1 g 100 mL/hr over 30 Minutes Intravenous Every 24 hours 02/18/15 0110 02/18/15 0251   02/17/15 2045  piperacillin-tazobactam (ZOSYN) IVPB 3.375 g     3.375 g 100 mL/hr over 30 Minutes Intravenous  Once 02/17/15 2040 02/17/15 2123   02/17/15 2045  vancomycin (VANCOCIN) IVPB 1000 mg/200 mL premix     1,000 mg 200 mL/hr over 60 Minutes Intravenous  Once 02/17/15 2040 02/17/15 2156  Microbiology results: Results for orders placed or performed during the hospital encounter of 02/17/15  Blood Culture (routine x 2)     Status: None   Collection Time: 02/17/15  8:42 PM  Result Value Ref Range Status   Specimen Description BLOOD LEFT ANTECUBITAL  Final   Special Requests BOTTLES DRAWN AEROBIC AND ANAEROBIC  Final   Culture NO GROWTH 5 DAYS  Final   Report Status 02/22/2015 FINAL  Final  Blood Culture (routine x 2)     Status: None   Collection Time: 02/17/15  8:45 PM  Result Value Ref Range Status   Specimen Description BLOOD LEFT HAND  Final   Special Requests BOTTLES DRAWN AEROBIC AND ANAEROBIC  Final   Culture NO GROWTH 5 DAYS  Final   Report Status 02/22/2015 FINAL  Final  Urine culture     Status: None   Collection Time: 02/17/15  9:05 PM  Result Value Ref Range Status   Specimen Description URINE, RANDOM  Final   Special Requests NONE  Final    Culture NO GROWTH 2 DAYS  Final   Report Status 02/19/2015 FINAL  Final  C difficile quick scan w PCR reflex     Status: None   Collection Time: 02/18/15  3:00 PM  Result Value Ref Range Status   C Diff antigen NEGATIVE NEGATIVE Final   C Diff toxin NEGATIVE NEGATIVE Final   C Diff interpretation Negative for C. difficile  Final  Wound culture     Status: None (Preliminary result)   Collection Time: 02/19/15 12:20 AM  Result Value Ref Range Status   Specimen Description SACRAL RIGHT SACRAL WOUND, RECTAL FISTUAL TRACT  Final   Special Requests NONE  Final   Gram Stain RARE WBC SEEN FEW GRAM POSITIVE COCCI IN PAIRS   Final   Culture   Final    MODERATE GROWTH STAPHYLOCOCCUS AUREUS LIGHT GROWTH YEAST IDENTIFICATION TO FOLLOW    Report Status PENDING  Incomplete   Organism ID, Bacteria STAPHYLOCOCCUS AUREUS  Final      Susceptibility   Staphylococcus aureus - MIC*    CIPROFLOXACIN >=8 RESISTANT Resistant     ERYTHROMYCIN >=8 RESISTANT Resistant     GENTAMICIN <=0.5 SENSITIVE Sensitive     OXACILLIN 0.5 SENSITIVE Sensitive     TETRACYCLINE <=1 SENSITIVE Sensitive     VANCOMYCIN 1 SENSITIVE Sensitive     TRIMETH/SULFA <=10 SENSITIVE Sensitive     CLINDAMYCIN <=0.25 SENSITIVE Sensitive     RIFAMPIN <=0.5 SENSITIVE Sensitive     Inducible Clindamycin NEGATIVE Sensitive     * MODERATE GROWTH STAPHYLOCOCCUS AUREUS  MRSA PCR Screening     Status: None   Collection Time: 02/21/15  7:05 PM  Result Value Ref Range Status   MRSA by PCR NEGATIVE NEGATIVE Final    Comment:        The GeneXpert MRSA Assay (FDA approved for NASAL specimens only), is one component of a comprehensive MRSA colonization surveillance program. It is not intended to diagnose MRSA infection nor to guide or monitor treatment for MRSA infections.     Thank you for allowing pharmacy to be a part of this patient's care.  Lashone Stauber A 02/22/2015 10:11 AM

## 2015-02-22 NOTE — Progress Notes (Signed)
[facility] [place] [facility]   PATIENT NAME: Gina [name]    MR#:  [id number]  DATE OF BIRTH:  [birth year]  SUBJECTIVE:  CHIEF COMPLAINT:   Chief Complaint  Patient presents with  . Wound Infection   patient feels comfortable, denies any complaints or pain. Patient was seen by surgery and her care was discussed with patient's family. Patient's family chose not to proceed to surgery, relief care consultation is requested. Surgery signed off. Patient denies any pain or discomfort.  Patient was initiated on cholestyramine for diarrheal stool and her stool consistency is a little thicker today, however, patient still requires rectal tube. She denies any significant abdominal pain or discomfort. I called patient's family in regards to antibiotic therapy, although was not able to discuss with any family members, I asked them to call hospital. However, we did not receive any call until I left the hospital. Wound cultures are growing MSSA, acidophilus was initiated Patient is bradycardic with labetalol, verapamil, however, patient's blood pressure has improved REVIEW OF SYSTEMS:  Demented, denied any symptoms.   DRUG ALLERGIES:   Allergies  Allergen Reactions  . Fosamax [Alendronate Sodium] Other (See Comments)    Reaction: Unknown    VITALS:  Blood pressure 154/50, pulse 72, temperature 98.6 F (37 C), temperature source Oral, resp. rate 18, height  (1.626 m), weight 69.718 kg (153 lb 11.2 oz), SpO2 93 %.  PHYSICAL EXAMINATION:  GENERAL:  82 y.o.-year-old patient lying in the bed with no acute distress.  EYES: Pupils equal, round, reactive to light and accommodation. No scleral icterus. Extraocular muscles intact.  HEENT: Head atraumatic, normocephalic. Oropharynx and nasopharynx clear.  NECK:  Supple, no jugular venous distention. No thyroid enlargement, no tenderness.  LUNGS: Normal breath sounds bilaterally, no wheezing, rales,rhonchi or  crepitation. No use of accessory muscles of respiration.  CARDIOVASCULAR: S1, S2 normal. No murmurs, rubs, or gallops.  ABDOMEN: Soft, nontender, nondistended. Bowel sounds present. No organomegaly or mass.  EXTREMITIES: No pedal edema, cyanosis, or clubbing.  Right sigh in dressing. NEUROLOGIC: Cranial nerves II through XII are intact. Muscle strength 5/5 in all extremities. Sensation intact. Gait not checked.  PSYCHIATRIC: The patient is alert , disoriented SKIN: sacral ulcer is dressed, no drainage noted. Sacral area. Skin is reddened, barrier skin ointment is applied, rectal tube is in place draining liquid/watery green stool   LABORATORY PANEL:   CBC  Recent Labs Lab 02/19/15 0452  WBC 6.6  HGB 11.3*  HCT 33.3*  PLT 168   ------------------------------------------------------------------------------------------------------------------  Chemistries   Recent Labs Lab 02/17/15 2042 02/17/15 2234  02/22/15 0529  NA 124*  --   < > 132*  K 3.4*  --   < > 3.1*  CL 87*  --   < > 99*  CO2 22  --   < > 27  GLUCOSE 118*  --   < > 158*  BUN 16  --   < > 12  CREATININE 0.92  --   < > 0.97  CALCIUM 8.4*  --   < > 8.5*  MG  --   --   < > 1.6*  AST 59*  --   --   --   ALT 23  --   --   --   ALKPHOS 285*  --   --   --   BILITOT UNABLE TO REPORT DUE TO QUANTITY OF SAMPLE 0.5  --   --   < > = values  in this interval not displayed. ------------------------------------------------------------------------------------------------------------------  Cardiac Enzymes No results for input(s): TROPONINI in the last 168 hours. ------------------------------------------------------------------------------------------------------------------  RADIOLOGY:  No results found.  EKG:   Orders placed or performed during the hospital encounter of 10/20/14  . EKG 12-Lead  . EKG 12-Lead    ASSESSMENT AND PLAN:  82 year old female patient with history of diabetes mellitus,, hypertension,,  osteoporosis,, dementia, sacral ulcer with necrosis was referred from nursing home for fever. Patient does a low-sodium and currently on salt tablets at nursing home.  1. Rectocutaneous fistula into sacral wound, growing MSSA, continue patient on Flagyl, initiate lactobacillus. Appreciate Surgical consult, it was felt that patient is not a candidate for diverting colostomy. Family decided against any surgical procedure. Continue patient on Flagyl orally. I called patient's family to discuss antibiotic therapy as outpatient, however, did not receive any call from the family at this time. We'll attempt to to discuss tomorrow. Message was left to patient's daughter  2. Necrotic and infected sacral ulcer , wound cultures MSSA, continue cefazolin and flagyl  intravenously for now, per Dr. Sampson Goon. wound care is appreciated. Place PICC line, if family is agreeable, initiate patient on Keflex if less aggressive therapy is wished for by family. Again, left message with patient's daughter, however, not received a call by  6:30 PM   3. Hyponatremia. Fluctuating sodium levels with normal saline infusion. Now IV fluids were discontinued and patient was initiated on fluid restrictions, sodium level remains at 132 today, follow periodically  4. Hypertension. Continue verapamil, metoprolol,, hydralazine, patient became bradycardic with labetalol addition, now discontinued   5. Type 2 diabetes mellitus. SL.  6 MSSA bacteremia- repeat bcx revealed no growth in 3 days Continue ancef per Dr. Sampson Goon. Place PICC line if patient's family desires aggressive care for Ancef infusion, initiate patient on Keflex if less aggressive therapy is wished for by family.  7 diarrhea. Stool PCR for C diff. was negative, now patient has rectal tube, output is significant, advance  cholestyramine to 3 times daily, may discontinue rectal tube if output is low  8 Hypokalemia. Continue oral potassium supplementation, follow  potassium in the morning  9 Hypomagnesemia. Supplement intravenously   10. Sick euthyroid, no intervention  11. Dysphagia. Continue dysphagia diet   All the records are reviewed and case discussed with Care Management/Social Workerr. Management plans discussed with the patient's daugher and they are in agreement.  CODE STATUS: DNR  TOTAL TIME TAKING CARE OF THIS PATIENT: 45 minutes.  Attempted to reach family, discussed with care management and nursing staff  Vladimir Lenhoff M.D on 02/22/2015 at 6:24 PM  Between 7am to 6pm - Pager - [phone]  After 6pm go to [email or web address] - password [email or web address] [facility]  [facility]  Office  [phone]  CC: Primary care physician; No primary care provider on file.

## 2015-02-22 NOTE — Clinical Social Work Note (Signed)
CSW has made bed offers to patient and she is wanting to discuss them with her family and let CSW know in the morning. York Spaniel MSW,LCSW [phone]

## 2015-02-23 DIAGNOSIS — L89310 Pressure ulcer of right buttock, unstageable: Secondary | ICD-10-CM | POA: Diagnosis not present

## 2015-02-23 DIAGNOSIS — E0781 Sick-euthyroid syndrome: Secondary | ICD-10-CM

## 2015-02-23 DIAGNOSIS — L89152 Pressure ulcer of sacral region, stage 2: Secondary | ICD-10-CM | POA: Diagnosis not present

## 2015-02-23 DIAGNOSIS — K219 Gastro-esophageal reflux disease without esophagitis: Secondary | ICD-10-CM | POA: Diagnosis not present

## 2015-02-23 DIAGNOSIS — L03115 Cellulitis of right lower limb: Secondary | ICD-10-CM | POA: Diagnosis not present

## 2015-02-23 DIAGNOSIS — R509 Fever, unspecified: Secondary | ICD-10-CM | POA: Diagnosis not present

## 2015-02-23 DIAGNOSIS — R197 Diarrhea, unspecified: Secondary | ICD-10-CM

## 2015-02-23 DIAGNOSIS — R7881 Bacteremia: Secondary | ICD-10-CM

## 2015-02-23 DIAGNOSIS — R32 Unspecified urinary incontinence: Secondary | ICD-10-CM

## 2015-02-23 DIAGNOSIS — Z9181 History of falling: Secondary | ICD-10-CM | POA: Diagnosis not present

## 2015-02-23 DIAGNOSIS — N189 Chronic kidney disease, unspecified: Secondary | ICD-10-CM | POA: Diagnosis not present

## 2015-02-23 DIAGNOSIS — E7800 Pure hypercholesterolemia, unspecified: Secondary | ICD-10-CM | POA: Diagnosis not present

## 2015-02-23 DIAGNOSIS — R1310 Dysphagia, unspecified: Secondary | ICD-10-CM | POA: Diagnosis not present

## 2015-02-23 DIAGNOSIS — F0390 Unspecified dementia without behavioral disturbance: Secondary | ICD-10-CM | POA: Diagnosis not present

## 2015-02-23 DIAGNOSIS — G039 Meningitis, unspecified: Secondary | ICD-10-CM | POA: Diagnosis not present

## 2015-02-23 DIAGNOSIS — L89300 Pressure ulcer of unspecified buttock, unstageable: Secondary | ICD-10-CM

## 2015-02-23 DIAGNOSIS — K632 Fistula of intestine: Secondary | ICD-10-CM | POA: Diagnosis not present

## 2015-02-23 DIAGNOSIS — F419 Anxiety disorder, unspecified: Secondary | ICD-10-CM | POA: Diagnosis not present

## 2015-02-23 DIAGNOSIS — B9561 Methicillin susceptible Staphylococcus aureus infection as the cause of diseases classified elsewhere: Secondary | ICD-10-CM

## 2015-02-23 DIAGNOSIS — E119 Type 2 diabetes mellitus without complications: Secondary | ICD-10-CM

## 2015-02-23 DIAGNOSIS — Z48817 Encounter for surgical aftercare following surgery on the skin and subcutaneous tissue: Secondary | ICD-10-CM | POA: Diagnosis not present

## 2015-02-23 DIAGNOSIS — L03818 Cellulitis of other sites: Secondary | ICD-10-CM | POA: Diagnosis not present

## 2015-02-23 DIAGNOSIS — K604 Rectal fistula: Secondary | ICD-10-CM | POA: Diagnosis not present

## 2015-02-23 DIAGNOSIS — E871 Hypo-osmolality and hyponatremia: Secondary | ICD-10-CM | POA: Diagnosis not present

## 2015-02-23 DIAGNOSIS — G4700 Insomnia, unspecified: Secondary | ICD-10-CM | POA: Diagnosis not present

## 2015-02-23 DIAGNOSIS — I10 Essential (primary) hypertension: Secondary | ICD-10-CM | POA: Diagnosis not present

## 2015-02-23 DIAGNOSIS — M810 Age-related osteoporosis without current pathological fracture: Secondary | ICD-10-CM | POA: Diagnosis not present

## 2015-02-23 DIAGNOSIS — E1122 Type 2 diabetes mellitus with diabetic chronic kidney disease: Secondary | ICD-10-CM | POA: Diagnosis not present

## 2015-02-23 LAB — BASIC METABOLIC PANEL
Anion gap: 11 (ref 5–15)
BUN: 13 mg/dL (ref 6–20)
CALCIUM: 8.8 mg/dL — AB (ref 8.9–10.3)
CO2: 24 mmol/L (ref 22–32)
CREATININE: 0.98 mg/dL (ref 0.44–1.00)
Chloride: 100 mmol/L — ABNORMAL LOW (ref 101–111)
GFR, EST AFRICAN AMERICAN: 55 mL/min — AB (ref >60)
GFR, EST NON AFRICAN AMERICAN: 48 mL/min — AB (ref >60)
Glucose, Bld: 151 mg/dL — ABNORMAL HIGH (ref 65–99)
Potassium: 3.3 mmol/L — ABNORMAL LOW (ref 3.5–5.1)
SODIUM: 135 mmol/L (ref 135–145)

## 2015-02-23 LAB — PHOSPHORUS: PHOSPHORUS: 2.5 mg/dL (ref 2.5–4.6)

## 2015-02-23 LAB — WOUND CULTURE

## 2015-02-23 LAB — MAGNESIUM: MAGNESIUM: 2.1 mg/dL (ref 1.7–2.4)

## 2015-02-23 LAB — ANAEROBIC CULTURE: Special Requests: NORMAL

## 2015-02-23 MED ORDER — POTASSIUM CHLORIDE 20 MEQ/15ML (10%) PO SOLN
40.0000 meq | Freq: Once | ORAL | Status: AC
  Administered 2015-02-23: 40 meq via ORAL
  Filled 2015-02-23: qty 30

## 2015-02-23 MED ORDER — ALPRAZOLAM 0.25 MG PO TABS: 0.2500 mg | ORAL_TABLET | Freq: Two times a day (BID) | ORAL | Status: DC | PRN

## 2015-02-23 MED ORDER — METOPROLOL TARTRATE 25 MG PO TABS: 25.0000 mg | ORAL_TABLET | Freq: Two times a day (BID) | ORAL | Status: DC

## 2015-02-23 MED ORDER — CHOLESTYRAMINE LIGHT 4 G PO PACK: 4.0000 g | PACK | Freq: Four times a day (QID) | ORAL | Status: DC

## 2015-02-23 MED ORDER — ALPRAZOLAM 0.5 MG PO TABS: 0.5000 mg | ORAL_TABLET | Freq: Every day | ORAL | Status: DC

## 2015-02-23 MED ORDER — HYDRALAZINE HCL 100 MG PO TABS: 100.0000 mg | ORAL_TABLET | Freq: Three times a day (TID) | ORAL | Status: DC

## 2015-02-23 MED ORDER — HYDROCODONE-ACETAMINOPHEN 5-325 MG PO TABS: 1.0000 | ORAL_TABLET | Freq: Four times a day (QID) | ORAL | Status: DC | PRN

## 2015-02-23 MED ORDER — RISAQUAD PO CAPS: 1.0000 | ORAL_CAPSULE | Freq: Every day | ORAL | Status: DC

## 2015-02-23 MED ORDER — CLOTRIMAZOLE 1 % EX CREA: TOPICAL_CREAM | Freq: Two times a day (BID) | CUTANEOUS | Status: DC

## 2015-02-23 MED ORDER — HYDRALAZINE HCL 50 MG PO TABS
100.0000 mg | ORAL_TABLET | Freq: Three times a day (TID) | ORAL | Status: DC
  Administered 2015-02-23: 100 mg via ORAL
  Filled 2015-02-23: qty 2

## 2015-02-23 MED ORDER — METRONIDAZOLE 500 MG PO TABS: 500.0000 mg | ORAL_TABLET | Freq: Three times a day (TID) | ORAL | Status: DC

## 2015-02-23 MED ORDER — LOPERAMIDE HCL 2 MG PO CAPS: 2.0000 mg | ORAL_CAPSULE | Freq: Four times a day (QID) | ORAL | Status: DC | PRN

## 2015-02-23 MED ORDER — CEPHALEXIN 500 MG PO CAPS: 500.0000 mg | ORAL_CAPSULE | Freq: Three times a day (TID) | ORAL | Status: DC

## 2015-02-23 MED ORDER — CHOLESTYRAMINE LIGHT 4 G PO PACK
4.0000 g | PACK | Freq: Four times a day (QID) | ORAL | Status: DC
  Filled 2015-02-23 (×3): qty 1

## 2015-02-23 MED ORDER — ENSURE ENLIVE PO LIQD: 237.0000 mL | Freq: Two times a day (BID) | ORAL | Status: DC

## 2015-02-23 MED ORDER — PANTOPRAZOLE SODIUM 40 MG PO PACK: 40.0000 mg | PACK | Freq: Every day | ORAL | Status: DC

## 2015-02-23 NOTE — Care Management Important Message (Signed)
Important Message  Patient Details  Name: Gina [name] MRN: [id number] Date of Birth: [birth year]   Medicare Important Message Given:  Yes    Olegario Messier A Denicia Pagliarulo 02/23/2015, 11:19 AM

## 2015-02-23 NOTE — Clinical Social Work Note (Signed)
Patient to discharge today to return to [facility]. She will be on oral antibiotics. Patient's daughters are aware. Patient to have palliative care consult at [facility].  York Spaniel MSW,LCSW

## 2015-02-23 NOTE — Progress Notes (Signed)
Pt stable. IV removed. D/c prescriptions in the packet. Family states they understand instructions. Pt cleaned and dressed in transport gown. V/S stable. Report called to [facility] - RN to call back nurse at [facility]. EMS transport will be made.

## 2015-02-23 NOTE — Progress Notes (Signed)
Dr. Winona Legato notified of HR in the 40s, BP stable. MD acknowledged and will adjust orders. Will continue to monitor patient.

## 2015-02-23 NOTE — Progress Notes (Signed)
MEDICATION RELATED CONSULT NOTE - follow up  Pharmacy Consult for electrolyte management Indication: hypokalemia, hypomagnesemia  Allergies  Allergen Reactions  . Fosamax [Alendronate Sodium] Other (See Comments)    Reaction: Unknown    Patient Measurements: Height:  (162.6 cm) Weight: 153 lb 11.2 oz (69.718 kg) IBW/kg (Calculated) : 54.7  Vital Signs: Temp: 97.9 F (36.6 C) (02/22 0450) Temp Source: Oral (02/22 0450) BP: 182/64 mmHg (02/22 0651) Pulse Rate: 75 (02/22 0450) Intake/Output from previous day: 02/21 0701 - 02/22 0700 In: 240 [P.O.:240] Out: 1300 [Urine:1300] Intake/Output from this shift:    Labs:  Recent Labs  02/21/15 0438 02/22/15 0529 02/23/15 0636  CREATININE 0.80 0.97 0.98  MG 1.8 1.6* 2.1  PHOS 2.0* 2.5 2.5   Estimated Creatinine Clearance: 32.9 mL/min (by C-G formula based on Cr of 0.98).  Assessment: Pharmacy consulted to manage electrolytes in this 82 year old female.  Goal of Therapy:  Electrolytes within normal limits  Plan:  Electrolytes are WNL except K which is low at 3.3. Patient is receiving K  40 meq daily. Will add an additional 40 meq K today and f/u AM labs.   Luisa Hart D Clinical Pharmacist 02/23/2015,8:16 AM

## 2015-02-23 NOTE — Discharge Summary (Addendum)
[facility] - [facility]   PATIENT NAME: Gina [name]    MR#:  [id number]  DATE OF BIRTH:  08/13/1917  DATE OF ADMISSION:  02/17/2015 ADMITTING PHYSICIAN: Ihor Austin, MD  DATE OF DISCHARGE: No discharge date for patient encounter.  PRIMARY CARE PHYSICIAN: No primary care provider on file.     ADMISSION DIAGNOSIS:  Abscess of buttock, right [L02.31] Sepsis, due to unspecified organism (HCC) [A41.9]  DISCHARGE DIAGNOSIS:  Principal Problem:   Staphylococcus aureus bacteremia Active Problems:   Wound infection (HCC)   Colo-enteric fistula   Decubitus ulcer of buttock, unstageable (HCC)   Hyponatremia   Urinary incontinence   Diarrhea   Sick euthyroidism   Diabetes mellitus (HCC)   SECONDARY DIAGNOSIS:   Past Medical History  Diagnosis Date  . Diabetes mellitus without complication (HCC)   . Hypertension   . Dementia   . Anxiety   . GERD (gastroesophageal reflux disease)   . Osteoporosis   . Hypothyroidism   . Sacral decubitus ulcer     .pro HOSPITAL COURSE:  The patient is a 82 year old Caucasian female with past medical history significant for history of recent admission with a right thigh abscess status post debridement, who was readmitted with low-grade fever. Apparently patient had the wound and blood cultures positive for MSSA on her last admission, patient was in the process of placement of PICC line, receiving intramuscular ceftriaxone.Patient had diarrhea and new right buttock decubitus ulcer.  Admission labs were significant for hyponatremia with sodium level of 125, hypokalemia with potassium level of 2.9, hypomagnesemia with magnesium level of 1.3, patient's albumin level was 2.5, hemoglobin level of 10.0 . Patient underwent CT scanning of her abdomen and pelvis in the emergency room which revealed a large fistulous tract extending from lower rectum, anal region to the midline inferior sacral skin ulcer, no drainable  fluid collection or abscess were identified, sigmoid diverticulosis, thickening of gallbladder wall, liver cirrhosis. Patient was admitted to the hospital for further evaluation and treatment. She was initiated on broad-spectrum antibiotic therapy. C. difficile test was negative, urine culture was negative, MRSA PCR screening was negative, blood cultures collected on February 16 2 were negative, wound culture revealed methicillin sensitive Staphylococcus aureus resistant to ciprofloxacin but sensitive to all other antibiotics, no anaerobes were isolated.  Consultation with surgery and infectious disease specialist was obtained. Dr. Sampson Goon saw patient in consultation and felt that patient's antibiotics need to be changed to Ancef due to history of methicillin sensitive Staphylococcus aureus bacteremia, he recommended to check echocardiogram. Add Flagyl due to rectal cutaneous fistula, give fluconazole and topical clotrimazole for perineal candida infection. He also felt that patient has overall poor prognosis. Recommended palliative care. Patient was seen by surgery, we do not feel that patient is a candidate for colostomy, recommended antibiotic therapy. Patient's family decided on oral antibiotic therapy for 4 weeks from negative Blood cultures taken 16 th February, which would be through March 16. Patient's care was discussed this family who was agreeable.  Discussion by problem  1. Rectocutaneous fistula into sacral wound, growing MSSA, continue patient on Flagyl, lactobacillus. Appreciate Surgical consult, it was felt that patient is not a candidate for diverting colostomy.   2. Necrotic and infected sacral ulcer , wound cultures MSSA, continue Keflex for 24 more days through 03/17/2015 and flagyl orally, per Dr. Sampson Goon. wound care is appreciated.   3. Hyponatremia. Fluctuating sodium levels with normal saline infusion. Finally IV fluids were discontinued , sodium level  remains at 132 , follow  periodically  4. Hypertension. Continue verapamil,  hydralazine, advance as needed. Metoprolol was discontinued due to bradycardia  5. Type 2 diabetes mellitus. Metformin, sliding scale insulin while in the hospital , continue outpatient medications, no changes.  6 MSSA bacteremia- repeat bcx February 16 revealed no growth Continue Keflex for 4 weeks per Dr. Sampson Goon from negative cultures, which would be through 03/17/2015.   7 diarrhea. Stool PCR for C diff. was negative, continue cholestyramine every 6 hours,  discontinue rectal tube   8 Hypokalemia. Continue oral potassium supplementation, follow potassium level closely  9 Hypomagnesemia. Supplemented intravenously   10. Sick euthyroid, no intervention  11. Dysphagia. Continue dysphagia 3 diet   12. Failure to thrive, continue dysphagia diet. No fluid restrictions. Discontinue carbohydrate controlled diet. Palliative care follow up as outpatient  DISCHARGE CONDITIONS:   stable  CONSULTS OBTAINED:  Treatment Team:  Clydie Braun, MD  DRUG ALLERGIES:   Allergies  Allergen Reactions  . Fosamax [Alendronate Sodium] Other (See Comments)    Reaction: Unknown    DISCHARGE MEDICATIONS:   Current Discharge Medication List    START taking these medications   Details  acidophilus (RISAQUAD) CAPS capsule Take 1 capsule by mouth daily. Qty: 30 capsule, Refills: 5    cholestyramine light (PREVALITE) 4 g packet Take 1 packet (4 g total) by mouth every 6 (six) hours. Qty: 120 packet, Refills: 5    clotrimazole (LOTRIMIN) 1 % cream Apply topically 2 (two) times daily. Qty: 30 g, Refills: 0    feeding supplement, ENSURE ENLIVE, (ENSURE ENLIVE) LIQD Take 237 mLs by mouth 2 (two) times daily between meals. Qty: 237 mL, Refills: 12    hydrALAZINE (APRESOLINE) 100 MG tablet Take 1 tablet (100 mg total) by mouth every 8 (eight) hours. Qty: 90 tablet, Refills: 5    metoprolol tartrate (LOPRESSOR) 25 MG tablet Take 1 tablet  (25 mg total) by mouth 2 (two) times daily. Qty: 60 tablet, Refills: 5    metroNIDAZOLE (FLAGYL) 500 MG tablet Take 1 tablet (500 mg total) by mouth 3 (three) times daily. Qty: 90 tablet, Refills: 5    pantoprazole sodium (PROTONIX) 40 mg/20 mL PACK Take 20 mLs (40 mg total) by mouth daily. Qty: 30 each, Refills: 5      CONTINUE these medications which have CHANGED   Details  !! ALPRAZolam (XANAX) 0.25 MG tablet Take 1 tablet (0.25 mg total) by mouth 2 (two) times daily as needed for anxiety. Qty: 14 tablet, Refills: 0    !! ALPRAZolam (XANAX) 0.5 MG tablet Take 1 tablet (0.5 mg total) by mouth at bedtime. Qty: 10 tablet, Refills: 0    cephALEXin (KEFLEX) 500 MG capsule Take 1 capsule (500 mg total) by mouth 3 (three) times daily. Qty: 75 capsule, Refills: 0    HYDROcodone-acetaminophen (NORCO/VICODIN) 5-325 MG tablet Take 1 tablet by mouth every 6 (six) hours as needed for moderate pain. Qty: 16 tablet, Refills: 0     !! - Potential duplicate medications found. Please discuss with provider.    CONTINUE these medications which have NOT CHANGED   Details  acetaminophen (TYLENOL) 325 MG tablet Take by mouth every 4 (four) hours as needed for mild pain, moderate pain or fever.    atorvastatin (LIPITOR) 10 MG tablet Take 10 mg by mouth daily at 6 PM.     calcium-vitamin D (OSCAL WITH D) 500-200 MG-UNIT tablet Take 1 tablet by mouth 2 (two) times daily. Qty: 60 tablet, Refills: 0  Cholecalciferol (VITAMIN D3) 50000 units CAPS Take 1 capsule by mouth once a week. Takes on Saturday    ferrous sulfate 325 (65 FE) MG tablet Take 1 tablet (325 mg total) by mouth 2 (two) times daily with a meal. Qty: 60 tablet, Refills: 3    levothyroxine (SYNTHROID, LEVOTHROID) 137 MCG tablet Take 137 mcg by mouth daily before breakfast.    loperamide (IMODIUM) 2 MG capsule Take 2 mg by mouth every 4 (four) hours as needed for diarrhea or loose stools.     Menthol-Zinc Oxide (REMEDY CALAZIME)  0.2-20 % PSTE Apply 1 application topically as needed (skin irritation). Apply to excoriated areas on sacrum    metFORMIN (GLUCOPHAGE) 500 MG tablet Take 500 mg by mouth 2 (two) times daily with a meal.    nystatin cream (MYCOSTATIN) Apply 1 application topically as needed (rash). Apply to groin    omeprazole (PRILOSEC) 20 MG capsule Take 20 mg by mouth daily.    sodium chloride (OCEAN) 0.65 % SOLN nasal spray Place 2 sprays into both nostrils every 6 (six) hours as needed for congestion.     sodium chloride 1 G tablet Take 1 g by mouth 2 (two) times daily with a meal.    traZODone (DESYREL) 50 MG tablet Take 50 mg by mouth at bedtime.    verapamil (VERELAN PM) 240 MG 24 hr capsule Take 240 mg by mouth daily.     vitamin C (ASCORBIC ACID) 500 MG tablet Take 500 mg by mouth daily.      STOP taking these medications     cefTRIAXone (ROCEPHIN) 1 g injection      ranitidine (ZANTAC) 150 MG tablet      senna (SENOKOT) 8.6 MG TABS tablet          DISCHARGE INSTRUCTIONS:    Follow-up with primary care physician, infectious disease specialist, Dr. Sampson Goon   If you experience worsening of your admission symptoms, develop shortness of breath, life threatening emergency, suicidal or homicidal thoughts you must seek medical attention immediately by calling 911 or calling your MD immediately  if symptoms less severe.  You Must read complete instructions/literature along with all the possible adverse reactions/side effects for all the Medicines you take and that have been prescribed to you. Take any new Medicines after you have completely understood and accept all the possible adverse reactions/side effects.   Please note  You were cared for by a hospitalist during your hospital stay. If you have any questions about your discharge medications or the care you received while you were in the hospital after you are discharged, you can call the unit and asked to speak with the hospitalist  on call if the hospitalist that took care of you is not available. Once you are discharged, your primary care physician will handle any further medical issues. Please note that NO REFILLS for any discharge medications will be authorized once you are discharged, as it is imperative that you return to your primary care physician (or establish a relationship with a primary care physician if you do not have one) for your aftercare needs so that they can reassess your need for medications and monitor your lab values.    Today   CHIEF COMPLAINT:   Chief Complaint  Patient presents with  . Wound Infection    HISTORY OF PRESENT ILLNESS:  Gina [name]  is a 82 y.o. female with a known history of recent admission with a right thigh abscess status post debridement, who was  readmitted with low-grade fever. Apparently patient had the wound and blood cultures positive for MSSA on her last admission, patient was in the process of placement of PICC line, receiving intramuscular ceftriaxone.Patient had diarrhea and new right buttock decubitus ulcer.  Admission labs were significant for hyponatremia with sodium level of 125, hypokalemia with potassium level of 2.9, hypomagnesemia with magnesium level of 1.3, patient's albumin level was 2.5, hemoglobin level of 10.0 . Patient underwent CT scanning of her abdomen and pelvis in the emergency room which revealed a large fistulous tract extending from lower rectum, anal region to the midline inferior sacral skin ulcer, no drainable fluid collection or abscess were identified, sigmoid diverticulosis, thickening of gallbladder wall, liver cirrhosis. Patient was admitted to the hospital for further evaluation and treatment. She was initiated on broad-spectrum antibiotic therapy. C. difficile test was negative, urine culture was negative, MRSA PCR screening was negative, blood cultures collected on February 16 2 were negative, wound culture revealed methicillin sensitive  Staphylococcus aureus resistant to ciprofloxacin but sensitive to all other antibiotics, no anaerobes were isolated.  Consultation with surgery and infectious disease specialist was obtained. Dr. Sampson Goon saw patient in consultation and felt that patient's antibiotics need to be changed to Ancef due to history of methicillin sensitive Staphylococcus aureus bacteremia, he recommended to check echocardiogram. Add Flagyl due to rectal cutaneous fistula, give fluconazole and topical clotrimazole for perineal candida infection. He also felt that patient has overall poor prognosis. Recommended palliative care. Patient was seen by surgery, we do not feel that patient is a candidate for colostomy, recommended antibiotic therapy. Patient's family decided on oral antibiotic therapy for 4 weeks from negative Blood cultures taken 16 th February, which would be through March 16. Patient's care was discussed this family who was agreeable.  Discussion by problem  1. Rectocutaneous fistula into sacral wound, growing MSSA, continue patient on Flagyl, lactobacillus. Appreciate Surgical consult, it was felt that patient is not a candidate for diverting colostomy.   2. Necrotic and infected sacral ulcer , wound cultures MSSA, continue Keflex for 24 more days through 03/17/2015 and flagyl orally, per Dr. Sampson Goon. wound care is appreciated.   3. Hyponatremia. Fluctuating sodium levels with normal saline infusion. Finally IV fluids were discontinued , sodium level remains at 132 , follow periodically  4. Hypertension. Continue verapamil, metoprolol,, hydralazine, advance as needed. Metoprolol was discontinued due to bradycardia  5. Type 2 diabetes mellitus. Metformin, sliding scale insulin while in the hospital , continue outpatient medications, no changes.  6 MSSA bacteremia- repeat bcx February 16 revealed no growth Continue Keflex for 4 weeks per Dr. Sampson Goon from negative cultures, which would be through  03/17/2015.   7 diarrhea. Stool PCR for C diff. was negative, continue cholestyramine every 6 hours,  discontinue rectal tube   8 Hypokalemia. Continue oral potassium supplementation, follow potassium level closely  9 Hypomagnesemia. Supplemented intravenously   10. Sick euthyroid, no intervention  11. Dysphagia. Continue dysphagia 3 diet   12. Failure to thrive, continue dysphagia diet. No fluid restrictions. Discontinue carbohydrate controlled diet. Palliative care follow up as outpatient   VITAL SIGNS:  Blood pressure 182/64, pulse 75, temperature 97.9 F (36.6 C), temperature source Oral, resp. rate 20, height 5\' 4"  (1.626 m), weight 69.718 kg (153 lb 11.2 oz), SpO2 95 %.  I/O:   Intake/Output Summary (Last 24 hours) at 02/23/15 1024 Last data filed at 02/23/15 0900  Gross per 24 hour  Intake    240 ml  Output  1300 ml  Net  -1060 ml    PHYSICAL EXAMINATION:  GENERAL:  82 y.o.-year-old patient lying in the bed with no acute distress.  EYES: Pupils equal, round, reactive to light and accommodation. No scleral icterus. Extraocular muscles intact.  HEENT: Head atraumatic, normocephalic. Oropharynx and nasopharynx clear.  NECK:  Supple, no jugular venous distention. No thyroid enlargement, no tenderness.  LUNGS: Normal breath sounds bilaterally, no wheezing, rales,rhonchi or crepitation. No use of accessory muscles of respiration.  CARDIOVASCULAR: S1, S2 normal. No murmurs, rubs, or gallops.  ABDOMEN: Soft, non-tender, non-distended. Bowel sounds present. No organomegaly or mass.  EXTREMITIES: No pedal edema, cyanosis, or clubbing.  NEUROLOGIC: Cranial nerves II through XII are intact. Muscle strength 5/5 in all extremities. Sensation intact. Gait not checked.  PSYCHIATRIC: The patient is alert and oriented x 3.  SKIN: No obvious rash, lesion, or ulcer.   DATA REVIEW:   CBC  Recent Labs Lab 02/19/15 0452  WBC 6.6  HGB 11.3*  HCT 33.3*  PLT 168    Chemistries    Recent Labs Lab 02/17/15 2042 02/17/15 2234  02/23/15 0636  NA 124*  --   < > 135  K 3.4*  --   < > 3.3*  CL 87*  --   < > 100*  CO2 22  --   < > 24  GLUCOSE 118*  --   < > 151*  BUN 16  --   < > 13  CREATININE 0.92  --   < > 0.98  CALCIUM 8.4*  --   < > 8.8*  MG  --   --   < > 2.1  AST 59*  --   --   --   ALT 23  --   --   --   ALKPHOS 285*  --   --   --   BILITOT UNABLE TO REPORT DUE TO QUANTITY OF SAMPLE 0.5  --   --   < > = values in this interval not displayed.  Cardiac Enzymes No results for input(s): TROPONINI in the last 168 hours.  Microbiology Results  Results for orders placed or performed during the hospital encounter of 02/17/15  Blood Culture (routine x 2)     Status: None   Collection Time: 02/17/15  8:42 PM  Result Value Ref Range Status   Specimen Description BLOOD LEFT ANTECUBITAL  Final   Special Requests BOTTLES DRAWN AEROBIC AND ANAEROBIC  Final   Culture NO GROWTH 5 DAYS  Final   Report Status 02/22/2015 FINAL  Final  Blood Culture (routine x 2)     Status: None   Collection Time: 02/17/15  8:45 PM  Result Value Ref Range Status   Specimen Description BLOOD LEFT HAND  Final   Special Requests BOTTLES DRAWN AEROBIC AND ANAEROBIC  Final   Culture NO GROWTH 5 DAYS  Final   Report Status 02/22/2015 FINAL  Final  Urine culture     Status: None   Collection Time: 02/17/15  9:05 PM  Result Value Ref Range Status   Specimen Description URINE, RANDOM  Final   Special Requests NONE  Final   Culture NO GROWTH 2 DAYS  Final   Report Status 02/19/2015 FINAL  Final  C difficile quick scan w PCR reflex     Status: None   Collection Time: 02/18/15  3:00 PM  Result Value Ref Range Status   C Diff antigen NEGATIVE NEGATIVE Final   C Diff toxin  NEGATIVE NEGATIVE Final   C Diff interpretation Negative for C. difficile  Final  Anaerobic culture     Status: None   Collection Time: 02/19/15 12:20 AM  Result Value Ref Range Status   Specimen  Description WOUND RIGHT SACRAL  Final   Special Requests Normal  Final   Culture NO ANAEROBES ISOLATED  Final   Report Status 02/23/2015 FINAL  Final  Wound culture     Status: None   Collection Time: 02/19/15 12:20 AM  Result Value Ref Range Status   Specimen Description SACRAL RIGHT SACRAL WOUND, RECTAL FISTUAL TRACT  Final   Special Requests NONE  Final   Gram Stain RARE WBC SEEN FEW GRAM POSITIVE COCCI IN PAIRS   Final   Culture   Final    MODERATE GROWTH STAPHYLOCOCCUS AUREUS LIGHT GROWTH CANDIDA ALBICANS    Report Status 02/23/2015 FINAL  Final   Organism ID, Bacteria STAPHYLOCOCCUS AUREUS  Final      Susceptibility   Staphylococcus aureus - MIC*    CIPROFLOXACIN >=8 RESISTANT Resistant     ERYTHROMYCIN >=8 RESISTANT Resistant     GENTAMICIN <=0.5 SENSITIVE Sensitive     OXACILLIN 0.5 SENSITIVE Sensitive     TETRACYCLINE <=1 SENSITIVE Sensitive     VANCOMYCIN 1 SENSITIVE Sensitive     TRIMETH/SULFA <=10 SENSITIVE Sensitive     CLINDAMYCIN <=0.25 SENSITIVE Sensitive     RIFAMPIN <=0.5 SENSITIVE Sensitive     Inducible Clindamycin NEGATIVE Sensitive     * MODERATE GROWTH STAPHYLOCOCCUS AUREUS  MRSA PCR Screening     Status: None   Collection Time: 02/21/15  7:05 PM  Result Value Ref Range Status   MRSA by PCR NEGATIVE NEGATIVE Final    Comment:        The GeneXpert MRSA Assay (FDA approved for NASAL specimens only), is one component of a comprehensive MRSA colonization surveillance program. It is not intended to diagnose MRSA infection nor to guide or monitor treatment for MRSA infections.     RADIOLOGY:  No results found.  EKG:   Orders placed or performed during the hospital encounter of 10/20/14  . EKG 12-Lead  . EKG 12-Lead      Management plans discussed with the patient, family and they are in agreement.  CODE STATUS:     Code Status Orders        Start     Ordered   02/18/15 0144  Do not attempt resuscitation (DNR)   Continuous     Question Answer Comment  In the event of cardiac or respiratory ARREST Do not call a "code blue"   In the event of cardiac or respiratory ARREST Do not perform Intubation, CPR, defibrillation or ACLS   In the event of cardiac or respiratory ARREST Use medication by any route, position, wound care, and other measures to relive pain and suffering. May use oxygen, suction and manual treatment of airway obstruction as needed for comfort.      02/18/15 0143    Code Status History    Date Active Date Inactive Code Status Order ID Comments User Context   02/12/2015  3:36 AM 02/14/2015  5:47 PM DNR [id number]  Milagros Loll, MD ED   10/21/2014  1:26 PM 10/25/2014  3:38 PM DNR [id number]  Deeann Saint, MD Inpatient   10/20/2014  6:43 PM 10/21/2014  1:26 PM DNR [id number]  Ramonita Lab, MD Inpatient   10/20/2014  3:17 PM 10/20/2014  6:43 PM Full Code [id number]  Deeann Saint,  MD Inpatient      TOTAL TIME TAKING CARE OF THIS PATIENT: 50 minutes.  About 10 minutes as presented on discussion with family   Dartanion Teo M.D on 02/23/2015 at 10:24 AM  Between 7am to 6pm - Pager - [phone]  After 6pm go to [email or web address] - password [email or web address] [facility]  [facility]  Office  [phone]  CC: Primary care physician; No primary care provider on file.

## 2015-02-25 DIAGNOSIS — F0390 Unspecified dementia without behavioral disturbance: Secondary | ICD-10-CM | POA: Diagnosis not present

## 2015-02-25 DIAGNOSIS — R509 Fever, unspecified: Secondary | ICD-10-CM | POA: Diagnosis not present

## 2015-02-25 DIAGNOSIS — I10 Essential (primary) hypertension: Secondary | ICD-10-CM | POA: Diagnosis not present

## 2015-03-02 DIAGNOSIS — Z9181 History of falling: Secondary | ICD-10-CM | POA: Diagnosis not present

## 2015-03-02 DIAGNOSIS — R7881 Bacteremia: Secondary | ICD-10-CM | POA: Diagnosis not present

## 2015-03-02 DIAGNOSIS — E7800 Pure hypercholesterolemia, unspecified: Secondary | ICD-10-CM | POA: Diagnosis not present

## 2015-03-02 DIAGNOSIS — E1122 Type 2 diabetes mellitus with diabetic chronic kidney disease: Secondary | ICD-10-CM | POA: Diagnosis not present

## 2015-03-02 DIAGNOSIS — R1310 Dysphagia, unspecified: Secondary | ICD-10-CM | POA: Diagnosis not present

## 2015-03-02 DIAGNOSIS — L89310 Pressure ulcer of right buttock, unstageable: Secondary | ICD-10-CM | POA: Diagnosis not present

## 2015-03-02 DIAGNOSIS — G4700 Insomnia, unspecified: Secondary | ICD-10-CM | POA: Diagnosis not present

## 2015-03-02 DIAGNOSIS — K219 Gastro-esophageal reflux disease without esophagitis: Secondary | ICD-10-CM | POA: Diagnosis not present

## 2015-03-02 DIAGNOSIS — F419 Anxiety disorder, unspecified: Secondary | ICD-10-CM | POA: Diagnosis not present

## 2015-03-02 DIAGNOSIS — M810 Age-related osteoporosis without current pathological fracture: Secondary | ICD-10-CM | POA: Diagnosis not present

## 2015-03-02 DIAGNOSIS — N189 Chronic kidney disease, unspecified: Secondary | ICD-10-CM | POA: Diagnosis not present

## 2015-03-02 DIAGNOSIS — L03115 Cellulitis of right lower limb: Secondary | ICD-10-CM | POA: Diagnosis not present

## 2015-03-02 DIAGNOSIS — E0781 Sick-euthyroid syndrome: Secondary | ICD-10-CM | POA: Diagnosis not present

## 2015-03-02 DIAGNOSIS — F0390 Unspecified dementia without behavioral disturbance: Secondary | ICD-10-CM | POA: Diagnosis not present

## 2015-03-02 DIAGNOSIS — Z48817 Encounter for surgical aftercare following surgery on the skin and subcutaneous tissue: Secondary | ICD-10-CM | POA: Diagnosis not present

## 2015-03-02 DIAGNOSIS — E039 Hypothyroidism, unspecified: Secondary | ICD-10-CM | POA: Diagnosis not present

## 2015-03-02 DIAGNOSIS — K632 Fistula of intestine: Secondary | ICD-10-CM | POA: Diagnosis not present

## 2015-03-02 DIAGNOSIS — L89152 Pressure ulcer of sacral region, stage 2: Secondary | ICD-10-CM | POA: Diagnosis not present

## 2015-03-06 NOTE — Telephone Encounter (Signed)
error 

## 2015-04-01 DIAGNOSIS — I10 Essential (primary) hypertension: Secondary | ICD-10-CM | POA: Diagnosis not present

## 2015-04-01 DIAGNOSIS — F0390 Unspecified dementia without behavioral disturbance: Secondary | ICD-10-CM | POA: Diagnosis not present

## 2015-04-01 DIAGNOSIS — E119 Type 2 diabetes mellitus without complications: Secondary | ICD-10-CM | POA: Diagnosis not present

## 2015-04-08 IMAGING — CR DG CHEST 1V PORT
1 series · 1 of 1 positions shown · non-contrast
Comparison: 08/22/2013 and 06/27/2013

CLINICAL DATA: Confusion.  Altered mental status.  Weakness.

EXAM:
PORTABLE CHEST - 1 VIEW

[ap]
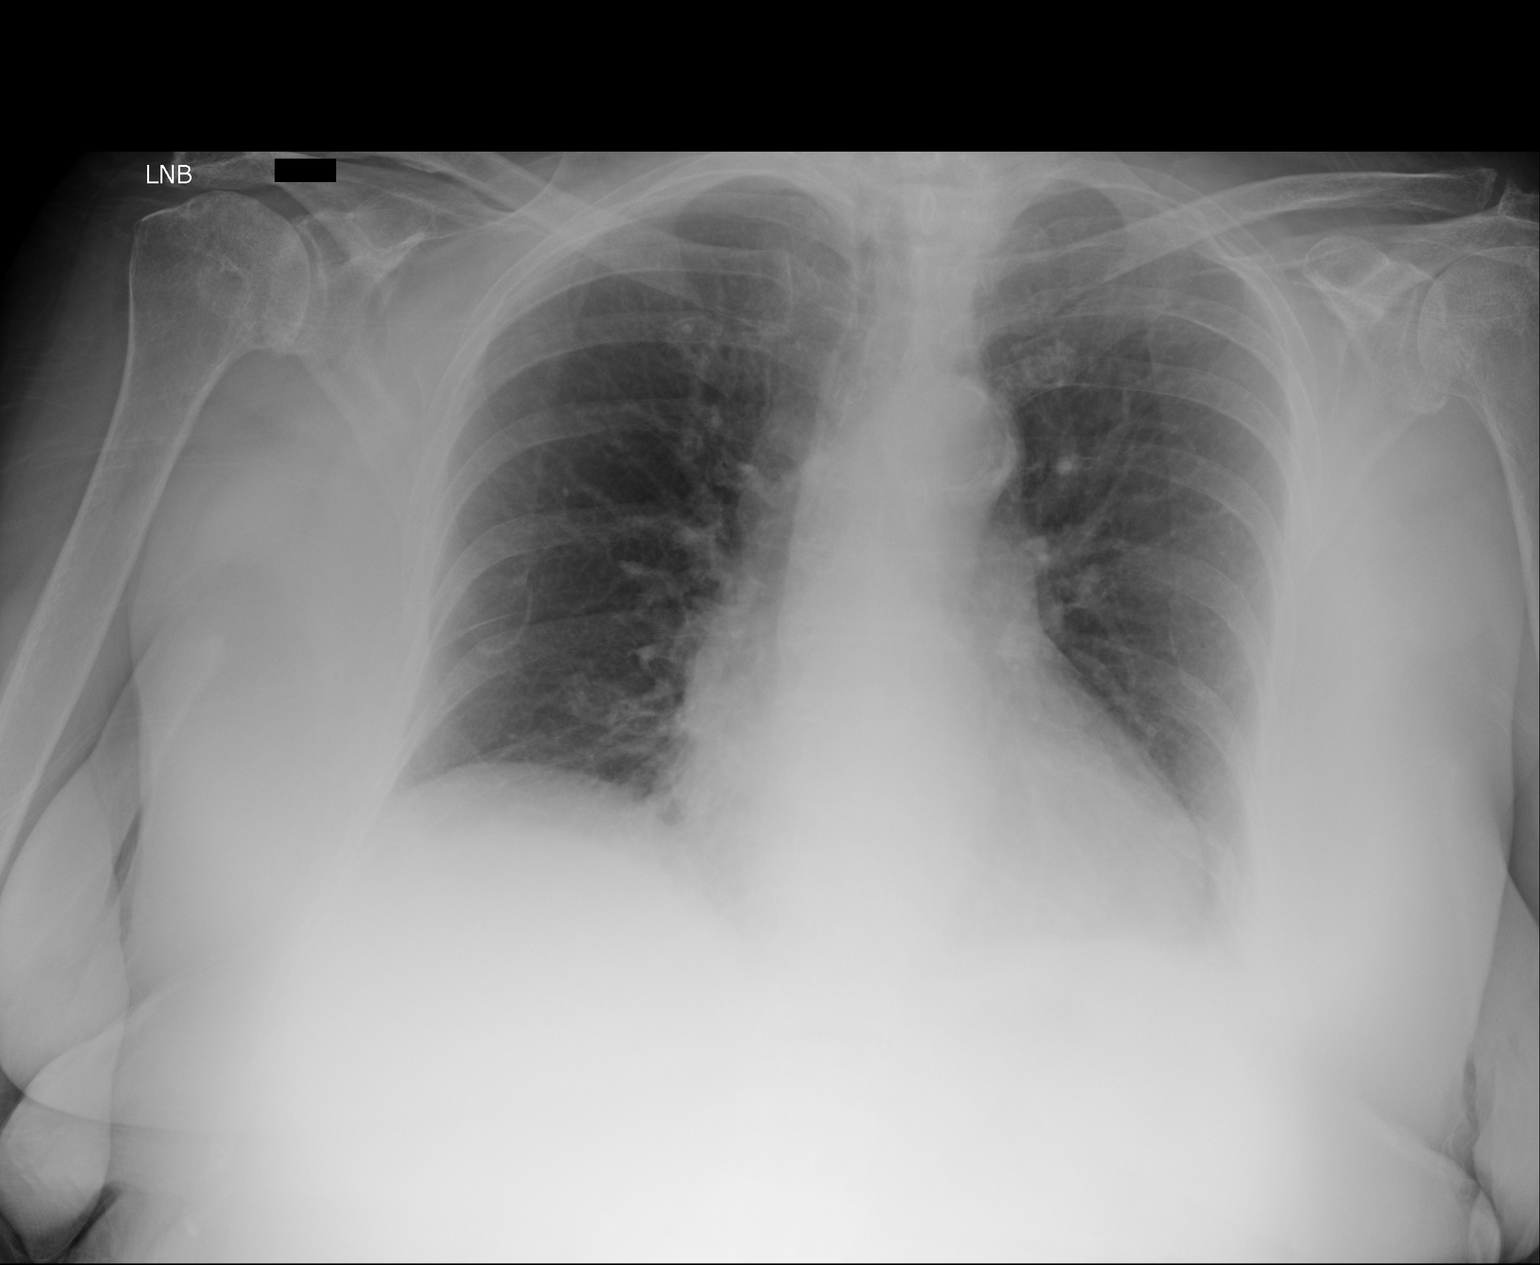

[1 of 1 positions shown; findings below may reference images not displayed]

FINDINGS: Heart size and pulmonary vascularity are normal and the lungs are
clear. No acute osseous abnormality. Slight arthritic changes of
both shoulders.
IMPRESSION: No acute abnormalities.

## 2015-05-06 DIAGNOSIS — L0390 Cellulitis, unspecified: Secondary | ICD-10-CM | POA: Diagnosis not present

## 2015-05-17 DIAGNOSIS — E785 Hyperlipidemia, unspecified: Secondary | ICD-10-CM | POA: Diagnosis not present

## 2015-05-17 DIAGNOSIS — E559 Vitamin D deficiency, unspecified: Secondary | ICD-10-CM | POA: Diagnosis not present

## 2015-05-17 DIAGNOSIS — E119 Type 2 diabetes mellitus without complications: Secondary | ICD-10-CM | POA: Diagnosis not present

## 2015-05-18 DIAGNOSIS — R1312 Dysphagia, oropharyngeal phase: Secondary | ICD-10-CM | POA: Diagnosis not present

## 2015-06-09 ENCOUNTER — Other Ambulatory Visit
Admission: RE | Admit: 2015-06-09 | Discharge: 2015-06-09 | Disposition: A | Discharge status: Home / Self Care | Payer: PPO | Source: Ambulatory Visit | Attending: Surgery | Admitting: Surgery

## 2015-06-09 ENCOUNTER — Encounter: Payer: PPO | Attending: Surgery | Admitting: Surgery

## 2015-06-09 DIAGNOSIS — L89613 Pressure ulcer of right heel, stage 3: Secondary | ICD-10-CM | POA: Insufficient documentation

## 2015-06-09 DIAGNOSIS — E1122 Type 2 diabetes mellitus with diabetic chronic kidney disease: Secondary | ICD-10-CM | POA: Insufficient documentation

## 2015-06-09 DIAGNOSIS — I129 Hypertensive chronic kidney disease with stage 1 through stage 4 chronic kidney disease, or unspecified chronic kidney disease: Secondary | ICD-10-CM | POA: Diagnosis not present

## 2015-06-09 DIAGNOSIS — S91301A Unspecified open wound, right foot, initial encounter: Secondary | ICD-10-CM | POA: Insufficient documentation

## 2015-06-09 DIAGNOSIS — E11621 Type 2 diabetes mellitus with foot ulcer: Secondary | ICD-10-CM | POA: Insufficient documentation

## 2015-06-09 DIAGNOSIS — E7800 Pure hypercholesterolemia, unspecified: Secondary | ICD-10-CM | POA: Diagnosis not present

## 2015-06-09 DIAGNOSIS — M810 Age-related osteoporosis without current pathological fracture: Secondary | ICD-10-CM | POA: Insufficient documentation

## 2015-06-09 DIAGNOSIS — K219 Gastro-esophageal reflux disease without esophagitis: Secondary | ICD-10-CM | POA: Insufficient documentation

## 2015-06-09 DIAGNOSIS — F0390 Unspecified dementia without behavioral disturbance: Secondary | ICD-10-CM | POA: Diagnosis not present

## 2015-06-09 DIAGNOSIS — N189 Chronic kidney disease, unspecified: Secondary | ICD-10-CM | POA: Diagnosis not present

## 2015-06-09 DIAGNOSIS — E039 Hypothyroidism, unspecified: Secondary | ICD-10-CM | POA: Diagnosis not present

## 2015-06-09 DIAGNOSIS — L97411 Non-pressure chronic ulcer of right heel and midfoot limited to breakdown of skin: Secondary | ICD-10-CM | POA: Diagnosis not present

## 2015-06-10 NOTE — Progress Notes (Signed)
Gina, [name] ([id number]) Visit Report for 06/09/2015 Allergy List Details Patient Name: Gina [name], Gina [name]. Date of Service: 06/09/2015 9:30 AM Medical Record Number: [id number] Patient Account Number: [id number] Date of Birth/Sex: 01-24-1917 (82 y.o. Female) Treating RN: Phillis Haggis Primary Care Physician: Einar Crow Other Clinician: Referring Physician: Wonda Cheng Treating Physician/Extender: Rudene Re in Treatment: 0 Allergies Active Allergies No Known Drug Allergies Allergy Notes Electronic Signature(s) Signed: 06/09/2015 4:02:17 PM By: Alejandro Mulling Entered By: Alejandro Mulling on 06/09/2015 09:49:24 Gina [name] ([id number]) -------------------------------------------------------------------------------- Arrival Information Details Patient Name: Gina [name] Date of Service: 06/09/2015 9:30 AM Medical Record Number: [id number] Patient Account Number: [id number] Date of Birth/Sex: Jan 31, 1917 (82 y.o. Female) Treating RN: Ashok Cordia, Debi Primary Care Physician: Einar Crow Other Clinician: Referring Physician: Wonda Cheng Treating Physician/Extender: Rudene Re in Treatment: 0 Visit Information Patient Arrived: Wheel Chair Arrival Time: 09:47 Accompanied By: daughter Transfer Assistance: EasyPivot Patient Lift Patient Identification Verified: Yes Secondary Verification Yes Process Completed: Patient Requires No Transmission-Based Precautions: Patient Has Alerts: Yes Patient Alerts: DM II ABI right noncompressible History Since Last Visit All ordered tests and consults were No completed: Added or deleted any medications: No Any new allergies or adverse reactions: No Had a fall or experienced change in No activities of daily living that may affect risk of falls: Signs or symptoms of abuse/neglect No since last visito Hospitalized since last visit: No Pain Present Now: Unable to Electronic  Signature(s) Signed: 06/09/2015 4:02:17 PM By: Alejandro Mulling Entered By: Alejandro Mulling on 06/09/2015 10:06:37 Gina [name] ([id number]) -------------------------------------------------------------------------------- Clinic Level of Care Assessment Details Patient Name: Gina [name] Date of Service: 06/09/2015 9:30 AM Medical Record Number: [id number] Patient Account Number: [id number] Date of Birth/Sex: 1917-04-12 (82 y.o. Female) Treating RN: Ashok Cordia, Debi Primary Care Physician: Einar Crow Other Clinician: Referring Physician: Wonda Cheng Treating Physician/Extender: Rudene Re in Treatment: 0 Clinic Level of Care Assessment Items TOOL 1 Quantity Score X - Use when EandM and Procedure is performed on INITIAL visit 1 0 ASSESSMENTS - Nursing Assessment / Reassessment X - General Physical Exam (combine w/ comprehensive assessment (listed just 1 20 below) when performed on new pt. evals) X - Comprehensive Assessment (HX, ROS, Risk Assessments, Wounds Hx, etc.) 1 25 ASSESSMENTS - Wound and Skin Assessment / Reassessment []  - Dermatologic / Skin Assessment (not related to wound area) 0 ASSESSMENTS - Ostomy and/or Continence Assessment and Care X - Incontinence Assessment and Management 1 10 []  - Ostomy Care Assessment and Management (repouching, etc.) 0 PROCESS - Coordination of Care []  - Simple Patient / Family Education for ongoing care 0 X - Complex (extensive) Patient / Family Education for ongoing care 1 20 X - Staff obtains Chiropractor, Records, Test Results / Process Orders 1 10 X - Staff telephones HHA, Nursing Homes / Clarify orders / etc 1 10 []  - Routine Transfer to another Facility (non-emergent condition) 0 []  - Routine Hospital Admission (non-emergent condition) 0 X - New Admissions / Manufacturing engineer / Ordering NPWT, Apligraf, etc. 1 15 []  - Emergency Hospital Admission (emergent condition) 0 PROCESS - Special Needs []  -  Pediatric / Minor Patient Management 0 []  - Isolation Patient Management 0 TERIANNA, PEGGS ([id number]) []  - Hearing / Language / Visual special needs 0 []  - Assessment of Community assistance (transportation, D/C planning, etc.) 0 []  - Additional assistance / Altered mentation 0 []  - Support Surface(s) Assessment (bed, cushion, seat, etc.) 0 INTERVENTIONS - Miscellaneous []  - External ear exam 0 []  -  Patient Transfer (multiple staff / Nurse, adult / Similar devices) 0 []  - Simple Staple / Suture removal (25 or less) 0 []  - Complex Staple / Suture removal (26 or more) 0 []  - Hypo/Hyperglycemic Management (do not check if billed separately) 0 X - Ankle / Brachial Index (ABI) - do not check if billed separately 1 15 Has the patient been seen at the hospital within the last three years: Yes Total Score: 125 Level Of Care: New/Established - Level 4 Electronic Signature(s) Signed: 06/09/2015 4:02:17 PM By: Alejandro Mulling Entered By: Alejandro Mulling on 06/09/2015 11:51:44 Gina [name] ([id number]) -------------------------------------------------------------------------------- Encounter Discharge Information Details Patient Name: Gina [name] Date of Service: 06/09/2015 9:30 AM Medical Record Number: [id number] Patient Account Number: [id number] Date of Birth/Sex: 10-Aug-1917 (82 y.o. Female) Treating RN: Ashok Cordia, Debi Primary Care Physician: Einar Crow Other Clinician: Referring Physician: Wonda Cheng Treating Physician/Extender: Rudene Re in Treatment: 0 Encounter Discharge Information Items Discharge Pain Level: 0 Discharge Condition: Stable Ambulatory Status: Wheelchair Discharge Destination: Nursing Home Transportation: Other Accompanied By: daughter Schedule Follow-up Appointment: Yes Medication Reconciliation completed No and provided to Patient/Care Jourdan Durbin: Provided on Clinical Summary of Care: 06/09/2015 Form Type Recipient Paper  Patient LW Electronic Signature(s) Signed: 06/09/2015 10:53:56 AM By: Gwenlyn Perking Entered By: Gwenlyn Perking on 06/09/2015 10:53:56 Gina [name] ([id number]) -------------------------------------------------------------------------------- Lower Extremity Assessment Details Patient Name: Gina [name] Date of Service: 06/09/2015 9:30 AM Medical Record Number: [id number] Patient Account Number: [id number] Date of Birth/Sex: 1917-02-16 (82 y.o. Female) Treating RN: Ashok Cordia, Debi Primary Care Physician: Einar Crow Other Clinician: Referring Physician: Wonda Cheng Treating Physician/Extender: Rudene Re in Treatment: 0 Edema Assessment Assessed: [Left: No] [Right: No] Edema: [Left: Yes] [Right: Yes] Calf Left: Right: Point of Measurement: 34 cm From Medial Instep 33 cm cm Ankle Left: Right: Point of Measurement: 10 cm From Medial Instep 22.5 cm cm Vascular Assessment Pulses: Posterior Tibial Dorsalis Pedis Palpable: [Right:Yes] Extremity colors, hair growth, and conditions: Extremity Color: [Left:Red] [Right:Normal] Hair Growth on Extremity: [Left:No] [Right:No] Temperature of Extremity: [Left:Cool] [Right:Cool] Capillary Refill: [Left:> 3 seconds] [Right:> 3 seconds] Blood Pressure: Brachial: [Left:137] [Right:137] Dorsalis Pedis: 150 [Left:Dorsalis Pedis:] Ankle: Posterior Tibial: [Left:Posterior Tibial: 1.09] Toe Nail Assessment Left: Right: Thick: Yes Yes Discolored: Yes Yes Deformed: Yes Yes Improper Length and Hygiene: No No Notes ABI- right non-compressible ROZINA, POINTER ([id number]) Electronic Signature(s) Signed: 06/09/2015 4:02:17 PM By: Alejandro Mulling Entered By: Alejandro Mulling on 06/09/2015 10:06:05 Gina [name] ([id number]) -------------------------------------------------------------------------------- Multi Wound Chart Details Patient Name: Gina [name] Date of Service: 06/09/2015 9:30 AM Medical  Record Number: [id number] Patient Account Number: [id number] Date of Birth/Sex: 1917-08-09 (82 y.o. 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] [N/A:N/A] Secondary Etiology: [6:Pressure Ulcer] [N/A:N/A] Comorbid History: [6:Cataracts, Chronic sinus problems/congestion, Hypertension, Type II Diabetes, Osteoarthritis, Dementia, Seizure Disorder] [N/A:N/A] Date Acquired: [6:02/02/2015] [N/A:N/A] Weeks of Treatment: [6:0] [N/A:N/A] Wound Status: [6:Open] [N/A:N/A] Measurements L x W x D 2.3x2.5x0.3 [N/A:N/A] (cm) Area (cm) : [6:4.516] [N/A:N/A] Volume (cm) : [6:1.355] [N/A:N/A] Classification: [6:Grade 1] [N/A:N/A] Exudate Amount: [6:Large] [N/A:N/A] Exudate Type: [6:Serous] [N/A:N/A] Exudate Color: [6:amber] [N/A:N/A] Foul Odor After [6:Yes] [N/A:N/A] Cleansing: Odor Anticipated  Due to No [N/A:N/A] Product Use: Wound Margin: [6:Thickened] [N/A:N/A] Granulation Amount: [6:None Present (0%)] [N/A:N/A] Necrotic Amount: [6:Large (67-100%)] [N/A:N/A] Necrotic Tissue: Eschar, Adherent Slough N/A N/A Exposed Structures: Fascia: No N/A N/A Fat: No Tendon: No Muscle: No Joint: No Bone: No Limited to Skin Breakdown Epithelialization: None N/A N/A Periwound Skin Texture: Edema: Yes N/A N/A Periwound Skin Moist: Yes N/A N/A Moisture: Periwound Skin Color: Erythema: Yes N/A N/A Erythema Location: Circumferential N/A N/A Temperature: No Abnormality N/A N/A Tenderness  on Yes N/A N/A Palpation: Wound Preparation: Ulcer Cleansing: N/A N/A Rinsed/Irrigated with Saline Topical Anesthetic Applied: Other: lidocaine 4% Treatment Notes Electronic Signature(s) Signed: 06/09/2015 4:02:17 PM By: Alejandro MullingPinkerton, Debra Entered By: Alejandro MullingPinkerton, Debra on 06/09/2015 10:25:06 Gina BossWILLIAMS, Dominika H. ([id number]) -------------------------------------------------------------------------------- Multi-Disciplinary Care Plan Details Patient Name: Gina BossWILLIAMS, Reva H. Date of Service: 06/09/2015 9:30 AM Medical Record Number: [id number] Patient Account Number: [id number] Date of Birth/Sex: 09-Jun-1917 (82 y.o. Female) Treating RN: Ashok CordiaPinkerton, Debi Primary Care Physician: Einar CrowAnderson, Marshall Other Clinician: Referring Physician: Wonda ChengMoffett, Joel Treating Physician/Extender: Rudene ReBritto, Errol Weeks in Treatment: 0 Active Inactive Abuse / Safety / Falls / Self Care Management Nursing Diagnoses: Potential for falls Goals: Patient will remain injury free Date Initiated: 06/09/2015 Goal Status: Active Interventions: Assess fall risk on admission and as needed Notes: Nutrition Nursing Diagnoses: Imbalanced nutrition Impaired glucose control: actual or potential Potential for alteratiion in Nutrition/Potential for imbalanced nutrition Goals: Patient/caregiver agrees to and verbalizes understanding of need to use nutritional supplements and/or vitamins as prescribed Date Initiated: 06/09/2015 Goal Status: Active Patient/caregiver verbalizes understanding of need to maintain therapeutic glucose control per primary care physician Date Initiated: 06/09/2015 Goal Status: Active Patient/caregiver will maintain therapeutic glucose control Date Initiated: 06/09/2015 Goal Status: Active Interventions: Assess patient nutrition upon admission and as needed per policy Gina BossWILLIAMS, Mckyla H. ([id number]) Notes: Orientation to the Wound Care Program Nursing Diagnoses: Knowledge deficit related to the  wound healing center program Goals: Patient/caregiver will verbalize understanding of the Wound Healing Center Program Date Initiated: 06/09/2015 Goal Status: Active Interventions: Provide education on orientation to the wound center Notes: Pain, Acute or Chronic Nursing Diagnoses: Pain, acute or chronic: actual or potential Potential alteration in comfort, pain Goals: Patient will verbalize adequate pain control and receive pain control interventions during procedures as needed Date Initiated: 06/09/2015 Goal Status: Active Interventions: Assess comfort goal upon admission Complete pain assessment as per visit requirements Notes: Pressure Nursing Diagnoses: Knowledge deficit related to causes and risk factors for pressure ulcer development Knowledge deficit related to management of pressures ulcers Goals: Patient will remain free from development of additional pressure ulcers Date Initiated: 06/09/2015 Goal Status: Active Interventions: Gina BossWILLIAMS, Reyana H. ([id number]) Assess: immobility, friction, shearing, incontinence upon admission and as needed Assess offloading mechanisms upon admission and as needed Notes: Wound/Skin Impairment Nursing Diagnoses: Impaired tissue integrity Goals: Ulcer/skin breakdown will have a volume reduction of 30% by week 4 Date Initiated: 06/09/2015 Goal Status: Active Ulcer/skin breakdown will have a volume reduction of 50% by week 8 Date Initiated: 06/09/2015 Goal Status: Active Ulcer/skin breakdown will have a volume reduction of 80% by week 12 Date Initiated: 06/09/2015 Goal Status: Active Interventions: Assess ulceration(s) every visit Notes: Electronic Signature(s) Signed: 06/09/2015 4:02:17 PM By: Alejandro MullingPinkerton, Debra Entered By: Alejandro MullingPinkerton, Debra on 06/09/2015 10:24:49 Gina BossWILLIAMS, Raylie H. ([id number]) -------------------------------------------------------------------------------- Pain Assessment Details Patient Name: Gina BossWILLIAMS, Saloma  H. Date of Service: 06/09/2015 9:30 AM Medical Record Number: [id number] Patient Account Number: [id number] Date of Birth/Sex: 09-Jun-1917 (82 y.o. Female) Treating RN: Phillis HaggisPinkerton, Debi Primary Care Physician: Einar CrowAnderson, Marshall  Other Clinician: Referring Physician: Wonda Cheng Treating Physician/Extender: Rudene Re in Treatment: 0 Active Problems Location of Pain Severity and Description of Pain Patient Has Paino Patient Unable to Respond Site Locations Pain Management and Medication Current Pain Management: Notes pt has dementia Electronic Signature(s) Signed: 06/09/2015 4:02:17 PM By: Alejandro Mulling Entered By: Alejandro Mulling on 06/09/2015 09:48:26 Gina [name] ([id number]) -------------------------------------------------------------------------------- Patient/Caregiver Education Details Patient Name: Gina [name] Date of Service: 06/09/2015 9:30 AM Medical Record Number: [id number] Patient Account Number: [id number] Date of Birth/Gender: March 02, 1917 (82 y.o. Female) Treating RN: Ashok Cordia, Debi Primary Care Physician: Einar Crow Other Clinician: Referring Physician: Wonda Cheng Treating Physician/Extender: Rudene Re in Treatment: 0 Education Assessment Education Provided To: Patient Education Topics Provided Wound/Skin Impairment: Handouts: Other: change dressing as ordered Methods: Demonstration, Explain/Verbal Responses: State content correctly Electronic Signature(s) Signed: 06/09/2015 4:02:17 PM By: Alejandro Mulling Entered By: Alejandro Mulling on 06/09/2015 10:39:21 Gina [name] ([id number]) -------------------------------------------------------------------------------- Wound Assessment Details Patient Name: Gina [name] Date of Service: 06/09/2015 9:30 AM Medical Record Number: [id number] Patient Account Number: [id number] Date of Birth/Sex: 04-Aug-1917 (82 y.o. Female) Treating RN: Ashok Cordia,  Debi Primary Care Physician: Einar Crow Other Clinician: Referring Physician: Wonda Cheng Treating Physician/Extender: Rudene Re in Treatment: 0 Wound Status Wound Number: 6 Primary Diabetic Wound/Ulcer of the Lower Etiology: Extremity Wound Location: Right Calcaneus Secondary Pressure Ulcer Wounding Event: Pressure Injury Etiology: Date Acquired: 02/02/2015 Wound Open Weeks Of Treatment: 0 Status: Clustered Wound: No Comorbid Cataracts, Chronic sinus History: problems/congestion, Hypertension, Type II Diabetes, Osteoarthritis, Dementia, Seizure Disorder Photos Photo Uploaded By: Alejandro Mulling on 06/09/2015 16:01:16 Wound Measurements Length: (cm) 2.3 % Reduction in Width: (cm) 2.5 % Reduction in Depth: (cm) 0.3 Epithelializati Area: (cm) 4.516 Tunneling: Volume: (cm) 1.355 Undermining: Area: Volume: on: None No No Wound Description Classification: Grade 1 Wound Margin: Thickened Exudate Amount: Large Exudate Type: Serous Exudate Color: amber Foul Odor After Cleansing: Yes Due to Product Use: No Wound Bed Granulation Amount: None Present (0%) Exposed Structure HADAS, JESSOP ([id number]) Necrotic Amount: Large (67-100%) Fascia Exposed: No Necrotic Quality: Eschar, Adherent Slough Fat Layer Exposed: No Tendon Exposed: No Muscle Exposed: No Joint Exposed: No Bone Exposed: No Limited to Skin Breakdown Periwound Skin Texture Texture Color No Abnormalities Noted: No No Abnormalities Noted: No Localized Edema: Yes Erythema: Yes Erythema Location: Circumferential Moisture No Abnormalities Noted: No Temperature / Pain Moist: Yes Temperature: No Abnormality Tenderness on Palpation: Yes Wound Preparation Ulcer Cleansing: Rinsed/Irrigated with Saline Topical Anesthetic Applied: Other: lidocaine 4%, Treatment Notes Wound #6 (Right Calcaneus) 1. Cleansed with: Cleanse wound with antibacterial soap and water 2.  Anesthetic Topical Lidocaine 4% cream to wound bed prior to debridement 3. Peri-wound Care: Barrier cream 4. Dressing Applied: Santyl Ointment 5. Secondary Dressing Applied Guaze, ABD and kerlix/Conform 7. Secured with Tape Notes netting Electronic Signature(s) Signed: 06/09/2015 4:02:17 PM By: Alejandro Mulling Entered By: Alejandro Mulling on 06/09/2015 10:13:07 Gina [name] ([id number]) -------------------------------------------------------------------------------- Vitals Details Patient Name: Gina [name] Date of Service: 06/09/2015 9:30 AM Medical Record Number: [id number] Patient Account Number: [id number] Date of Birth/Sex: 03-25-1917 (82 y.o. Female) Treating RN: Ashok Cordia, Debi Primary Care Physician: Einar Crow Other Clinician: Referring Physician: Wonda Cheng Treating Physician/Extender: Rudene Re in Treatment: 0 Vital Signs Time Taken: 09:48 Temperature (F): 97.4 Height (in): 63 Pulse (bpm): 83 Source: Stated Respiratory Rate (breaths/min): 16 Weight (lbs): 142 Blood Pressure (mmHg): 137/72 Source: Stated Reference Range: 80 - 120 mg / dl Body Mass Index (BMI): 25.2 Electronic Signature(s)  Signed: 06/09/2015 4:02:17 PM By: Alejandro Mulling Entered By: Alejandro Mulling on 06/09/2015 09:58:38

## 2015-06-10 NOTE — Progress Notes (Signed)
Gina [name], Gina [name] ([id number]) Visit Report for 06/09/2015 Chief Complaint Document Details Patient Name: Gina [name], Gina [name]. Date of Service: 06/09/2015 9:30 AM Medical Record Number: [id number] Patient Account Number: [id number] Date of Birth/Sex: [birth year] (82 y.o. Female) Treating RN: Phillis Haggis Primary Care Physician: Einar Crow Other Clinician: Referring Physician: Wonda Cheng Treating Physician/Extender: Rudene Re in Treatment: 0 Information Obtained from: Patient Chief Complaint Patient is at the clinic for treatment of an open pressure ulcer to the right heel which she's had for about 4 months Electronic Signature(s) Signed: 06/09/2015 10:44:24 AM By: Evlyn Kanner MD, FACS Entered By: Evlyn Kanner on 06/09/2015 10:44:23 Gina [name] ([id number]) -------------------------------------------------------------------------------- Debridement Details Patient Name: Gina [name] Date of Service: 06/09/2015 9:30 AM Medical Record Number: [id number] Patient Account Number: [id number] Date of Birth/Sex: 09-Aug-1917 (82 y.o. Female) Treating RN: Ashok Cordia, Debi Primary Care Physician: Einar Crow Other Clinician: Referring Physician: Wonda Cheng Treating Physician/Extender: Rudene Re in Treatment: 0 Debridement Performed for Wound #6 Right Calcaneus Assessment: Performed By: Physician Evlyn Kanner, MD Debridement: Debridement Pre-procedure Yes Verification/Time Out Taken: Start Time: 10:25 Pain Control: Lidocaine 4% Topical Solution Level: Skin/Subcutaneous Tissue Total Area Debrided (L x 2.3 (cm) x 2.5 (cm) = 5.75 (cm) W): Tissue and other Viable, Non-Viable, Exudate, Fibrin/Slough, Subcutaneous material debrided: Instrument: Forceps, Scissors Bleeding: Minimum Hemostasis Achieved: Pressure End Time: 10:30 Procedural Pain: 0 Post Procedural Pain: 0 Response to Treatment: Procedure was tolerated well Post  Debridement Measurements of Total Wound Length: (cm) 2.3 Width: (cm) 2.5 Depth: (cm) 0.5 Volume: (cm) 2.258 Post Procedure Diagnosis Same as Pre-procedure Electronic Signature(s) Signed: 06/09/2015 10:43:59 AM By: Evlyn Kanner MD, FACS Signed: 06/09/2015 4:02:17 PM By: Alejandro Mulling Entered By: Evlyn Kanner on 06/09/2015 10:43:59 Gina [name] ([id number]) -------------------------------------------------------------------------------- HPI Details Patient Name: Gina [name] Date of Service: 06/09/2015 9:30 AM Medical Record Number: [id number] Patient Account Number: [id number] Date of Birth/Sex: 09-06-1917 (82 y.o. Female) Treating RN: Phillis Haggis Primary Care Physician: Einar Crow Other Clinician: Referring Physician: Wonda Cheng Treating Physician/Extender: Rudene Re in Treatment: 0 History of Present Illness Location: injury to the right heel with drainage Quality: Patient reports No Pain. Severity: Patient states wound are getting worse. Duration: Patient has had the wound for > 4 months prior to seeking treatment at the wound center Timing: Pain in wound is Intermittent (comes and goes Context: The wound appeared gradually over time Modifying Factors: Other treatment(s) tried include:was seen by her PCP and put on Keflex recently but the dose is complete Associated Signs and Symptoms: Patient reports having foul odor. HPI Description: 82 year old patient who is a resident of a nursing home who comes to see as with a decubitus ulcer of the right heel. He has a past medical history of diabetes mellitus, hypercholesterolemia, hypothyroidism, GERD, hypertension, dementia, chronic kidney disease and age-related osteoporosis. She is status post cholecystectomy, hysterectomy, back fusion, debridement of necrotic ulcer right thigh in February 2017. She has never been a smoker. He has been seen at our wound center in the past and was treated in  2015. She was recently seen by her PCP Dr. Einar Crow, for a heel ulcer and he had recommend Keflex and local dressing. Electronic Signature(s) Signed: 06/09/2015 10:45:19 AM By: Evlyn Kanner MD, FACS Previous Signature: 06/09/2015 10:05:03 AM Version By: Evlyn Kanner MD, FACS Entered By: Evlyn Kanner on 06/09/2015 10:45:19 Gina [name] ([id number]) -------------------------------------------------------------------------------- Physical Exam Details Patient Name: Gina [name] Date of Service: 06/09/2015 9:30 AM Medical Record Number: [id number] Patient Account  Number: [id number] Date of Birth/Sex: 05-30-1917 (82 y.o. Female) Treating RN: Phillis Haggis Primary Care Physician: Einar Crow Other Clinician: Referring Physician: Wonda Cheng Treating Physician/Extender: Rudene Re in Treatment: 0 Constitutional . Pulse regular. Respirations normal and unlabored. Afebrile. . Eyes Nonicteric. Reactive to light. Ears, Nose, Mouth, and Throat Lips, teeth, and gums WNL.Marland Kitchen Moist mucosa without lesions. Neck supple and nontender. No palpable supraclavicular or cervical adenopathy. Normal sized without goiter. Respiratory WNL. No retractions.. Cardiovascular Pedal Pulses WNL. right ABI was noncompressible left was 1.09. mild lymphedema of the right lower extremity. Gastrointestinal (GI) Abdomen without masses or tenderness.. No liver or spleen enlargement or tenderness.. Lymphatic No adneopathy. No adenopathy. No adenopathy. Musculoskeletal Adexa without tenderness or enlargement.. Digits and nails w/o clubbing, cyanosis, infection, petechiae, ischemia, or inflammatory conditions.. Integumentary (Hair, Skin) No suspicious lesions. No crepitus or fluctuance. No peri-wound warmth or erythema. No masses.Marland Kitchen Psychiatric Judgement and insight Intact.. No evidence of depression, anxiety, or agitation.. Notes she has a stage III decubitus ulcer of the right  heel with necrotic debris and some purulent material after washing it out nicely with saline. Sharp debridement was done with forcep and scissors and a lot of debris removed but no bone is palpable. There is no evidence of cellulitis surrounding this. Electronic Signature(s) Signed: 06/09/2015 10:46:25 AM By: Evlyn Kanner MD, FACS Entered By: Evlyn Kanner on 06/09/2015 10:46:25 Gina [name] ([id number]) -------------------------------------------------------------------------------- Physician Orders Details Patient Name: Gina [name] Date of Service: 06/09/2015 9:30 AM Medical Record Number: [id number] Patient Account Number: [id number] Date of Birth/Sex: Dec 05, 1917 (82 y.o. Female) Treating RN: Ashok Cordia, Debi Primary Care Physician: Einar Crow Other Clinician: Referring Physician: Wonda Cheng Treating Physician/Extender: Rudene Re in Treatment: 0 Verbal / Phone Orders: Yes Clinician: Ashok Cordia, Debi Read Back and Verified: Yes Diagnosis Coding Wound Cleansing Wound #6 Right Calcaneus o Clean wound with Normal Saline. o Cleanse wound with mild soap and water Anesthetic Wound #6 Right Calcaneus o Topical Lidocaine 4% cream applied to wound bed prior to debridement - for clinic use Skin Barriers/Peri-Wound Care Wound #6 Right Calcaneus o Barrier cream - zinc Primary Wound Dressing Wound #6 Right Calcaneus o Santyl Ointment Secondary Dressing Wound #6 Right Calcaneus o Gauze, ABD and Kerlix/Conform - netting, tape Dressing Change Frequency Wound #6 Right Calcaneus o Change dressing every day. Follow-up Appointments Wound #6 Right Calcaneus o Return Appointment in 1 week. Edema Control Wound #6 Right Calcaneus o Elevate legs to the level of the heart and pump ankles as often as possible Off-Loading Gina [name], Gina [name]. ([id number]) Wound #6 Right Calcaneus o Turn and reposition every 2 hours o Other: - sage  boots float heels Additional Orders / Instructions Wound #6 Right Calcaneus o Increase protein intake. Medications-please add to medication list. Wound #6 Right Calcaneus o Santyl Enzymatic Ointment o Other: - Vitamin C, Vitamin A, Zinc, Multivitamin Laboratory o Bacteria identified in Wound by Culture (MICRO) - right done (this was done in the wound care office) oooo LOINC Code: 6462-6 oooo Convenience Name: Wound culture routine Radiology o X-ray, heel - right heel Electronic Signature(s) Signed: 06/09/2015 3:49:49 PM By: Evlyn Kanner MD, FACS Signed: 06/09/2015 4:02:17 PM By: Alejandro Mulling Entered By: Alejandro Mulling on 06/09/2015 11:52:36 Gina [name], Gina [name] ([id number]) -------------------------------------------------------------------------------- Prescription 06/09/2015 Patient Name: Gina [name]. Physician: Evlyn Kanner MD Date of Birth: 04/30/1917 NPI#: [id number] Sex: F DEA#: [id number] Phone #: [phone] License #: Patient Address: [facility] [address] [place], [place] [address] [facility] [address] [place], [place] [address] [phone] Allergies No Known Drug Allergies Physician's Orders Santyl Enzymatic Ointment Signature(s): Date(s): Electronic Signature(s) Signed: 06/09/2015 3:49:49 PM By: Evlyn Kanner MD, FACS Signed: 06/09/2015 4:02:17 PM By: Alejandro Mulling Entered By: Alejandro Mulling on 06/09/2015 11:52:36 Gina [name] ([id number]) --------------------------------------------------------------------------------  Problem List Details Patient Name: Gina [name] Date of Service: 06/09/2015 9:30 AM Medical Record Number: [id number] Patient Account Number: [id number] Date of Birth/Sex: 09-04-1917 (82 y.o. Female) Treating RN: Ashok Cordia, Debi Primary Care Physician: Einar Crow Other Clinician: Referring Physician: Wonda Cheng Treating Physician/Extender: Rudene Re in Treatment: 0 Active Problems ICD-10 Encounter Code Description Active Date Diagnosis E11.621 Type 2 diabetes mellitus with foot ulcer 06/09/2015 Yes L89.613 Pressure ulcer of right heel, stage 3 06/09/2015 Yes F03.90 Unspecified dementia without behavioral disturbance 06/09/2015 Yes Inactive Problems Resolved Problems Electronic Signature(s) Signed: 06/09/2015 10:43:49 AM By: Evlyn Kanner MD, FACS Entered By: Evlyn Kanner on 06/09/2015 10:43:48 Gina [name] ([id number]) -------------------------------------------------------------------------------- Progress Note Details Patient Name: Gina [name] Date of Service: 06/09/2015 9:30 AM Medical Record Number: [id number] Patient Account Number: [id number] Date of Birth/Sex: [birth year] (82 y.o. Female) Treating RN: Ashok Cordia, Debi Primary Care Physician: Einar Crow Other Clinician: Referring Physician: Wonda Cheng Treating Physician/Extender: Rudene Re in Treatment: 0 Subjective Chief Complaint Information obtained from Patient Patient is at the clinic for treatment of an open pressure ulcer to the right heel which she's had for about 4 months History of Present Illness (HPI) The following HPI elements were documented for the patient's wound: Location: injury to the right heel with drainage Quality: Patient reports No Pain. Severity: Patient states wound are getting worse. Duration: Patient has had the wound for > 4 months prior to seeking treatment at the wound center Timing: Pain in wound is Intermittent (comes and goes Context: The wound appeared gradually over time Modifying Factors: Other treatment(s) tried include:was seen by her PCP and put on Keflex recently but the dose is complete Associated Signs and Symptoms: Patient reports having foul odor. 82 year old patient who is a resident of a nursing home who comes to see as with a decubitus  ulcer of the right heel. He has a past medical history of diabetes mellitus, hypercholesterolemia, hypothyroidism, GERD, hypertension, dementia, chronic kidney disease and age-related osteoporosis. She is status post cholecystectomy, hysterectomy, back fusion, debridement of necrotic ulcer right thigh in February 2017. She has never been a smoker. He has been seen at our wound center in the past and was treated in 2015. She was recently seen by her PCP Dr. Einar Crow, for a heel ulcer and he had recommend Keflex and local dressing. Wound History Patient presents with 1 open wound that has been present for approximately since feb. Patient has been treating wound in the following manner: daughter unsure. Laboratory tests have not been performed in the last month. Patient reportedly has not tested positive for an antibiotic resistant organism. Patient reportedly has not tested positive for osteomyelitis. Patient reportedly has not had testing performed to evaluate circulation in the legs. Patient experiences the following problems associated with their wounds: swelling. Patient History Information obtained from Patient. Allergies Gina [name], Gina [name] ([id number]) No Known Drug Allergies Family History Cancer - Siblings, Diabetes - Siblings, Child, Hypertension - Child, Stroke - Mother, Thyroid Problems - Child, No family history of Heart Disease, Kidney Disease, Lung Disease, Seizures. Social History Never smoker, Marital Status - Widowed, Alcohol Use - Never, Drug Use - No History, Caffeine Use -  Never. Medical History Eyes Patient has history of Cataracts Ear/Nose/Mouth/Throat Patient has history of Chronic sinus problems/congestion Cardiovascular Patient has history of Hypertension Endocrine Patient has history of Type II Diabetes Integumentary (Skin) Denies history of History of pressure wounds Musculoskeletal Patient has history of Osteoarthritis Neurologic Patient has  history of Dementia, Seizure Disorder Patient is treated with Oral Agents. Blood sugar is tested. Blood sugar results noted at the following times: Breakfast - 221, Dinner - 217, Bedtime - 215. Hospitalization/Surgery History - 06/15/2013, [facility], Low Sodium. Medical And Surgical History Notes Constitutional Symptoms (General Health) Diabetic, HTN, Cholesterol, Thyroid, UTIs, Review of Systems (ROS) Constitutional Symptoms (General Health) The patient has no complaints or symptoms. Medications Tylenol 325 mg tablet oral 1 1 tablet oral every four hours as needed for alprazolam 0.25 mg tablet oral 1 1 tablet oral every 12 hours as needed Acidophilus Probiotic 100 million cell-10 mg capsule oral 1 1 capsule oral daily loperamide 2 mg tablet oral 1 1 tablet oral every four hours as needed Glucophage 500 mg tablet oral 1 1 tablet oral two times daily with a meal Gina [name], [name]. ([id number]) atorvastatin 10 mg tablet oral tablet oral daily hydralazine 100 mg tablet oral 1 1 tablet oral every eight hours Prevalite 4 gram oral powder oral powder oral give 4 gm. by mouth every six hours Verelan 240 mg capsule,extended release oral 1 1 capsule,ext rel. pellets 24 hr oral daily Os-Cal 500 + D3 500 mg (1,250 mg)-200 unit tablet oral 1 1 tablet oral two times daily Nasal Spray Bottle miscellaneous two sprays to both nostrils every six hours as needed ferrous sulfate 325 mg (65 mg iron) tablet oral 1 1 tablet oral two times daily Norco 5 mg-325 mg tablet oral 1 1 tablet oral every six hours as needed omeprazole 20 mg tablet,delayed release oral 1 1 tablet,delayed release (DR/EC) oral daily trazodone 50 mg tablet oral one half tablet oral (25 mg.) nightly at bedtime sodium chloride 1 gram tablet oral 1 1 tablet oral two times daily with meals Synthroid 150 mcg tablet oral 1 1 tablet oral daily Lotrimin AF 1 % topical cream topical cream topical apply topically to skin folds two times daily nystatin  100,000 unit/gram topical cream topical cream topical apply to groin as needed Santyl 250 unit/gram topical ointment topical ointment topical appy to right heel topically every day shift and as needed for drainage ascorbic acid (vitamin C) 500 mg tablet oral 1 1 tablet oral daily cholecalciferol (vitamin D3) 50,000 unit tablet oral 1 1 tablet oral on Saturdays Objective Constitutional Pulse regular. Respirations normal and unlabored. Afebrile. Vitals Time Taken: 9:48 AM, Height: 63 in, Source: Stated, Weight: 142 lbs, Source: Stated, BMI: 25.2, Temperature: 97.4 F, Pulse: 83 bpm, Respiratory Rate: 16 breaths/min, Blood Pressure: 137/72 mmHg. Eyes Nonicteric. Reactive to light. Ears, Nose, Mouth, and Throat Lips, teeth, and gums WNL.Marland Kitchen Moist mucosa without lesions. Neck supple and nontender. No palpable supraclavicular or cervical adenopathy. Normal sized without goiter. Respiratory WNL. No retractions.. Cardiovascular Pedal Pulses WNL. right ABI was noncompressible left was 1.09. mild lymphedema of the right lower extremity. Gastrointestinal (GI) Abdomen without masses or tenderness.. No liver or spleen enlargement or tenderness.Mayford Knife, Everardo Beals ([id number]) Lymphatic No adneopathy. No adenopathy. No adenopathy. Musculoskeletal Adexa without tenderness or enlargement.. Digits and nails w/o clubbing, cyanosis, infection, petechiae, ischemia, or inflammatory conditions.Marland Kitchen Psychiatric Judgement and insight Intact.. No evidence of depression, anxiety, or agitation.. General Notes: she has a stage III decubitus ulcer of the right  heel with necrotic debris and some purulent material after washing it out nicely with saline. Sharp debridement was done with forcep and scissors and a lot of debris removed but no bone is palpable. There is no evidence of cellulitis surrounding this. Integumentary (Hair, Skin) No suspicious lesions. No crepitus or fluctuance. No peri-wound warmth or  erythema. No masses.. Wound #6 status is Open. Original cause of wound was Pressure Injury. The wound is located on the Right Calcaneus. The wound measures 2.3cm length x 2.5cm width x 0.3cm depth; 4.516cm^2 area and 1.355cm^3 volume. The wound is limited to skin breakdown. There is no tunneling or undermining noted. There is a large amount of serous drainage noted. The wound margin is thickened. There is no granulation within the wound bed. There is a large (67-100%) amount of necrotic tissue within the wound bed including Eschar and Adherent Slough. The periwound skin appearance exhibited: Localized Edema, Moist, Erythema. The surrounding wound skin color is noted with erythema which is circumferential. Periwound temperature was noted as No Abnormality. The periwound has tenderness on palpation. Assessment Active Problems ICD-10 E11.621 - Type 2 diabetes mellitus with foot ulcer L89.613 - Pressure ulcer of right heel, stage 3 F03.90 - Unspecified dementia without behavioral disturbance 82 year old patient with dementia and diabetes mellitus type 2 which is fairly well-controlled has a pressure injury to the right heel which is a stage III pressure ulcer. She has recently been treated with antibiotics and still has some purulent material and I have cultured this. I have recommended: 1. X-ray of the right foot Gina BossWILLIAMS, [name]. ([id number]) 2. Santyl ointment and packing this wound daily 3. Offloading the heel in every possible way and this has been discussed with the family 4. Protein supplement diet with vitamin A, vitamin C and zinc 5. Regular visits the wound center. due to the noncompressible right ABI I asked the family if they're interested in getting a arterial duplex study done and if needed possible intervention and they declined this at this time.. Her daughter who is the caregiver has been at the bedside and all questions have been answered Procedures Wound #6 Wound #6 is a  Diabetic Wound/Ulcer of the Lower Extremity located on the Right Calcaneus . There was a Skin/Subcutaneous Tissue Debridement ([id number]) debridement with total area of 5.75 sq cm performed by Evlyn KannerBritto, Yazlynn Birkeland, MD. with the following instrument(s): Forceps and Scissors to remove Viable and Non-Viable tissue/material including Exudate, Fibrin/Slough, and Subcutaneous after achieving pain control using Lidocaine 4% Topical Solution. A time out was conducted prior to the start of the procedure. A Minimum amount of bleeding was controlled with Pressure. The procedure was tolerated well with a pain level of 0 throughout and a pain level of 0 following the procedure. Post Debridement Measurements: 2.3cm length x 2.5cm width x 0.5cm depth; 2.258cm^3 volume. Post procedure Diagnosis Wound #6: Same as Pre-Procedure Plan Wound Cleansing: Wound #6 Right Calcaneus: Clean wound with Normal Saline. Cleanse wound with mild soap and water Anesthetic: Wound #6 Right Calcaneus: Topical Lidocaine 4% cream applied to wound bed prior to debridement - for clinic use Skin Barriers/Peri-Wound Care: Wound #6 Right Calcaneus: Barrier cream - zinc Primary Wound Dressing: Wound #6 Right Calcaneus: Santyl Ointment Secondary Dressing: Wound #6 Right Calcaneus: Gauze, ABD and Kerlix/Conform - netting, tape Dressing Change Frequency: Wound #6 Right Calcaneus: Change dressing every day. Follow-up Appointments: Gina BossWILLIAMS, [name]. ([id number]) Wound #6 Right Calcaneus: Return Appointment in 1 week. Edema Control: Wound #6 Right Calcaneus: Elevate legs to the  level of the heart and pump ankles as often as possible Off-Loading: Wound #6 Right Calcaneus: Turn and reposition every 2 hours Other: - sage boots float heels Additional Orders / Instructions: Wound #6 Right Calcaneus: Increase protein intake. Medications-please add to medication list.: Wound #6 Right Calcaneus: Santyl Enzymatic Ointment Other: -  Vitamin C, Vitamin A, Zinc, Multivitamin Radiology ordered were: X-ray, heel - right heel Laboratory ordered were: Wound culture routine - right done (this was done in the wound care office) 82 year old patient with dementia and diabetes mellitus type 2 which is fairly well-controlled has a pressure injury to the right heel which is a stage III pressure ulcer. She has recently been treated with antibiotics and still has some purulent material and I have cultured this. I have recommended: 1. X-ray of the right foot 2. Santyl ointment and packing this wound daily 3. Offloading the heel in every possible way and this has been discussed with the family 4. Protein supplement diet with vitamin A, vitamin C and zinc 5. Regular visits the wound center. due to the noncompressible right ABI I asked the family if they're interested in getting a arterial duplex study done and if needed possible intervention and they declined this at this time.. Her daughter who is the caregiver has been at the bedside and all questions have been answered Electronic Signature(s) Signed: 06/09/2015 3:45:16 PM By: Evlyn Kanner MD, FACS Previous Signature: 06/09/2015 3:45:00 PM Version By: Evlyn Kanner MD, FACS Previous Signature: 06/09/2015 10:49:22 AM Version By: Evlyn Kanner MD, FACS Entered By: Evlyn Kanner on 06/09/2015 15:45:16 Gina [name] ([id number]) -------------------------------------------------------------------------------- ROS/PFSH Details Patient Name: Gina [name] Date of Service: 06/09/2015 9:30 AM Medical Record Number: [id number] Patient Account Number: [id number] Date of Birth/Sex: [birth year] (82 y.o. Female) Treating RN: Ashok Cordia, Debi Primary Care Physician: Einar Crow Other Clinician: Referring Physician: Wonda Cheng Treating Physician/Extender: Rudene Re in Treatment: 0 Label Progress Note Print Version as History and Physical for this encounter Information  Obtained From Patient Wound History Do you currently have one or more open woundso Yes How many open wounds do you currently haveo 1 Approximately how long have you had your woundso since feb How have you been treating your wound(s) until nowo daughter unsure Has your wound(s) ever healed and then re-openedo No Have you had any lab work done in the past montho No Have you tested positive for an antibiotic resistant organism (MRSA, VRE)o No Have you tested positive for osteomyelitis (bone infection)o No Have you had any tests for circulation on your legso No Have you had other problems associated with your woundso Swelling Constitutional Symptoms (General Health) Complaints and Symptoms: No Complaints or Symptoms Medical History: Past Medical History Notes: Diabetic, HTN, Cholesterol, Thyroid, UTIs, Eyes Medical History: Positive for: Cataracts Ear/Nose/Mouth/Throat Medical History: Positive for: Chronic sinus problems/congestion Cardiovascular Medical History: Positive for: Hypertension Endocrine Gina [name], Gina [name] ([id number]) Medical History: Positive for: Type II Diabetes Time with diabetes: 20 years Treated with: Oral agents Blood sugar tested every day: Yes Tested : Blood sugar testing results: Breakfast: 221; Dinner: 217; Bedtime: 215 Integumentary (Skin) Medical History: Negative for: History of pressure wounds Musculoskeletal Medical History: Positive for: Osteoarthritis Neurologic Medical History: Positive for: Dementia; Seizure Disorder HBO Extended History Items Eyes: Ear/Nose/Mouth/Throat: Cataracts Chronic sinus problems/congestion Immunizations Tetanus Vaccine: Last tetanus shot: 04/01/2013 Hospitalization / Surgery History Name of Hospital Purpose of Hospitalization/Surgery Date [facility] Low Sodium 06/15/2013 Family and Social History Cancer: Yes - Siblings; Diabetes: Yes - Siblings, Child; Heart Disease:  No; Hypertension: Yes - Child; Kidney  Disease: No; Lung Disease: No; Seizures: No; Stroke: Yes - Mother; Thyroid Problems: Yes - Child; Never smoker; Marital Status - Widowed; Alcohol Use: Never; Drug Use: No History; Caffeine Use: Never; Living Will: Yes (Not Provided); Medical Power of Attorney: Yes (Not Provided) Physician Affirmation I have reviewed and agree with the above information. Electronic Signature(s) Signed: 06/09/2015 9:53:24 AM By: Evlyn Kanner MD, FACS Signed: 06/09/2015 4:02:17 PM By: Alejandro Mulling Entered By: Evlyn Kanner on 06/09/2015 09:53:23 Gina [name], Gina [name] ([id number], Gina [name] ([id number]) -------------------------------------------------------------------------------- SuperBill Details Patient Name: Gina [name] Date of Service: 06/09/2015 Medical Record Number: [id number] Patient Account Number: [id number] Date of Birth/Sex: 26-Mar-1917 (82 y.o. Female) Treating RN: Ashok Cordia, Debi Primary Care Physician: Einar Crow Other Clinician: Referring Physician: Wonda Cheng Treating Physician/Extender: Rudene Re in Treatment: 0 Diagnosis Coding ICD-10 Codes Code Description E11.621 Type 2 diabetes mellitus with foot ulcer L89.613 Pressure ulcer of right heel, stage 3 F03.90 Unspecified dementia without behavioral disturbance Facility Procedures CPT4 Code: 08657846 Description: 99214 - WOUND CARE VISIT-LEV 4 EST PT Modifier: Quantity: 1 CPT4 Code: 96295284 Description: 11042 - DEB SUBQ TISSUE 20 SQ CM/< ICD-10 Description Diagnosis E11.621 Type 2 diabetes mellitus with foot ulcer L89.613 Pressure ulcer of right heel, stage 3 F03.90 Unspecified dementia without behavioral disturba Modifier: nce Quantity: 1 Physician Procedures CPT4 Code: 1324401 Description: 99204 - WC PHYS LEVEL 4 - NEW PT ICD-10 Description Diagnosis E11.621 Type 2 diabetes mellitus with foot ulcer L89.613 Pressure ulcer of right heel, stage 3 F03.90 Unspecified dementia without behavioral  disturba Modifier: 25 nce Quantity: 1 CPT4 Code: [id number] Tildon Husky Description: 11042 - WC PHYS SUBQ TISS 20 SQ CM ICD-10 Description Diagnosis E11.621 Type 2 diabetes mellitus with foot ulcer L89.613 Pressure ulcer of right heel, stage 3 F03.90 Unspecified dementia without behavioral disturba [facility]. ([id number]) Modifier: nce Quantity: 1 Electronic Signature(s) Signed: 06/09/2015 3:49:49 PM By: Evlyn Kanner MD, FACS Signed: 06/09/2015 4:02:17 PM By: Alejandro Mulling Previous Signature: 06/09/2015 10:49:46 AM Version By: Evlyn Kanner MD, FACS Entered By: Alejandro Mulling on 06/09/2015 11:51:59

## 2015-06-10 NOTE — Progress Notes (Signed)
DARNELLE, DERRICK ([id number]) Visit Report for 06/09/2015 Abuse/Suicide Risk Screen Details Patient Name: Gina [name], Gina [name]. Date of Service: 06/09/2015 9:30 AM Medical Record Number: [id number] Patient Account Number: [id number] Date of Birth/Sex: May 09, 1917 (82 y.o. Female) Treating RN: Phillis Haggis Primary Care Physician: Einar Crow Other Clinician: Referring Physician: Wonda Cheng Treating Physician/Extender: Rudene Re in Treatment: 0 Abuse/Suicide Risk Screen Items Answer ABUSE/SUICIDE RISK SCREEN: Has anyone close to you tried to hurt or harm you recentlyo No Do you feel uncomfortable with anyone in your familyo No Has anyone forced you do things that you didnot want to doo No Do you have any thoughts of harming yourselfo No Patient displays signs or symptoms of abuse and/or neglect. No Electronic Signature(s) Signed: 06/09/2015 4:02:17 PM By: Alejandro Mulling Entered By: Alejandro Mulling on 06/09/2015 09:51:23 Gina [name] ([id number]) -------------------------------------------------------------------------------- Activities of Daily Living Details Patient Name: Gina [name], Gina [name] Date of Service: 06/09/2015 9:30 AM Medical Record Number: [id number] Patient Account Number: [id number] Date of Birth/Sex: July 17, 1917 (82 y.o. Female) Treating RN: Phillis Haggis Primary Care Physician: Einar Crow Other Clinician: Referring Physician: Wonda Cheng Treating Physician/Extender: Rudene Re in Treatment: 0 Activities of Daily Living Items Answer Activities of Daily Living (Please select one for each item) Drive Automobile Not Able Take Medications Not Able Use Telephone Not Able Care for Appearance Not Able Use Toilet Not Able Mady Haagensen / Shower Not Able Dress Self Not Able Feed Self Need Assistance Walk Not Able Get In / Out Bed Not Able Housework Not Able Prepare Meals Not Able Handle Money Not Able Shop for Self Not  Able Electronic Signature(s) Signed: 06/09/2015 4:02:17 PM By: Alejandro Mulling Entered By: Alejandro Mulling on 06/09/2015 09:51:49 Gina [name] ([id number]) -------------------------------------------------------------------------------- Education Assessment Details Patient Name: Gina [name] Date of Service: 06/09/2015 9:30 AM Medical Record Number: [id number] Patient Account Number: [id number] Date of Birth/Sex: [birth year] (82 y.o. Female) Treating RN: Ashok Cordia, Debi Primary Care Physician: Einar Crow Other Clinician: Referring Physician: Wonda Cheng Treating Physician/Extender: Rudene Re in Treatment: 0 Primary Learner Assessed: Caregiver Reason Patient is not Primary Learner: daughter Learning Preferences/Education Level/Primary Language Learning Preference: Explanation, Printed Material Highest Education Level: High School Preferred Language: English Cognitive Barrier Assessment/Beliefs Language Barrier: No Translator Needed: No Memory Deficit: No Emotional Barrier: No Cultural/Religious Beliefs Affecting Medical No Care: Physical Barrier Assessment Impaired Vision: No Impaired Hearing: No Decreased Hand dexterity: No Knowledge/Comprehension Assessment Knowledge Level: High Comprehension Level: High Ability to understand written High instructions: Ability to understand verbal High instructions: Motivation Assessment Anxiety Level: Calm Cooperation: Cooperative Education Importance: Acknowledges Need Interest in Health Problems: Asks Questions Perception: Coherent Willingness to Engage in Self- High Management Activities: Readiness to Engage in Self- High Management Activities: Gina [name], Gina [name] ([id number]) Electronic Signature(s) Signed: 06/09/2015 4:02:17 PM By: Alejandro Mulling Entered By: Alejandro Mulling on 06/09/2015 09:52:29 Gina [name], Gina [name]  ([id number]) -------------------------------------------------------------------------------- Fall Risk Assessment Details Patient Name: Gina [name] Date of Service: 06/09/2015 9:30 AM Medical Record Number: [id number] Patient Account Number: [id number] Date of Birth/Sex: November 03, 1917 (82 y.o. Female) Treating RN: Ashok Cordia, Debi Primary Care Physician: Einar Crow Other Clinician: Referring Physician: Wonda Cheng Treating Physician/Extender: Rudene Re in Treatment: 0 Fall Risk Assessment Items Have you had 2 or more falls in the last 12 monthso 0 No Have you had any fall that resulted in injury in the last 12 monthso 0 No FALL RISK ASSESSMENT: History of falling - immediate or within 3 months 0 No Secondary diagnosis 15 Yes Ambulatory aid None/bed  rest/wheelchair/nurse 0 Yes Crutches/cane/walker 0 No Furniture 0 No IV Access/Saline Lock 0 No Gait/Training Normal/bed rest/immobile 0 No Weak 10 Yes Impaired 20 Yes Mental Status Oriented to own ability 0 No Electronic Signature(s) Signed: 06/09/2015 4:02:17 PM By: Alejandro MullingPinkerton, Debra Entered By: Alejandro MullingPinkerton, Debra on 06/09/2015 09:53:16 Gina BossWILLIAMS, [name]. ([id number]) -------------------------------------------------------------------------------- Foot Assessment Details Patient Name: Gina BossWILLIAMS, [name]. Date of Service: 06/09/2015 9:30 AM Medical Record Number: [id number] Patient Account Number: [id number] Date of Birth/Sex: [birth year] (82 y.o. Female) Treating RN: Ashok CordiaPinkerton, Debi Primary Care Physician: Einar CrowAnderson, Marshall Other Clinician: Referring Physician: Wonda ChengMoffett, Joel Treating Physician/Extender: Rudene ReBritto, Errol Weeks in Treatment: 0 Foot Assessment Items Site Locations + = Sensation present, - = Sensation absent, C = Callus, U = Ulcer R = Redness, W = Warmth, M = Maceration, PU = Pre-ulcerative lesion F = Fissure, S = Swelling, D = Dryness Assessment Right: Left: Other Deformity: No No Prior  Foot Ulcer: No No Prior Amputation: No No Charcot Joint: No No Ambulatory Status: Gait: Notes unable to perform, pt has dementia Electronic Signature(s) Signed: 06/09/2015 4:02:17 PM By: Alejandro MullingPinkerton, Debra Entered By: Alejandro MullingPinkerton, Debra on 06/09/2015 09:56:30 Gina BossWILLIAMS, [name]. ([id number] Knife) [place]on, Everardo BealsLUCILLE H. ([id number]) -------------------------------------------------------------------------------- Nutrition Risk Assessment Details Patient Name: Gina BossWILLIAMS, [name]. Date of Service: 06/09/2015 9:30 AM Medical Record Number: [id number] Patient Account Number: [id number] Date of Birth/Sex: [birth year] (82 y.o. Female) Treating RN: Ashok CordiaPinkerton, Debi Primary Care Physician: Einar CrowAnderson, Marshall Other Clinician: Referring Physician: Wonda ChengMoffett, Joel Treating Physician/Extender: Rudene ReBritto, Errol Weeks in Treatment: 0 Height (in): 63 Weight (lbs): 142 Body Mass Index (BMI): 25.2 Nutrition Risk Assessment Items NUTRITION RISK SCREEN: I have an illness or condition that made me change the kind and/or 2 Yes amount of food I eat I eat fewer than two meals per day 0 No I eat few fruits and vegetables, or milk products 0 No I have three or more drinks of beer, liquor or wine almost every day 0 No I have tooth or mouth problems that make it hard for me to eat 0 No I don't always have enough money to buy the food I need 0 No I eat alone most of the time 0 No I take three or more different prescribed or over-the-counter drugs a 1 Yes day Without wanting to, I have lost or gained 10 pounds in the last six 2 Yes months I am not always physically able to shop, cook and/or feed myself 0 No Nutrition Protocols Good Risk Protocol Moderate Risk Protocol Electronic Signature(s) Signed: 06/09/2015 4:02:17 PM By: Alejandro MullingPinkerton, Debra Entered By: Alejandro MullingPinkerton, Debra on 06/09/2015 09:53:49

## 2015-06-14 LAB — AEROBIC CULTURE W GRAM STAIN (SUPERFICIAL SPECIMEN)

## 2015-06-14 LAB — AEROBIC CULTURE  (SUPERFICIAL SPECIMEN)

## 2015-06-17 ENCOUNTER — Encounter: Payer: PPO | Admitting: Surgery

## 2015-06-17 DIAGNOSIS — L89612 Pressure ulcer of right heel, stage 2: Secondary | ICD-10-CM | POA: Diagnosis not present

## 2015-06-17 DIAGNOSIS — F0390 Unspecified dementia without behavioral disturbance: Secondary | ICD-10-CM | POA: Diagnosis not present

## 2015-06-17 DIAGNOSIS — E11621 Type 2 diabetes mellitus with foot ulcer: Secondary | ICD-10-CM | POA: Diagnosis not present

## 2015-06-17 NOTE — Progress Notes (Addendum)
Gina [name], Gina [name] ([id number]) Visit Report for 06/17/2015 Chief Complaint Document Details Patient Name: Gina [name], Gina [name] 06/17/2015 8:00 Date of Service: AM Medical Record [id number] Number: Patient Account Number: [id number] [birth year] (82 y.o. Treating RN: Phillis Haggis Date of Birth/Sex: Female) Other Clinician: Primary Care Physician: Einar Crow Treating Evlyn Kanner Referring Physician: Einar Crow Physician/Extender: Tania Ade in Treatment: 1 Information Obtained from: Patient Chief Complaint Patient is at the clinic for treatment of an open pressure ulcer to the right heel which she's had for about 4 months Electronic Signature(s) Signed: 06/17/2015 8:30:36 AM By: Evlyn Kanner MD, FACS Entered By: Evlyn Kanner on 06/17/2015 08:30:36 Gina [name] ([id number]) -------------------------------------------------------------------------------- Debridement Details Patient Name: Gina [name] 06/17/2015 8:00 Date of Service: AM Medical Record [id number] Number: Patient Account Number: [id number] 1917/02/07 (82 y.o. Treating RN: Phillis Haggis Date of Birth/Sex: Female) Other Clinician: Primary Care Physician: Einar Crow Treating Evlyn Kanner Referring Physician: Einar Crow Physician/Extender: Tania Ade in Treatment: 1 Debridement Performed for Wound #6 Right Calcaneus Assessment: Performed By: Physician Evlyn Kanner, MD Debridement: Debridement Pre-procedure Yes Verification/Time Out Taken: Start Time: 08:23 Pain Control: Lidocaine 4% Topical Solution Level: Skin/Subcutaneous Tissue Total Area Debrided (L x 1.8 (cm) x 3 (cm) = 5.4 (cm) W): Tissue and other Viable, Non-Viable, Exudate, Fibrin/Slough, Subcutaneous material debrided: Instrument: Forceps, Scissors Bleeding: Minimum Hemostasis Achieved: Pressure End Time: 08:26 Procedural Pain: 0 Post Procedural Pain: 0 Response to Treatment: Procedure was  tolerated well Post Debridement Measurements of Total Wound Length: (cm) 1.8 Width: (cm) 3 Depth: (cm) 1.3 Volume: (cm) 5.513 Post Procedure Diagnosis Same as Pre-procedure Electronic Signature(s) Signed: 06/17/2015 4:58:26 PM By: Evlyn Kanner MD, FACS Signed: 06/17/2015 5:42:36 PM By: Alejandro Mulling Entered By: Alejandro Mulling on 06/17/2015 08:26:53 Gina [name] ([id number]) -------------------------------------------------------------------------------- HPI Details Patient Name: Gina [name] 06/17/2015 8:00 Date of Service: AM Medical Record [id number] Number: Patient Account Number: [id number] 30-Nov-1917 (82 y.o. Treating RN: Phillis Haggis Date of Birth/Sex: Female) Other Clinician: Primary Care Physician: Einar Crow Treating Evlyn Kanner Referring Physician: Einar Crow Physician/Extender: Tania Ade in Treatment: 1 History of Present Illness Location: injury to the right heel with drainage Quality: Patient reports No Pain. Severity: Patient states wound are getting worse. Duration: Patient has had the wound for > 4 months prior to seeking treatment at the wound center Timing: Pain in wound is Intermittent (comes and goes Context: The wound appeared gradually over time Modifying Factors: Other treatment(s) tried include:was seen by her PCP and put on Keflex recently but the dose is complete Associated Signs and Symptoms: Patient reports having foul odor. HPI Description: 82 year old patient who is a resident of a nursing home who comes to see as with a decubitus ulcer of the right heel. He has a past medical history of diabetes mellitus, hypercholesterolemia, hypothyroidism, GERD, hypertension, dementia, chronic kidney disease and age-related osteoporosis. She is status post cholecystectomy, hysterectomy, back fusion, debridement of necrotic ulcer right thigh in February 2017. She has never been a smoker. He has been seen at our wound  center in the past and was treated in 2015. She was recently seen by her PCP Dr. Einar Crow, for a heel ulcer and he had recommend Keflex and local dressing. 06/17/2015 -- grew Escherichia coli which was treated by Dr. Leanord Hawking with Keflex 500 mg by mouth 3 times a day for 7 days. Culture results were noted -- wound culture grew Escherichia coli sensitive to Bactrim, ampicillin, ampicillin sulbactam, Kefzol and, cefepime. Electronic Signature(s) Signed: 06/17/2015 8:30:47 AM By: Evlyn Kanner MD, FACS  Previous Signature: 06/17/2015 8:17:07 AM Version By: Evlyn Kanner MD, FACS Previous Signature: 06/17/2015 8:16:51 AM Version By: Evlyn Kanner MD, FACS Entered By: Evlyn Kanner on 06/17/2015 08:30:46 Gina [name] ([id number]) -------------------------------------------------------------------------------- Physical Exam Details Patient Name: Gina [name], Gina [name] 06/17/2015 8:00 Date of Service: AM Medical Record [id number] Number: Patient Account Number: [id number] [birth year] (82 y.o. Treating RN: Phillis Haggis Date of Birth/Sex: Female) Other Clinician: Primary Care Physician: Einar Crow Treating Evlyn Kanner Referring Physician: Einar Crow Physician/Extender: Tania Ade in Treatment: 1 Constitutional . Pulse regular. Respirations normal and unlabored. Afebrile. . Eyes Nonicteric. Reactive to light. Ears, Nose, Mouth, and Throat Lips, teeth, and gums WNL.Marland Kitchen Moist mucosa without lesions. Neck supple and nontender. No palpable supraclavicular or cervical adenopathy. Normal sized without goiter. Respiratory WNL. No retractions.. Cardiovascular Pedal Pulses WNL. No clubbing, cyanosis or edema. Gastrointestinal (GI) Abdomen without masses or tenderness.. No liver or spleen enlargement or tenderness.. Genitourinary (GU) No hydrocele, spermatocele, tenderness of the cord, or testicular mass.Marland Kitchen Penis without lesions.Renetta Chalk without lesions. No cystocele, or  rectocele. Pelvic support intact, no discharge.Marland Kitchen Urethra without masses, tenderness or scarring.Marland Kitchen Lymphatic No adneopathy. No adenopathy. No adenopathy. Musculoskeletal Adexa without tenderness or enlargement.. Digits and nails w/o clubbing, cyanosis, infection, petechiae, ischemia, or inflammatory conditions.. Integumentary (Hair, Skin) No suspicious lesions. No crepitus or fluctuance. No peri-wound warmth or erythema. No masses.Marland Kitchen Psychiatric Judgement and insight Intact.. No evidence of depression, anxiety, or agitation.. Notes the right heel has a stage III decubitus ulcer and I have done sharp dissection with the forceps and scissors and removed a lot of subcutaneous debris. Bone is noted palpable. There is no evidence of cellulitis of the right foot. Electronic Signature(s) WILHELMINA, HARK ([id number]) Signed: 06/17/2015 8:31:33 AM By: Evlyn Kanner MD, FACS Entered By: Evlyn Kanner on 06/17/2015 08:31:33 MARLIN, BRYS ([id number]) -------------------------------------------------------------------------------- Physician Orders Details Patient Name: Gina [name], Gina [name] 06/17/2015 8:00 Date of Service: AM Medical Record [id number] Number: Patient Account Number: [id number] [birth year] (82 y.o. Treating RN: Phillis Haggis Date of Birth/Sex: Female) Other Clinician: Primary Care Physician: Einar Crow Treating Evlyn Kanner Referring Physician: Einar Crow Physician/Extender: Tania Ade in Treatment: 1 Verbal / Phone Orders: No Diagnosis Coding Wound Cleansing Wound #6 Right Calcaneus o Clean wound with Normal Saline. o Cleanse wound with mild soap and water Anesthetic Wound #6 Right Calcaneus o Topical Lidocaine 4% cream applied to wound bed prior to debridement - for clinic use Skin Barriers/Peri-Wound Care Wound #6 Right Calcaneus o Barrier cream - zinc Primary Wound Dressing Wound #6 Right Calcaneus o Santyl Ointment Secondary  Dressing Wound #6 Right Calcaneus o Gauze, ABD and Kerlix/Conform - netting, tape o Foam Dressing Change Frequency Wound #6 Right Calcaneus o Change dressing every day. Follow-up Appointments Wound #6 Right Calcaneus o Return Appointment in 1 week. Edema Control Wound #6 Right Calcaneus o Elevate legs to the level of the heart and pump ankles as often as possible NHYIRA, LEANO. ([id number]) Off-Loading Wound #6 Right Calcaneus o Turn and reposition every 2 hours o Other: - sage boots float heels Additional Orders / Instructions Wound #6 Right Calcaneus o Increase protein intake. Medications-please add to medication list. Wound #6 Right Calcaneus o P.O. Antibiotics - continue oral antibiotics as prescribed o Santyl Enzymatic Ointment o Other: - Vitamin C, Vitamin A, Zinc, Multivitamin Radiology o X-ray, heel - right Electronic Signature(s) Signed: 06/17/2015 4:58:26 PM By: Evlyn Kanner MD, FACS Signed: 06/17/2015 5:42:36 PM By: Alejandro Mulling Entered By: Alejandro Mulling on 06/17/2015 08:28:29 Gina [name] ([id number]) --------------------------------------------------------------------------------  Prescription 06/17/2015 Patient Name: Gina [name], Gina [name]. Physician: Evlyn KannerBritto, Ukiah Trawick MD Date of Birth: 31-Jan-1917 NPI#: [id number] Sex: F DEA#: [id number] Phone #: [phone] License #: Patient Address: [facility] [address] [place], [place] [address] [facility] [address], [address] [place], [place] [address] [phone] Allergies No Known Drug Allergies Physician's Orders Santyl Enzymatic Ointment Signature(s): Date(s): Electronic Signature(s) Signed: 06/17/2015 4:58:26 PM By: Evlyn KannerBritto, Darean Rote MD, FACS Signed: 06/17/2015 5:42:36 PM By: Alejandro MullingPinkerton, Debra Entered By: Alejandro MullingPinkerton, Debra on 06/17/2015 08:28:29 Gina [name], Gina [name].  ([id number]) --------------------------------------------------------------------------------  Problem List Details Patient Name: Gina [name], Gina [name]. 06/17/2015 8:00 Date of Service: AM Medical Record [id number] Number: Patient Account Number: [id number] 31-Jan-1917 (82 y.o. Treating RN: Phillis HaggisPinkerton, Debi Date of Birth/Sex: Female) Other Clinician: Primary Care Physician: Einar CrowAnderson, Marshall Treating Evlyn KannerBritto, Alie Moudy Referring Physician: Einar CrowAnderson, Marshall Physician/Extender: Tania AdeWeeks in Treatment: 1 Active Problems ICD-10 Encounter Code Description Active Date Diagnosis E11.621 Type 2 diabetes mellitus with foot ulcer 06/09/2015 Yes L89.613 Pressure ulcer of right heel, stage 3 06/09/2015 Yes F03.90 Unspecified dementia without behavioral disturbance 06/09/2015 Yes Inactive Problems Resolved Problems Electronic Signature(s) Signed: 06/17/2015 8:30:27 AM By: Evlyn KannerBritto, Loann Chahal MD, FACS Entered By: Evlyn KannerBritto, Davisha Linthicum on 06/17/2015 08:30:27 Gina [name], Gina [name]. ([id number]) -------------------------------------------------------------------------------- Progress Note Details Patient Name: Gina [name], Gina [name]. 06/17/2015 8:00 Date of Service: AM Medical Record [id number] Number: Patient Account Number: [id number] 31-Jan-1917 (82 y.o. Treating RN: Phillis HaggisPinkerton, Debi Date of Birth/Sex: Female) Other Clinician: Primary Care Physician: Einar CrowAnderson, Marshall Treating Evlyn KannerBritto, Elisa Kutner Referring Physician: Einar CrowAnderson, Marshall Physician/Extender: Tania AdeWeeks in Treatment: 1 Subjective Chief Complaint Information obtained from Patient Patient is at the clinic for treatment of an open pressure ulcer to the right heel which she's had for about 4 months History of Present Illness (HPI) The following HPI elements were documented for the patient's wound: Location: injury to the right heel with drainage Quality: Patient reports No Pain. Severity: Patient states wound are getting worse. Duration: Patient has had the wound for  > 4 months prior to seeking treatment at the wound center Timing: Pain in wound is Intermittent (comes and goes Context: The wound appeared gradually over time Modifying Factors: Other treatment(s) tried include:was seen by her PCP and put on Keflex recently but the dose is complete Associated Signs and Symptoms: Patient reports having foul odor. 82 year old patient who is a resident of a nursing home who comes to see as with a decubitus ulcer of the right heel. He has a past medical history of diabetes mellitus, hypercholesterolemia, hypothyroidism, GERD, hypertension, dementia, chronic kidney disease and age-related osteoporosis. She is status post cholecystectomy, hysterectomy, back fusion, debridement of necrotic ulcer right thigh in February 2017. She has never been a smoker. He has been seen at our wound center in the past and was treated in 2015. She was recently seen by her PCP Dr. Einar CrowMarshall Anderson, for a heel ulcer and he had recommend Keflex and local dressing. 06/17/2015 -- grew Escherichia coli which was treated by Dr. Leanord Hawkingobson with Keflex 500 mg by mouth 3 times a day for 7 days. Culture results were noted -- wound culture grew Escherichia coli sensitive to Bactrim, ampicillin, ampicillin sulbactam, Kefzol and, cefepime. Objective Gina [name], Gina [name]. ([id number]) Constitutional Pulse regular. Respirations normal and unlabored. Afebrile. Vitals Time Taken: 8:05 AM, Height: 63 in, Weight: 142 lbs, BMI: 25.2, Temperature: 98.0 F, Pulse: 88 bpm, Respiratory Rate: 16 breaths/min, Blood Pressure: 147/77 mmHg. Eyes Nonicteric. Reactive to light. Ears, Nose, Mouth, and Throat Lips, teeth, and gums WNL.Marland Kitchen. Moist mucosa without  lesions. Neck supple and nontender. No palpable supraclavicular or cervical adenopathy. Normal sized without goiter. Respiratory WNL. No retractions.. Cardiovascular Pedal Pulses WNL. No clubbing, cyanosis or edema. Gastrointestinal (GI) Abdomen without  masses or tenderness.. No liver or spleen enlargement or tenderness.. Genitourinary (GU) No hydrocele, spermatocele, tenderness of the cord, or testicular mass.Marland Kitchen Penis without lesions.Renetta Chalk without lesions. No cystocele, or rectocele. Pelvic support intact, no discharge.Marland Kitchen Urethra without masses, tenderness or scarring.Marland Kitchen Lymphatic No adneopathy. No adenopathy. No adenopathy. Musculoskeletal Adexa without tenderness or enlargement.. Digits and nails w/o clubbing, cyanosis, infection, petechiae, ischemia, or inflammatory conditions.Marland Kitchen Psychiatric Judgement and insight Intact.. No evidence of depression, anxiety, or agitation.. General Notes: the right heel has a stage III decubitus ulcer and I have done sharp dissection with the forceps and scissors and removed a lot of subcutaneous debris. Bone is noted palpable. There is no evidence of cellulitis of the right foot. Integumentary (Hair, Skin) No suspicious lesions. No crepitus or fluctuance. No peri-wound warmth or erythema. No masses.. Wound #6 status is Open. Original cause of wound was Pressure Injury. The wound is located on the Right Calcaneus. The wound measures 1.8cm length x 3cm width x 1.2cm depth; 4.241cm^2 area and [place], Westlynn [name]. ([id number]) 5.089cm^3 volume. The wound is limited to skin breakdown. There is no tunneling or undermining noted. There is a large amount of purulent drainage noted. The wound margin is thickened. There is no granulation within the wound bed. There is a large (67-100%) amount of necrotic tissue within the wound bed including Adherent Slough. The periwound skin appearance exhibited: Localized Edema, Moist, Erythema. The surrounding wound skin color is noted with erythema which is circumferential. Periwound temperature was noted as No Abnormality. The periwound has tenderness on palpation. Assessment Active Problems ICD-10 E11.621 - Type 2 diabetes mellitus with foot ulcer L89.613 - Pressure  ulcer of right heel, stage 3 F03.90 - Unspecified dementia without behavioral disturbance Procedures Wound #6 Wound #6 is a Diabetic Wound/Ulcer of the Lower Extremity located on the Right Calcaneus . There was a Skin/Subcutaneous Tissue Debridement ([id number]) debridement with total area of 5.4 sq cm performed by Evlyn Kanner, MD. with the following instrument(s): Forceps and Scissors to remove Viable and Non- Viable tissue/material including Exudate, Fibrin/Slough, and Subcutaneous after achieving pain control using Lidocaine 4% Topical Solution. A time out was conducted prior to the start of the procedure. A Minimum amount of bleeding was controlled with Pressure. The procedure was tolerated well with a pain level of 0 throughout and a pain level of 0 following the procedure. Post Debridement Measurements: 1.8cm length x 3cm width x 1.3cm depth; 5.513cm^3 volume. Post procedure Diagnosis Wound #6: Same as Pre-Procedure Plan Wound Cleansing: Wound #6 Right Calcaneus: Clean wound with Normal Saline. Cleanse wound with mild soap and water Anesthetic: DEUNDRA, FURBER ([id number]) Wound #6 Right Calcaneus: Topical Lidocaine 4% cream applied to wound bed prior to debridement - for clinic use Skin Barriers/Peri-Wound Care: Wound #6 Right Calcaneus: Barrier cream - zinc Primary Wound Dressing: Wound #6 Right Calcaneus: Santyl Ointment Secondary Dressing: Wound #6 Right Calcaneus: Gauze, ABD and Kerlix/Conform - netting, tape Foam Dressing Change Frequency: Wound #6 Right Calcaneus: Change dressing every day. Follow-up Appointments: Wound #6 Right Calcaneus: Return Appointment in 1 week. Edema Control: Wound #6 Right Calcaneus: Elevate legs to the level of the heart and pump ankles as often as possible Off-Loading: Wound #6 Right Calcaneus: Turn and reposition every 2 hours Other: - sage boots float heels Additional Orders / Instructions:  Wound #6 Right  Calcaneus: Increase protein intake. Medications-please add to medication list.: Wound #6 Right Calcaneus: P.O. Antibiotics - continue oral antibiotics as prescribed Santyl Enzymatic Ointment Other: - Vitamin C, Vitamin A, Zinc, Multivitamin Radiology ordered were: X-ray, heel - right reviewing her culture report I have recommended: 1. Continue on Keflex which was started yesterday and she will take this for a week 2. X-ray of the right foot -- not yet done and orders given again 3. Santyl ointment and packing this wound daily 4. Offloading the heel in every possible way and this has been discussed with the family 5. Protein supplement diet with vitamin A, vitamin C and zinc 6. Regular visits the wound center. CLEMENTINA, MARENO ([id number]) due to the noncompressible right ABI I asked the family if they're interested in getting a arterial duplex study done and if needed possible intervention and they declined this at this time.. Her daughter who is the caregiver has been at the bedside and all questions have been answered Electronic Signature(s) Signed: 06/17/2015 8:33:01 AM By: Evlyn Kanner MD, FACS Entered By: Evlyn Kanner on 06/17/2015 08:33:01 Gina [name] ([id number]) -------------------------------------------------------------------------------- SuperBill Details Patient Name: Gina [name] Date of Service: 06/17/2015 Medical Record Number: [id number] Patient Account Number: [id number] Date of Birth/Sex: [birth year] (82 y.o. Female) Treating RN: Ashok Cordia, Debi Primary Care Physician: Einar Crow Other Clinician: Referring Physician: Einar Crow Treating Physician/Extender: Rudene Re in Treatment: 1 Diagnosis Coding ICD-10 Codes Code Description E11.621 Type 2 diabetes mellitus with foot ulcer L89.613 Pressure ulcer of right heel, stage 3 F03.90 Unspecified dementia without behavioral disturbance Facility Procedures CPT4 Code:  08657846 Description: 11042 - DEB SUBQ TISSUE 20 SQ CM/< ICD-10 Description Diagnosis E11.621 Type 2 diabetes mellitus with foot ulcer L89.613 Pressure ulcer of right heel, stage 3 Modifier: Quantity: 1 Physician Procedures CPT4 Code: [id number] Description: 99213 - WC PHYS LEVEL 3 - EST PT ICD-10 Description Diagnosis E11.621 Type 2 diabetes mellitus with foot ulcer L89.613 Pressure ulcer of right heel, stage 3 F03.90 Unspecified dementia without behavioral disturba Modifier: 25 nce Quantity: 1 CPT4 Code: [id number] Description: 11042 - WC PHYS SUBQ TISS 20 SQ CM ICD-10 Description Diagnosis E11.621 Type 2 diabetes mellitus with foot ulcer L89.613 Pressure ulcer of right heel, stage 3 Modifier: Quantity: 1 Electronic Signature(s) Signed: 06/17/2015 8:33:17 AM By: Evlyn Kanner MD, FACS Entered By: Evlyn Kanner on 06/17/2015 08:33:17

## 2015-06-18 NOTE — Progress Notes (Signed)
Gina [name] ([id number]) Visit Report for 06/17/2015 Arrival Information Details Patient Name: Gina [name], Gina [name]. Date of Service: 06/17/2015 8:00 AM Medical Record Number: [id number] Patient Account Number: [id number] Date of Birth/Sex: Jul 19, 1917 (82 y.o. Female) Treating RN: Ashok Cordia, Debi Primary Care Physician: Einar Crow Other Clinician: Referring Physician: Einar Crow Treating Physician/Extender: Rudene Re in Treatment: 1 Visit Information History Since Last Visit All ordered tests and consults were completed: No Patient Arrived: Wheel Chair Added or deleted any medications: No Arrival Time: 08:04 Any new allergies or adverse reactions: No Accompanied By: daughter Had a fall or experienced change in No Transfer Assistance: EasyP[employer] Patient activities of daily living that may affect Lift risk of falls: Patient Identification Verified: Yes Signs or symptoms of abuse/neglect since last No Secondary Verification Yes visito Process Completed: Hospitalized since last visit: No Patient Requires No Pain Present Now: No Transmission-Based Precautions: Patient Has Alerts: Yes Patient Alerts: DM II ABI right noncompressible Electronic Signature(s) Signed: 06/17/2015 5:42:36 PM By: Alejandro Mulling Entered By: Alejandro Mulling on 06/17/2015 08:05:15 Gina [name] ([id number]) -------------------------------------------------------------------------------- Encounter Discharge Information Details Patient Name: Gina [name] Date of Service: 06/17/2015 8:00 AM Medical Record Number: [id number] Patient Account Number: [id number] Date of Birth/Sex: Mar 23, 1917 (82 y.o. Female) Treating RN: Ashok Cordia, Debi Primary Care Physician: Einar Crow Other Clinician: Referring Physician: Einar Crow Treating Physician/Extender: Rudene Re in Treatment: 1 Encounter Discharge Information Items Discharge Pain Level:  0 Discharge Condition: Stable Ambulatory Status: Wheelchair Discharge Destination: Nursing Home Transportation: Other Accompanied By: daughter Schedule Follow-up Appointment: Yes Medication Reconciliation completed and provided to Patient/Care Yes Garik Diamant: Provided on Clinical Summary of Care: 06/17/2015 Form Type Recipient Paper Patient LW Electronic Signature(s) Signed: 06/17/2015 5:42:36 PM By: Alejandro Mulling Previous Signature: 06/17/2015 8:44:00 AM Version By: Gwenlyn Perking Entered By: Alejandro Mulling on 06/17/2015 08:45:44 Gina [name] ([id number]) -------------------------------------------------------------------------------- Lower Extremity Assessment Details Patient Name: Gina [name] Date of Service: 06/17/2015 8:00 AM Medical Record Number: [id number] Patient Account Number: [id number] Date of Birth/Sex: 12/24/1917 (82 y.o. Female) Treating RN: Ashok Cordia, Debi Primary Care Physician: Einar Crow Other Clinician: Referring Physician: Einar Crow Treating Physician/Extender: Rudene Re in Treatment: 1 Vascular Assessment Pulses: Posterior Tibial Dorsalis Pedis Palpable: [Right:Yes] Extremity colors, hair growth, and conditions: Extremity Color: [Right:Normal] Temperature of Extremity: [Right:Cool] Capillary Refill: [Right:> 3 seconds] Toe Nail Assessment Left: Right: Thick: Yes Discolored: Yes Deformed: Yes Improper Length and Hygiene: No Electronic Signature(s) Signed: 06/17/2015 5:42:36 PM By: Alejandro Mulling Entered By: Alejandro Mulling on 06/17/2015 08:46:54 Gina [name] ([id number]) -------------------------------------------------------------------------------- Multi Wound Chart Details Patient Name: Gina [name] Date of Service: 06/17/2015 8:00 AM Medical Record Number: [id number] Patient Account Number: [id number] Date of Birth/Sex: 05-10-1917 (82 y.o. 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] [N/A:N/A] Secondary Etiology: [6:Pressure Ulcer] [N/A:N/A] Comorbid History: [6:Cataracts, Chronic sinus problems/congestion, Hypertension, Type II Diabetes, Osteoarthritis, Dementia, Seizure Disorder] [N/A:N/A] Date Acquired: [6:02/02/2015] [N/A:N/A] Weeks of Treatment: [6:1] [N/A:N/A] Wound Status: [6:Open] [N/A:N/A] Measurements L x W x D 1.8x3x1.2 [N/A:N/A] (cm) Area (cm) : [6:4.241] [N/A:N/A] Volume (cm) : [6:5.089] [N/A:N/A] % Reduction in Area: [6:6.10%] [N/A:N/A] % Reduction in Volume: -275.60% [N/A:N/A] Classification: [6:Grade 1] [  N/A:N/A] Exudate Amount: [6:Large] [N/A:N/A] Exudate Type: [6:Purulent] [N/A:N/A] Exudate Color: [6:yellow, brown, green] [N/A:N/A] Foul Odor After [6:Yes] [N/A:N/A] Cleansing: Odor Anticipated Due to No [N/A:N/A] Product Use: Wound Margin: [6:Thickened] [N/A:N/A] Granulation Amount: None Present (0%) N/A N/A Necrotic Amount: Large (67-100%) N/A N/A Exposed Structures: Fascia: No N/A N/A Fat: No Tendon: No Muscle: No Joint: No Bone: No Limited to Skin Breakdown Epithelialization: None N/A N/A Periwound Skin Texture: Edema: Yes N/A N/A Periwound Skin Moist: Yes N/A N/A Moisture: Periwound Skin Color: Erythema: Yes N/A N/A Erythema Location: Circumferential N/A N/A Temperature: No Abnormality N/A N/A Tenderness on Yes N/A N/A Palpation: Wound Preparation: Ulcer Cleansing: N/A N/A Rinsed/Irrigated  with Saline Topical Anesthetic Applied: Other: lidocaine 4% Treatment Notes Electronic Signature(s) Signed: 06/17/2015 5:42:36 PM By: Alejandro Mulling Entered By: Alejandro Mulling on 06/17/2015 08:22:06 Gina [name] ([id number]) -------------------------------------------------------------------------------- Multi-Disciplinary Care Plan Details Patient Name: Gina [name] Date of Service: 06/17/2015 8:00 AM Medical Record Number: [id number] Patient Account Number: [id number] Date of Birth/Sex: 03/19/1917 (82 y.o. Female) Treating RN: Ashok Cordia, Debi Primary Care Physician: Einar Crow Other Clinician: Referring Physician: Einar Crow Treating Physician/Extender: Rudene Re in Treatment: 1 Active Inactive Abuse / Safety / Falls / Self Care Management Nursing Diagnoses: Potential for falls Goals: Patient will remain injury free Date Initiated: 06/09/2015 Goal Status: Active Interventions: Assess fall risk on admission and as needed Notes: Nutrition Nursing Diagnoses: Imbalanced nutrition Impaired glucose control: actual or potential Potential for alteratiion in Nutrition/Potential for imbalanced nutrition Goals: Patient/caregiver agrees to and verbalizes understanding of need to use nutritional supplements and/or vitamins as prescribed Date Initiated: 06/09/2015 Goal Status: Active Patient/caregiver verbalizes understanding of need to maintain therapeutic glucose control per primary care physician Date Initiated: 06/09/2015 Goal Status: Active Patient/caregiver will maintain therapeutic glucose control Date Initiated: 06/09/2015 Goal Status: Active Interventions: Assess patient nutrition upon admission and as needed per policy CECILY, LAWHORNE ([id number]) Notes: Orientation to the Wound Care Program Nursing Diagnoses: Knowledge deficit related to the wound healing center program Goals: Patient/caregiver will verbalize understanding  of the Wound Healing Center Program Date Initiated: 06/09/2015 Goal Status: Active Interventions: Provide education on orientation to the wound center Notes: Pain, Acute or Chronic Nursing Diagnoses: Pain, acute or chronic: actual or potential Potential alteration in comfort, pain Goals: Patient will verbalize adequate pain control and receive pain control interventions during procedures as needed Date Initiated: 06/09/2015 Goal Status: Active Interventions: Assess comfort goal upon admission Complete pain assessment as per visit requirements Notes: Pressure Nursing Diagnoses: Knowledge deficit related to causes and risk factors for pressure ulcer development Knowledge deficit related to management of pressures ulcers Goals: Patient will remain free from development of additional pressure ulcers Date Initiated: 06/09/2015 Goal Status: Active Interventions: ALEATHEA, PUGMIRE ([id number]) Assess: immobility, friction, shearing, incontinence upon admission and as needed Assess offloading mechanisms upon admission and as needed Notes: Wound/Skin Impairment Nursing Diagnoses: Impaired tissue integrity Goals: Ulcer/skin breakdown will have a volume reduction of 30% by week 4 Date Initiated: 06/09/2015 Goal Status: Active Ulcer/skin breakdown will have a volume reduction of 50% by week 8 Date Initiated: 06/09/2015 Goal Status: Active Ulcer/skin breakdown will have a volume reduction of 80% by week 12 Date Initiated: 06/09/2015 Goal Status: Active Interventions: Assess ulceration(s) every visit Notes: Electronic Signature(s) Signed: 06/17/2015 5:42:36 PM By: Alejandro Mulling Entered By: Alejandro Mulling on 06/17/2015 08:21:33 Gina [name] ([id number]) -------------------------------------------------------------------------------- Pain Assessment Details Patient Name: Gina [name] Date of Service: 06/17/2015 8:00 AM Medical Record Number: [id number] Patient Account  Number: [id number] Date of Birth/Sex: [birth year] (82 y.o. Female) Treating RN: Ashok CordiaPinkerton, Debi Primary Care Physician: Einar CrowAnderson, Marshall Other Clinician: Referring Physician: Einar CrowAnderson, Marshall Treating Physician/Extender: Rudene ReBritto, Errol Weeks in Treatment: 1 Active Problems Location of Pain Severity and Description of Pain Patient Has Paino No Site Locations Pain Management and Medication Current Pain Management: Electronic Signature(s) Signed: 06/17/2015 5:42:36 PM By: Alejandro MullingPinkerton, Debra Entered By: Alejandro MullingPinkerton, Debra on 06/17/2015 08:05:21 Gina BossWILLIAMS, Assunta H. ([id number]) -------------------------------------------------------------------------------- Patient/Caregiver Education Details Patient Name: Gina BossWILLIAMS, Arthella H. Date of Service: 06/17/2015 8:00 AM Medical Record Number: [id number] Patient Account Number: [id number] Date of Birth/Gender: [birth year] (82 y.o. Female) Treating RN: Ashok CordiaPinkerton, Debi Primary Care Physician: Einar CrowAnderson, Marshall Other Clinician: Referring Physician: Einar CrowAnderson, Marshall Treating Physician/Extender: Rudene ReBritto, Errol Weeks in Treatment: 1 Education Assessment Education Provided To: Patient Education Topics Provided Wound/Skin Impairment: Handouts: Other: change dressing as ordered Methods: Demonstration, Explain/Verbal Responses: State content correctly Electronic Signature(s) Signed: 06/17/2015 5:42:36 PM By: Alejandro MullingPinkerton, Debra Entered By: Alejandro MullingPinkerton, Debra on 06/17/2015 08:45:58 Gina BossWILLIAMS, Aubriel H. ([id number]) -------------------------------------------------------------------------------- Wound Assessment Details Patient Name: Gina BossWILLIAMS, Nalina H. Date of Service: 06/17/2015 8:00 AM Medical Record Number: [id number] Patient Account Number: [id number] Date of Birth/Sex: [birth year] (82 y.o. Female) Treating RN: Ashok CordiaPinkerton, Debi Primary Care Physician: Einar CrowAnderson, Marshall Other Clinician: Referring Physician: Einar CrowAnderson, Marshall Treating  Physician/Extender: Rudene ReBritto, Errol Weeks in Treatment: 1 Wound Status Wound Number: 6 Primary Diabetic Wound/Ulcer of the Lower Etiology: Extremity Wound Location: Right Calcaneus Secondary Pressure Ulcer Wounding Event: Pressure Injury Etiology: Date Acquired: 02/02/2015 Wound Open Weeks Of Treatment: 1 Status: Clustered Wound: No Comorbid Cataracts, Chronic sinus History: problems/congestion, Hypertension, Type II Diabetes, Osteoarthritis, Dementia, Seizure Disorder Photos Photo Uploaded By: Alejandro MullingPinkerton, Debra on 06/17/2015 17:25:25 Wound Measurements Length: (cm) 1.8 % Reduction in Width: (cm) 3 % Reduction in Depth: (cm) 1.2 Epithelializati Area: (cm) 4.241 Tunneling: Volume: (cm) 5.089 Undermining: Area: 6.1% Volume: -275.6% on: None No No Wound Description Classification: Grade 1 Wound Margin: Thickened Exudate Amount: Large Exudate Type: Purulent Exudate Color: yellow, brown, green Foul Odor After Cleansing: Yes Due to Product Use: No Wound Bed Granulation Amount: None Present (0%) Exposed Structure Gina BossWILLIAMS, Kayla H. ([id number]) Necrotic Amount: Large (67-100%) Fascia Exposed: No Necrotic Quality: Adherent Slough Fat Layer Exposed: No Tendon Exposed: No Muscle Exposed: No Joint Exposed: No Bone Exposed: No Limited to Skin Breakdown Periwound Skin Texture Texture Color No Abnormalities Noted: No No Abnormalities Noted: No Localized Edema: Yes Erythema: Yes Erythema Location: Circumferential Moisture No Abnormalities Noted: No Temperature / Pain Moist: Yes Temperature: No Abnormality Tenderness on Palpation: Yes Wound Preparation Ulcer Cleansing: Rinsed/Irrigated with Saline Topical Anesthetic Applied: Other: lidocaine 4%, Treatment Notes Wound #6 (Right Calcaneus) 1. Cleansed with: Clean wound with Normal Saline 2. Anesthetic Topical Lidocaine 4% cream to wound bed prior to debridement 3. Peri-wound Care: Barrier cream 4. Dressing  Applied: Santyl Ointment 5. Secondary Dressing Applied ABD Pad Dry Gauze Foam Kerlix/Conform 7. Secured with Tape Notes netting Electronic Signature(s) Signed: 06/17/2015 5:42:36 PM By: Alejandro MullingPinkerton, Debra Entered By: Alejandro MullingPinkerton, Debra on 06/17/2015 08:15:14 Gina BossWILLIAMS, Marlei H. ([id number]) -------------------------------------------------------------------------------- Vitals Details Patient Name: Gina BossWILLIAMS, Susie H. Date of Service: 06/17/2015 8:00 AM Medical Record Number: [id number] Patient Account Number: [id number] Date of Birth/Sex: [birth year] (82 y.o. Female) Treating RN: Ashok CordiaPinkerton, Debi Primary Care Physician: Einar CrowAnderson, Marshall Other Clinician: Referring Physician: Einar CrowAnderson, Marshall Treating Physician/Extender: Rudene ReBritto, Errol Weeks in Treatment: 1 Vital Signs Time Taken: 08:05 Temperature (F): 98.0 Height (in): 63 Pulse (bpm): 88 Weight (lbs): 142 Respiratory Rate (breaths/min): 16 Body Mass Index (BMI): 25.2 Blood Pressure (mmHg): 147/77  Reference Range: 80 - 120 mg / dl Electronic Signature(s) Signed: 06/17/2015 5:42:36 PM By: Alejandro Mulling Entered By: Alejandro Mulling on 06/17/2015 08:06:58

## 2015-06-24 ENCOUNTER — Encounter: Payer: PPO | Admitting: Surgery

## 2015-06-24 DIAGNOSIS — E11621 Type 2 diabetes mellitus with foot ulcer: Secondary | ICD-10-CM | POA: Diagnosis not present

## 2015-06-24 DIAGNOSIS — F0390 Unspecified dementia without behavioral disturbance: Secondary | ICD-10-CM | POA: Diagnosis not present

## 2015-06-24 DIAGNOSIS — L89613 Pressure ulcer of right heel, stage 3: Secondary | ICD-10-CM | POA: Diagnosis not present

## 2015-06-24 DIAGNOSIS — S51801A Unspecified open wound of right forearm, initial encounter: Secondary | ICD-10-CM | POA: Diagnosis not present

## 2015-07-01 ENCOUNTER — Encounter: Payer: PPO | Admitting: Surgery

## 2015-07-01 DIAGNOSIS — E11621 Type 2 diabetes mellitus with foot ulcer: Secondary | ICD-10-CM | POA: Diagnosis not present

## 2015-07-01 DIAGNOSIS — L89613 Pressure ulcer of right heel, stage 3: Secondary | ICD-10-CM | POA: Diagnosis not present

## 2015-07-01 DIAGNOSIS — F0390 Unspecified dementia without behavioral disturbance: Secondary | ICD-10-CM | POA: Diagnosis not present

## 2015-07-02 NOTE — Progress Notes (Signed)
ZARRAH, LOVELAND ([id number]) Visit Report for 07/01/2015 Chief Complaint Document Details Patient Name: Gina [name], Gina [name] 07/01/2015 8:00 Date of Service: AM Medical Record [id number] Number: Patient Account Number: [id number] 1917-12-09 (82 y.o. Treating RN: Phillis Haggis Date of Birth/Sex: Female) Other Clinician: Primary Care Physician: Einar Crow Treating Evlyn Kanner Referring Physician: Einar Crow Physician/Extender: Tania Ade in Treatment: 3 Information Obtained from: Patient Chief Complaint Patient is at the clinic for treatment of an open pressure ulcer to the right heel which she's had for about 4 months Electronic Signature(s) Signed: 07/01/2015 9:12:28 AM By: Evlyn Kanner MD, FACS Entered By: Evlyn Kanner on 07/01/2015 09:12:28 Gina [name] ([id number]) -------------------------------------------------------------------------------- Debridement Details Patient Name: Gina [name] 07/01/2015 8:00 Date of Service: AM Medical Record [id number] Number: Patient Account Number: [id number] [birth year] (82 y.o. Treating RN: Phillis Haggis Date of Birth/Sex: Female) Other Clinician: Primary Care Physician: Einar Crow Treating Evlyn Kanner Referring Physician: Einar Crow Physician/Extender: Tania Ade in Treatment: 3 Debridement Performed for Wound #6 Right Calcaneus Assessment: Performed By: Physician Evlyn Kanner, MD Debridement: Debridement Pre-procedure Yes Verification/Time Out Taken: Start Time: 08:57 Pain Control: Lidocaine 4% Topical Solution Level: Skin/Subcutaneous Tissue Total Area Debrided (L x 1.5 (cm) x 2.3 (cm) = 3.45 (cm) W): Tissue and other Viable, Non-Viable, Exudate, Fibrin/Slough, Subcutaneous material debrided: Instrument: Forceps, Scissors Bleeding: Minimum Hemostasis Achieved: Pressure End Time: 09:01 Procedural Pain: 0 Post Procedural Pain: 0 Response to Treatment: Procedure was  tolerated well Post Debridement Measurements of Total Wound Length: (cm) 1.5 Width: (cm) 2.3 Depth: (cm) 0.3 Volume: (cm) 0.813 Post Procedure Diagnosis Same as Pre-procedure Electronic Signature(s) Signed: 07/01/2015 9:12:21 AM By: Evlyn Kanner MD, FACS Signed: 07/01/2015 5:29:01 PM By: Alejandro Mulling Entered By: Evlyn Kanner on 07/01/2015 09:12:21 Gina [name] ([id number]) -------------------------------------------------------------------------------- HPI Details Patient Name: Gina [name], Gina [name] 07/01/2015 8:00 Date of Service: AM Medical Record [id number] Number: Patient Account Number: [id number] Apr 17, 1917 (82 y.o. Treating RN: Phillis Haggis Date of Birth/Sex: Female) Other Clinician: Primary Care Physician: Einar Crow Treating Evlyn Kanner Referring Physician: Einar Crow Physician/Extender: Tania Ade in Treatment: 3 History of Present Illness Location: injury to the right heel with drainage Quality: Patient reports No Pain. Severity: Patient states wound are getting worse. Duration: Patient has had the wound for > 4 months prior to seeking treatment at the wound center Timing: Pain in wound is Intermittent (comes and goes Context: The wound appeared gradually over time Modifying Factors: Other treatment(s) tried include:was seen by her PCP and put on Keflex recently but the dose is complete Associated Signs and Symptoms: Patient reports having foul odor. HPI Description: 82 year old patient who is a resident of a nursing home who comes to see as with a decubitus ulcer of the right heel. He has a past medical history of diabetes mellitus, hypercholesterolemia, hypothyroidism, GERD, hypertension, dementia, chronic kidney disease and age-related osteoporosis. She is status post cholecystectomy, hysterectomy, back fusion, debridement of necrotic ulcer right thigh in February 2017. She has never been a smoker. He has been seen at our wound  center in the past and was treated in 2015. She was recently seen by her PCP Dr. Einar Crow, for a heel ulcer and he had recommend Keflex and local dressing. 06/17/2015 -- grew Escherichia coli which was treated by Dr. Leanord Hawking with Keflex 500 mg by mouth 3 times a day for 7 days. Culture results were noted -- wound culture grew Escherichia coli sensitive to Bactrim, ampicillin, ampicillin sulbactam, Kefzol and, cefepime. 06/24/2015 -- x-ray of the right foot was done and this  showed no evidence of osteomyelitis. Electronic Signature(s) Signed: 07/01/2015 9:12:33 AM By: Evlyn KannerBritto, Ledford Goodson MD, FACS Entered By: Evlyn KannerBritto, Myisha Pickerel on 07/01/2015 09:12:32 Gina BossWILLIAMS, [name]. ([id number]) -------------------------------------------------------------------------------- Physical Exam Details Patient Name: Gina BossWILLIAMS, [name]. 07/01/2015 8:00 Date of Service: AM Medical Record [id number] Number: Patient Account Number: [id number] [birth year] (82 y.o. Treating RN: Phillis HaggisPinkerton, Debi Date of Birth/Sex: Female) Other Clinician: Primary Care Physician: Einar CrowAnderson, Marshall Treating Evlyn KannerBritto, Nycole Kawahara Referring Physician: Einar CrowAnderson, Marshall Physician/Extender: Tania AdeWeeks in Treatment: 3 Constitutional . Pulse regular. Respirations normal and unlabored. Afebrile. . Eyes Nonicteric. Reactive to light. Ears, Nose, Mouth, and Throat Lips, teeth, and gums WNL.Marland Kitchen. Moist mucosa without lesions. Neck supple and nontender. No palpable supraclavicular or cervical adenopathy. Normal sized without goiter. Respiratory WNL. No retractions.. Cardiovascular Pedal Pulses WNL. No clubbing, cyanosis or edema. Lymphatic No adneopathy. No adenopathy. No adenopathy. Musculoskeletal Adexa without tenderness or enlargement.. Digits and nails w/o clubbing, cyanosis, infection, petechiae, ischemia, or inflammatory conditions.. Integumentary (Hair, Skin) No suspicious lesions. No crepitus or fluctuance. No peri-wound warmth or erythema.  No masses.Marland Kitchen. Psychiatric Judgement and insight Intact.. No evidence of depression, anxiety, or agitation.. Notes using a forcep and scissors I sharply debrided quite a bit of subcutaneous debris from the right calcaneal area and it does not probe down to bone. Minimal bleeding controlled with pressure. Electronic Signature(s) Signed: 07/01/2015 9:13:02 AM By: Evlyn KannerBritto, Evalin Shawhan MD, FACS Entered By: Evlyn KannerBritto, Airrion Otting on 07/01/2015 09:13:02 Gina BossWILLIAMS, [name]. ([id number]) -------------------------------------------------------------------------------- Physician Orders Details Patient Name: Gina BossWILLIAMS, Charese H. 07/01/2015 8:00 Date of Service: AM Medical Record [id number] Number: Patient Account Number: [id number] [birth year] (82 y.o. Treating RN: Phillis HaggisPinkerton, Debi Date of Birth/Sex: Female) Other Clinician: Primary Care Physician: Einar CrowAnderson, Marshall Treating Evlyn KannerBritto, Calie Buttrey Referring Physician: Einar CrowAnderson, Marshall Physician/Extender: Tania AdeWeeks in Treatment: 3 Verbal / Phone Orders: Yes Clinician: Ashok CordiaPinkerton, Debi Read Back and Verified: Yes Diagnosis Coding Wound Cleansing Wound #6 Right Calcaneus o Clean wound with Normal Saline. o Cleanse wound with mild soap and water Anesthetic Wound #6 Right Calcaneus o Topical Lidocaine 4% cream applied to wound bed prior to debridement - for clinic use Skin Barriers/Peri-Wound Care Wound #6 Right Calcaneus o Barrier cream - zinc Primary Wound Dressing Wound #6 Right Calcaneus o Santyl Ointment Secondary Dressing Wound #6 Right Calcaneus o Gauze, ABD and Kerlix/Conform - netting, tape o Foam Dressing Change Frequency Wound #6 Right Calcaneus o Change dressing every day. Follow-up Appointments Wound #6 Right Calcaneus o Return Appointment in 1 week. Edema Control Wound #6 Right Calcaneus o Elevate legs to the level of the heart and pump ankles as often as possible Gina BossWILLIAMS, [name]. ([id number]) Off-Loading Wound #6 Right  Calcaneus o Turn and reposition every 2 hours o Other: - sage boots float heels Additional Orders / Instructions Wound #6 Right Calcaneus o Increase protein intake. Medications-please add to medication list. Wound #6 Right Calcaneus o P.O. Antibiotics - continue oral antibiotics as prescribed o Santyl Enzymatic Ointment o Other: - Vitamin C, Vitamin A, Zinc, Multivitamin Electronic Signature(s) Signed: 07/01/2015 4:47:37 PM By: Evlyn KannerBritto, Darick Fetters MD, FACS Signed: 07/01/2015 5:29:01 PM By: Alejandro MullingPinkerton, Debra Entered By: Alejandro MullingPinkerton, Debra on 07/01/2015 09:04:36 Gina BossWILLIAMS, [name]. ([id number]) -------------------------------------------------------------------------------- Prescription 07/01/2015 Patient Name: Gina BossWILLIAMS, [name]. Physician: Evlyn KannerBritto, Dealva Lafoy MD Date of Birth: [birth year] NPI#: [id number] Sex: F DEA#: [id number] Phone #: [phone] License #: Patient Address: [facility] [address] [place], [place] [address] [facility] [address], [address] [place], [place] [address] [phone] Allergies No Known Drug Allergies Physician's Orders Santyl Enzymatic Ointment  Signature(s): Date(s): Psychologist, prison and probation serviceslectronic Signature(s) Signed: 07/01/2015 4:47:37 PM By: Evlyn KannerBritto, Graham Hyun MD, FACS Signed: 07/01/2015 5:29:01 PM By: Alejandro MullingPinkerton, Debra Entered By: Alejandro MullingPinkerton, Debra on 07/01/2015 09:04:36 Gina BossWILLIAMS, Natahlia H. ([id number]) --------------------------------------------------------------------------------  Problem List Details Patient Name: Gina BossWILLIAMS, [name]. 07/01/2015 8:00 Date of Service: AM Medical Record [id number] Number: Patient Account Number: [id number] 01/30/1917 (82 y.o. Treating RN: Phillis HaggisPinkerton, Debi Date of Birth/Sex: Female) Other Clinician: Primary Care Physician: Einar CrowAnderson, Marshall Treating Evlyn KannerBritto, Steve Gregg Referring Physician: Einar CrowAnderson, Marshall Physician/Extender: Tania AdeWeeks in Treatment: 3 Active  Problems ICD-10 Encounter Code Description Active Date Diagnosis E11.621 Type 2 diabetes mellitus with foot ulcer 06/09/2015 Yes L89.613 Pressure ulcer of right heel, stage 3 06/09/2015 Yes F03.90 Unspecified dementia without behavioral disturbance 06/09/2015 Yes Inactive Problems Resolved Problems Electronic Signature(s) Signed: 07/01/2015 9:11:45 AM By: Evlyn KannerBritto, Jaque Dacy MD, FACS Entered By: Evlyn KannerBritto, Ismael Treptow on 07/01/2015 09:11:45 Gina BossWILLIAMS, Quanika H. ([id number]) -------------------------------------------------------------------------------- Progress Note Details Patient Name: Gina BossWILLIAMS, [name]. 07/01/2015 8:00 Date of Service: AM Medical Record [id number] Number: Patient Account Number: [id number] 01/30/1917 (82 y.o. Treating RN: Phillis HaggisPinkerton, Debi Date of Birth/Sex: Female) Other Clinician: Primary Care Physician: Einar CrowAnderson, Marshall Treating Evlyn KannerBritto, Uriah Philipson Referring Physician: Einar CrowAnderson, Marshall Physician/Extender: Tania AdeWeeks in Treatment: 3 Subjective Chief Complaint Information obtained from Patient Patient is at the clinic for treatment of an open pressure ulcer to the right heel which she's had for about 4 months History of Present Illness (HPI) The following HPI elements were documented for the patient's wound: Location: injury to the right heel with drainage Quality: Patient reports No Pain. Severity: Patient states wound are getting worse. Duration: Patient has had the wound for > 4 months prior to seeking treatment at the wound center Timing: Pain in wound is Intermittent (comes and goes Context: The wound appeared gradually over time Modifying Factors: Other treatment(s) tried include:was seen by her PCP and put on Keflex recently but the dose is complete Associated Signs and Symptoms: Patient reports having foul odor. 82 year old patient who is a resident of a nursing home who comes to see as with a decubitus ulcer of the right heel. He has a past medical history of diabetes  mellitus, hypercholesterolemia, hypothyroidism, GERD, hypertension, dementia, chronic kidney disease and age-related osteoporosis. She is status post cholecystectomy, hysterectomy, back fusion, debridement of necrotic ulcer right thigh in February 2017. She has never been a smoker. He has been seen at our wound center in the past and was treated in 2015. She was recently seen by her PCP Dr. Einar CrowMarshall Anderson, for a heel ulcer and he had recommend Keflex and local dressing. 06/17/2015 -- grew Escherichia coli which was treated by Dr. Leanord Hawkingobson with Keflex 500 mg by mouth 3 times a day for 7 days. Culture results were noted -- wound culture grew Escherichia coli sensitive to Bactrim, ampicillin, ampicillin sulbactam, Kefzol and, cefepime. 06/24/2015 -- x-ray of the right foot was done and this showed no evidence of osteomyelitis. Gina BossWILLIAMS, Shanigua H. ([id number]) Objective Constitutional Pulse regular. Respirations normal and unlabored. Afebrile. Vitals Time Taken: 8:25 AM, Height: 63 in, Weight: 142 lbs, BMI: 25.2, Temperature: 97.6 F, Pulse: 86 bpm, Respiratory Rate: 16 breaths/min, Blood Pressure: 166/70 mmHg. Eyes Nonicteric. Reactive to light. Ears, Nose, Mouth, and Throat Lips, teeth, and gums WNL.Marland Kitchen. Moist mucosa without lesions. Neck supple and nontender. No palpable supraclavicular or cervical adenopathy. Normal sized without goiter. Respiratory WNL. No retractions.. Cardiovascular Pedal Pulses WNL. No clubbing, cyanosis or edema. Lymphatic No adneopathy. No adenopathy. No adenopathy. Musculoskeletal Adexa without tenderness or enlargement.. Digits and nails w/o clubbing, cyanosis, infection,  petechiae, ischemia, or inflammatory conditions.Marland Kitchen Psychiatric Judgement and insight Intact.. No evidence of depression, anxiety, or agitation.. General Notes: using a forcep and scissors I sharply debrided quite a bit of subcutaneous debris from the right calcaneal area and it does not  probe down to bone. Minimal bleeding controlled with pressure. Integumentary (Hair, Skin) No suspicious lesions. No crepitus or fluctuance. No peri-wound warmth or erythema. No masses.. Wound #6 status is Open. Original cause of wound was Pressure Injury. The wound is located on the Right Calcaneus. The wound measures 1.5cm length x 2.3cm width x 0.3cm depth; 2.71cm^2 area and 0.813cm^3 volume. The wound is limited to skin breakdown. There is no tunneling or undermining noted. There is a large amount of serous drainage noted. The wound margin is thickened. There is small (1-33%) red granulation within the wound bed. There is a large (67-100%) amount of necrotic tissue within the wound bed including Adherent Slough. The periwound skin appearance had no abnormalities noted for texture. The periwound skin appearance did not exhibit: Dry/Scaly, Maceration, Moist, Atrophie Blanche, Cyanosis, Ecchymosis, Hemosiderin Staining, Mottled, Pallor, Rubor, Erythema. Periwound temperature [place], Jomayra H. ([id number]) was noted as No Abnormality. The periwound has tenderness on palpation. Wound #7 status is Healed - Epithelialized. Original cause of wound was Gradually Appeared. The wound is located on the Right,Distal,Anterior Forearm. The wound measures 0cm length x 0cm width x 0cm depth; 0cm^2 area and 0cm^3 volume. Wound #8 status is Healed - Epithelialized. Original cause of wound was Gradually Appeared. The wound is located on the Right,Proximal,Anterior Forearm. The wound measures 0cm length x 0cm width x 0cm depth; 0cm^2 area and 0cm^3 volume. Assessment Active Problems ICD-10 E11.621 - Type 2 diabetes mellitus with foot ulcer L89.613 - Pressure ulcer of right heel, stage 3 F03.90 - Unspecified dementia without behavioral disturbance Procedures Wound #6 Wound #6 is a Diabetic Wound/Ulcer of the Lower Extremity located on the Right Calcaneus . There was a Skin/Subcutaneous Tissue Debridement  ([id number]) debridement with total area of 3.45 sq cm performed by Evlyn Kanner, MD. with the following instrument(s): Forceps and Scissors to remove Viable and Non-Viable tissue/material including Exudate, Fibrin/Slough, and Subcutaneous after achieving pain control using Lidocaine 4% Topical Solution. A time out was conducted prior to the start of the procedure. A Minimum amount of bleeding was controlled with Pressure. The procedure was tolerated well with a pain level of 0 throughout and a pain level of 0 following the procedure. Post Debridement Measurements: 1.5cm length x 2.3cm width x 0.3cm depth; 0.813cm^3 volume. Post procedure Diagnosis Wound #6: Same as Pre-Procedure Plan Wound Cleansing: Wound #6 Right Calcaneus: RIDHI, HOFFERT. ([id number]) Clean wound with Normal Saline. Cleanse wound with mild soap and water Anesthetic: Wound #6 Right Calcaneus: Topical Lidocaine 4% cream applied to wound bed prior to debridement - for clinic use Skin Barriers/Peri-Wound Care: Wound #6 Right Calcaneus: Barrier cream - zinc Primary Wound Dressing: Wound #6 Right Calcaneus: Santyl Ointment Secondary Dressing: Wound #6 Right Calcaneus: Gauze, ABD and Kerlix/Conform - netting, tape Foam Dressing Change Frequency: Wound #6 Right Calcaneus: Change dressing every day. Follow-up Appointments: Wound #6 Right Calcaneus: Return Appointment in 1 week. Edema Control: Wound #6 Right Calcaneus: Elevate legs to the level of the heart and pump ankles as often as possible Off-Loading: Wound #6 Right Calcaneus: Turn and reposition every 2 hours Other: - sage boots float heels Additional Orders / Instructions: Wound #6 Right Calcaneus: Increase protein intake. Medications-please add to medication list.: Wound #6 Right Calcaneus: P.O. Antibiotics -  continue oral antibiotics as prescribed Santyl Enzymatic Ointment Other: - Vitamin C, Vitamin A, Zinc, Multivitamin I have  recommended: 1. Santyl ointment and packing this wound daily 2. Offloading the heel in every possible way and this has been discussed with the family 3. Protein supplement diet with vitamin A, vitamin C and zinc 4. Regular visits the wound center. the daughter who was at the bedside has had all questions answered. SHERETTA, GRUMBINE ([id number]) Electronic Signature(s) Signed: 07/01/2015 9:13:46 AM By: Evlyn Kanner MD, FACS Entered By: Evlyn Kanner on 07/01/2015 09:13:45 Gina [name] ([id number]) -------------------------------------------------------------------------------- SuperBill Details Patient Name: Gina [name] Date of Service: 07/01/2015 Medical Record Number: [id number] Patient Account Number: [id number] Date of Birth/Sex: 12/01/1917 (82 y.o. Female) Treating RN: Phillis Haggis Primary Care Physician: Einar Crow Other Clinician: Referring Physician: Einar Crow Treating Physician/Extender: Rudene Re in Treatment: 3 Diagnosis Coding ICD-10 Codes Code Description E11.621 Type 2 diabetes mellitus with foot ulcer L89.613 Pressure ulcer of right heel, stage 3 F03.90 Unspecified dementia without behavioral disturbance Facility Procedures CPT4 Code: 08657846 Description: 11042 - DEB SUBQ TISSUE 20 SQ CM/< ICD-10 Description Diagnosis E11.621 Type 2 diabetes mellitus with foot ulcer L89.613 Pressure ulcer of right heel, stage 3 F03.90 Unspecified dementia without behavioral disturb Modifier: ance Quantity: 1 Physician Procedures CPT4 Code: [id number] Description: 11042 - WC PHYS SUBQ TISS 20 SQ CM ICD-10 Description Diagnosis E11.621 Type 2 diabetes mellitus with foot ulcer L89.613 Pressure ulcer of right heel, stage 3 F03.90 Unspecified dementia without behavioral disturba Modifier: nce Quantity: 1 Electronic Signature(s) Signed: 07/01/2015 9:13:56 AM By: Evlyn Kanner MD, FACS Entered By: Evlyn Kanner on 07/01/2015 09:13:56

## 2015-07-02 NOTE — Progress Notes (Signed)
MORGANN, WOODBURN ([id number]) Visit Report for 07/01/2015 Arrival Information Details Patient Name: Gina [name], Gina [name]. Date of Service: 07/01/2015 8:00 AM Medical Record Number: [id number] Patient Account Number: [id number] Date of Birth/Sex: Feb 21, 1917 (82 y.o. Female) Treating RN: Ashok Cordia, Debi Primary Care Physician: Einar Crow Other Clinician: Referring Physician: Einar Crow Treating Physician/Extender: Rudene Re in Treatment: 3 Visit Information History Since Last Visit All ordered tests and consults were completed: No Patient Arrived: Wheel Chair Added or deleted any medications: No Arrival Time: 08:08 Any new allergies or adverse reactions: No Accompanied By: daughter Had a fall or experienced change in No Transfer Assistance: EasyPivot Patient activities of daily living that may affect Lift risk of falls: Patient Identification Verified: Yes Signs or symptoms of abuse/neglect since last No Secondary Verification Yes visito Process Completed: Hospitalized since last visit: No Patient Requires No Pain Present Now: No Transmission-Based Precautions: Patient Has Alerts: Yes Patient Alerts: DM II ABI right noncompressible Electronic Signature(s) Signed: 07/01/2015 5:29:01 PM By: Alejandro Mulling Entered By: Alejandro Mulling on 07/01/2015 08:10:01 Gina [name] ([id number]) -------------------------------------------------------------------------------- Encounter Discharge Information Details Patient Name: Gina [name] Date of Service: 07/01/2015 8:00 AM Medical Record Number: [id number] Patient Account Number: [id number] Date of Birth/Sex: 1917/06/26 (82 y.o. Female) Treating RN: Ashok Cordia, Debi Primary Care Physician: Einar Crow Other Clinician: Referring Physician: Einar Crow Treating Physician/Extender: Rudene Re in Treatment: 3 Encounter Discharge Information Items Discharge Pain Level:  0 Discharge Condition: Stable Ambulatory Status: Wheelchair Discharge Destination: Nursing Home Transportation: Private Auto Accompanied By: daughter Schedule Follow-up Appointment: Yes Medication Reconciliation completed and provided to Patient/Care Yes Vadis Slabach: Provided on Clinical Summary of Care: 07/01/2015 Form Type Recipient Paper Patient LW Electronic Signature(s) Signed: 07/01/2015 9:27:24 AM By: Gwenlyn Perking Entered By: Gwenlyn Perking on 07/01/2015 09:27:24 Gina [name] ([id number]) -------------------------------------------------------------------------------- Lower Extremity Assessment Details Patient Name: Gina [name] Date of Service: 07/01/2015 8:00 AM Medical Record Number: [id number] Patient Account Number: [id number] Date of Birth/Sex: December 25, 1917 (82 y.o. Female) Treating RN: Ashok Cordia, Debi Primary Care Physician: Einar Crow Other Clinician: Referring Physician: Einar Crow Treating Physician/Extender: Rudene Re in Treatment: 3 Vascular Assessment Pulses: Posterior Tibial Dorsalis Pedis Palpable: [Right:Yes] Extremity colors, hair growth, and conditions: Extremity Color: [Right:Normal] Temperature of Extremity: [Right:Cool] Toe Nail Assessment Left: Right: Thick: Yes Discolored: Yes Deformed: Yes Improper Length and Hygiene: No Electronic Signature(s) Signed: 07/01/2015 5:29:01 PM By: Alejandro Mulling Entered By: Alejandro Mulling on 07/01/2015 08:11:13 Gina [name] ([id number]) -------------------------------------------------------------------------------- Multi Wound Chart Details Patient Name: Gina [name] Date of Service: 07/01/2015 8:00 AM Medical Record Number: [id number] Patient Account Number: [id number] Date of Birth/Sex: 25-May-1917 (82 y.o. 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] [N/A:N/A] Secondary Etiology: [6:Pressure Ulcer] [N/A:N/A] Comorbid History: [6:Cataracts, Chronic sinus problems/congestion, Hypertension, Type II Diabetes, Osteoarthritis, Dementia, Seizure Disorder] [N/A:N/A] Date Acquired: [6:02/02/2015] [N/A:N/A] Weeks of Treatment: [6:3] [N/A:N/A] Wound Status: [6:Open] [N/A:N/A] Measurements L x W x D 1.5x2.3x0.3 [N/A:N/A] (cm) Area (cm) : [6:2.71] [N/A:N/A] Volume (cm) : [6:0.813] [N/A:N/A] % Reduction in Area: [6:40.00%] [N/A:N/A] % Reduction in Volume: 40.00% [N/A:N/A] Classification: [6:Grade 1] [N/A:N/A] Exudate Amount: [6:Large] [N/A:N/A] Exudate Type: [6:Serous] [N/A:N/A] Exudate Color: [6:amber] [N/A:N/A]  Wound Margin: [6:Thickened] [N/A:N/A] Granulation Amount: [6:Small (1-33%)] [N/A:N/A] Granulation Quality: [6:Red] [N/A:N/A] Necrotic Amount: [6:Large (67-100%)] [N/A:N/A] Exposed Structures: [N/A:N/A] Fascia: No Fat: No Tendon: No Muscle: No Joint: No Bone: No Limited to Skin Breakdown Epithelialization: None N/A N/A Periwound Skin Texture: No Abnormalities Noted N/A N/A Periwound Skin Maceration: No N/A N/A Moisture: Moist: No Dry/Scaly: No Periwound Skin Color: Atrophie Blanche: No N/A N/A Cyanosis: No Ecchymosis: No Erythema: No Hemosiderin Staining: No Mottled: No Pallor: No Rubor: No Temperature: No Abnormality N/A N/A Tenderness on Yes N/A N/A Palpation: Wound Preparation: Ulcer Cleansing: N/A N/A Rinsed/Irrigated with Saline Topical Anesthetic Applied: Other:  lidocaine 4% Treatment Notes Electronic Signature(s) Signed: 07/01/2015 5:29:01 PM By: Alejandro MullingPinkerton, Debra Entered By: Alejandro MullingPinkerton, Debra on 07/01/2015 09:01:18 Gina [name], [name]. ([id number]) -------------------------------------------------------------------------------- Multi-Disciplinary Care Plan Details Patient Name: Gina [name], [name]. Date of Service: 07/01/2015 8:00 AM Medical Record Number: [id number] Patient Account Number: [id number] Date of Birth/Sex: 1917/04/11 (82 y.o. Female) Treating RN: Ashok CordiaPinkerton, Debi Primary Care Physician: Einar CrowAnderson, Marshall Other Clinician: Referring Physician: Einar CrowAnderson, Marshall Treating Physician/Extender: Rudene ReBritto, Errol Weeks in Treatment: 3 Active Inactive Abuse / Safety / Falls / Self Care Management Nursing Diagnoses: Potential for falls Goals: Patient will remain injury free Date Initiated: 06/09/2015 Goal Status: Active Interventions: Assess fall risk on admission and as needed Notes: Nutrition Nursing Diagnoses: Imbalanced nutrition Impaired glucose control: actual or potential Potential for alteratiion in Nutrition/Potential for imbalanced nutrition Goals: Patient/caregiver agrees to and verbalizes understanding of need to use nutritional supplements and/or vitamins as prescribed Date Initiated: 06/09/2015 Goal Status: Active Patient/caregiver verbalizes understanding of need to maintain therapeutic glucose control per primary care physician Date Initiated: 06/09/2015 Goal Status: Active Patient/caregiver will maintain therapeutic glucose control Date Initiated: 06/09/2015 Goal Status: Active Interventions: Assess patient nutrition upon admission and as needed per policy Gina [name], [name]. ([id number]) Notes: Orientation to the Wound Care Program Nursing Diagnoses: Knowledge deficit related to the wound healing center program Goals: Patient/caregiver will verbalize understanding of the Wound Healing Center Program Date  Initiated: 06/09/2015 Goal Status: Active Interventions: Provide education on orientation to the wound center Notes: Pain, Acute or Chronic Nursing Diagnoses: Pain, acute or chronic: actual or potential Potential alteration in comfort, pain Goals: Patient will verbalize adequate pain control and receive pain control interventions during procedures as needed Date Initiated: 06/09/2015 Goal Status: Active Interventions: Assess comfort goal upon admission Complete pain assessment as per visit requirements Notes: Pressure Nursing Diagnoses: Knowledge deficit related to causes and risk factors for pressure ulcer development Knowledge deficit related to management of pressures ulcers Goals: Patient will remain free from development of additional pressure ulcers Date Initiated: 06/09/2015 Goal Status: Active Interventions: Gina [name], [name]. ([id number]) Assess: immobility, friction, shearing, incontinence upon admission and as needed Assess offloading mechanisms upon admission and as needed Notes: Wound/Skin Impairment Nursing Diagnoses: Impaired tissue integrity Goals: Ulcer/skin breakdown will have a volume reduction of 30% by week 4 Date Initiated: 06/09/2015 Goal Status: Active Ulcer/skin breakdown will have a volume reduction of 50% by week 8 Date Initiated: 06/09/2015 Goal Status: Active Ulcer/skin breakdown will have a volume reduction of 80% by week 12 Date Initiated: 06/09/2015 Goal Status: Active Interventions: Assess ulceration(s) every visit Notes: Electronic Signature(s) Signed: 07/01/2015 5:29:01 PM By: Alejandro MullingPinkerton, Debra Entered By: Alejandro MullingPinkerton, Debra on 07/01/2015 08:56:20 Gina [name], Jhania H. ([id number]) -------------------------------------------------------------------------------- Pain Assessment Details Patient Name: Gina [name], Blu H. Date of Service: 07/01/2015 8:00 AM Medical Record Number: [id number] Patient Account Number: [id number] Date of Birth/Sex:  1917/04/11 (82 y.o. Female) Treating RN: Ashok CordiaPinkerton, Debi  Primary Care Physician: Einar CrowAnderson, Marshall Other Clinician: Referring Physician: Einar CrowAnderson, Marshall Treating Physician/Extender: Rudene ReBritto, Errol Weeks in Treatment: 3 Active Problems Location of Pain Severity and Description of Pain Patient Has Paino No Site Locations With Dressing Change: No Pain Management and Medication Current Pain Management: Electronic Signature(s) Signed: 07/01/2015 5:29:01 PM By: Alejandro MullingPinkerton, Debra Entered By: Alejandro MullingPinkerton, Debra on 07/01/2015 08:10:14 Gina [name], [name]. ([id number]) -------------------------------------------------------------------------------- Patient/Caregiver Education Details Patient Name: Gina [name], [name]. Date of Service: 07/01/2015 8:00 AM Medical Record Number: [id number] Patient Account Number: [id number] Date of Birth/Gender: 03-31-1917 (82 y.o. Female) Treating RN: Ashok CordiaPinkerton, Debi Primary Care Physician: Einar CrowAnderson, Marshall Other Clinician: Referring Physician: Einar CrowAnderson, Marshall Treating Physician/Extender: Rudene ReBritto, Errol Weeks in Treatment: 3 Education Assessment Education Provided To: Patient Education Topics Provided Wound/Skin Impairment: Handouts: Other: change dressing as ordered Methods: Demonstration, Explain/Verbal Responses: State content correctly Electronic Signature(s) Signed: 07/01/2015 5:29:01 PM By: Alejandro MullingPinkerton, Debra Entered By: Alejandro MullingPinkerton, Debra on 07/01/2015 08:43:53 Gina [name], [name]. ([id number]) -------------------------------------------------------------------------------- Wound Assessment Details Patient Name: Gina [name], Inella H. Date of Service: 07/01/2015 8:00 AM Medical Record Number: [id number] Patient Account Number: [id number] Date of Birth/Sex: 03-31-1917 (82 y.o. Female) Treating RN: Ashok CordiaPinkerton, Debi Primary Care Physician: Einar CrowAnderson, Marshall Other Clinician: Referring Physician: Einar CrowAnderson, Marshall Treating Physician/Extender:  Rudene ReBritto, Errol Weeks in Treatment: 3 Wound Status Wound Number: 6 Primary Diabetic Wound/Ulcer of the Lower Etiology: Extremity Wound Location: Right Calcaneus Secondary Pressure Ulcer Wounding Event: Pressure Injury Etiology: Date Acquired: 02/02/2015 Wound Open Weeks Of Treatment: 3 Status: Clustered Wound: No Comorbid Cataracts, Chronic sinus History: problems/congestion, Hypertension, Type II Diabetes, Osteoarthritis, Dementia, Seizure Disorder Photos Photo Uploaded By: Alejandro MullingPinkerton, Debra on 07/01/2015 17:44:26 Wound Measurements Length: (cm) 1.5 % Reduction in Width: (cm) 2.3 % Reduction in Depth: (cm) 0.3 Epithelializati Area: (cm) 2.71 Tunneling: Volume: (cm) 0.813 Undermining: Area: 40% Volume: 40% on: None No No Wound Description Classification: Grade 1 Wound Margin: Thickened Exudate Amount: Large Exudate Type: Serous Exudate Color: amber Foul Odor After Cleansing: No Wound Bed Granulation Amount: Small (1-33%) Exposed Structure Gina [name], Saman H. ([id number]) Granulation Quality: Red Fascia Exposed: No Necrotic Amount: Large (67-100%) Fat Layer Exposed: No Necrotic Quality: Adherent Slough Tendon Exposed: No Muscle Exposed: No Joint Exposed: No Bone Exposed: No Limited to Skin Breakdown Periwound Skin Texture Texture Color No Abnormalities Noted: Yes No Abnormalities Noted: No Atrophie Blanche: No Moisture Cyanosis: No No Abnormalities Noted: No Ecchymosis: No Dry / Scaly: No Erythema: No Maceration: No Hemosiderin Staining: No Moist: No Mottled: No Pallor: No Rubor: No Temperature / Pain Temperature: No Abnormality Tenderness on Palpation: Yes Wound Preparation Ulcer Cleansing: Rinsed/Irrigated with Saline Topical Anesthetic Applied: Other: lidocaine 4%, Treatment Notes Wound #6 (Right Calcaneus) 1. Cleansed with: Clean wound with Normal Saline 2. Anesthetic Topical Lidocaine 4% cream to wound bed prior to debridement 3.  Peri-wound Care: Barrier cream 4. Dressing Applied: Santyl Ointment 5. Secondary Dressing Applied ABD Pad Foam Gauze and Kerlix/Conform 7. Secured with Tape Notes netting; heel offloading boot Gina [name], Jaziya H. ([id number]) Electronic Signature(s) Signed: 07/01/2015 5:29:01 PM By: Alejandro MullingPinkerton, Debra Entered By: Alejandro MullingPinkerton, Debra on 07/01/2015 08:20:38 Gina [name], Moncerrat H. ([id number]) -------------------------------------------------------------------------------- Wound Assessment Details Patient Name: Gina [name], Raven H. Date of Service: 07/01/2015 8:00 AM Medical Record Number: [id number] Patient Account Number: [id number] Date of Birth/Sex: 03-31-1917 (82 y.o. Female) Treating RN: Phillis HaggisPinkerton, Debi Primary Care Physician: Einar CrowAnderson, Marshall Other Clinician: Referring Physician: Einar CrowAnderson, Marshall Treating Physician/Extender: Rudene ReBritto, Errol Weeks in Treatment: 3 Wound Status Wound Number: 7 Primary Etiology: Skin Tear Wound Location: Right, Distal, Anterior Forearm Wound Status: Healed - Epithelialized  Wounding Event: Gradually Appeared Date Acquired: 06/23/2015 Weeks Of Treatment: 1 Clustered Wound: No Photos Photo Uploaded By: Alejandro Mulling on 07/01/2015 17:44:26 Wound Measurements Length: (cm) 0 % Reduction Width: (cm) 0 % Reduction Depth: (cm) 0 Area: (cm) 0 Volume: (cm) 0 in Area: 100% in Volume: 100% Wound Description Classification: Partial Thickness Periwound Skin Texture Texture Color No Abnormalities Noted: No No Abnormalities Noted: No Moisture No Abnormalities Noted: No Electronic Signature(s) Signed: 07/01/2015 5:29:01 PM By: Elmore Guise, Everardo Beals ([id number]) Entered By: Alejandro Mulling on 07/01/2015 09:03:38 Gina [name] ([id number]) -------------------------------------------------------------------------------- Wound Assessment Details Patient Name: Gina [name] Date of Service: 07/01/2015 8:00 AM Medical  Record Number: [id number] Patient Account Number: [id number] Date of Birth/Sex: [birth year] (82 y.o. Female) Treating RN: Ashok Cordia, Debi Primary Care Physician: Einar Crow Other Clinician: Referring Physician: Einar Crow Treating Physician/Extender: Rudene Re in Treatment: 3 Wound Status Wound Number: 8 Primary Etiology: Skin Tear Wound Location: Right, Proximal, Anterior Wound Status: Healed - Epithelialized Forearm Wounding Event: Gradually Appeared Date Acquired: 06/23/2015 Weeks Of Treatment: 1 Clustered Wound: No Photos Photo Uploaded By: Alejandro Mulling on 07/01/2015 17:44:42 Wound Measurements Length: (cm) 0 % Reduction Width: (cm) 0 % Reduction Depth: (cm) 0 Area: (cm) 0 Volume: (cm) 0 in Area: 100% in Volume: 100% Wound Description Classification: Partial Thickness Periwound Skin Texture Texture Color No Abnormalities Noted: No No Abnormalities Noted: No Moisture No Abnormalities Noted: No Electronic Signature(s) Signed: 07/01/2015 5:29:01 PM By: Elmore Guise, Everardo Beals ([id number]) Entered By: Alejandro Mulling on 07/01/2015 09:03:45 Gina [name] ([id number]) -------------------------------------------------------------------------------- Vitals Details Patient Name: Gina [name] Date of Service: 07/01/2015 8:00 AM Medical Record Number: [id number] Patient Account Number: [id number] Date of Birth/Sex: 1917-01-26 (82 y.o. Female) Treating RN: Ashok Cordia, Debi Primary Care Physician: Einar Crow Other Clinician: Referring Physician: Einar Crow Treating Physician/Extender: Rudene Re in Treatment: 3 Vital Signs Time Taken: 08:25 Temperature (F): 97.6 Height (in): 63 Pulse (bpm): 86 Weight (lbs): 142 Respiratory Rate (breaths/min): 16 Body Mass Index (BMI): 25.2 Blood Pressure (mmHg): 166/70 Reference Range: 80 - 120 mg / dl Electronic Signature(s) Signed: 07/01/2015  5:29:01 PM By: Alejandro Mulling Entered By: Alejandro Mulling on 07/01/2015 08:26:07

## 2015-07-08 ENCOUNTER — Encounter: Payer: PPO | Attending: Surgery | Admitting: Surgery

## 2015-07-08 DIAGNOSIS — F0390 Unspecified dementia without behavioral disturbance: Secondary | ICD-10-CM | POA: Insufficient documentation

## 2015-07-08 DIAGNOSIS — M810 Age-related osteoporosis without current pathological fracture: Secondary | ICD-10-CM | POA: Insufficient documentation

## 2015-07-08 DIAGNOSIS — N189 Chronic kidney disease, unspecified: Secondary | ICD-10-CM | POA: Insufficient documentation

## 2015-07-08 DIAGNOSIS — K219 Gastro-esophageal reflux disease without esophagitis: Secondary | ICD-10-CM | POA: Diagnosis not present

## 2015-07-08 DIAGNOSIS — E1122 Type 2 diabetes mellitus with diabetic chronic kidney disease: Secondary | ICD-10-CM | POA: Diagnosis not present

## 2015-07-08 DIAGNOSIS — L89613 Pressure ulcer of right heel, stage 3: Secondary | ICD-10-CM | POA: Diagnosis not present

## 2015-07-08 DIAGNOSIS — E11621 Type 2 diabetes mellitus with foot ulcer: Secondary | ICD-10-CM | POA: Diagnosis not present

## 2015-07-08 DIAGNOSIS — E039 Hypothyroidism, unspecified: Secondary | ICD-10-CM | POA: Diagnosis not present

## 2015-07-08 DIAGNOSIS — I129 Hypertensive chronic kidney disease with stage 1 through stage 4 chronic kidney disease, or unspecified chronic kidney disease: Secondary | ICD-10-CM | POA: Insufficient documentation

## 2015-07-09 NOTE — Progress Notes (Signed)
ADONICA, FUKUSHIMA ([id number]) Visit Report for 07/08/2015 Chief Complaint Document Details Patient Name: Gina [name], Gina [name]. Date of Service: 07/08/2015 8:00 AM Medical Record Number: [id number] Patient Account Number: [id number] Date of Birth/Sex: 12/26/1917 (82 y.o. Female) Treating RN: Phillis Haggis Primary Care Physician: Einar Crow Other Clinician: Referring Physician: Einar Crow Treating Physician/Extender: Rudene Re in Treatment: 4 Information Obtained from: Patient Chief Complaint Patient is at the clinic for treatment of an open pressure ulcer to the right heel which she's had for about 4 months Electronic Signature(s) Signed: 07/08/2015 8:36:22 AM By: Evlyn Kanner MD, FACS Entered By: Evlyn Kanner on 07/08/2015 08:36:22 Gina [name] ([id number]) -------------------------------------------------------------------------------- Debridement Details Patient Name: Gina [name] Date of Service: 07/08/2015 8:00 AM Medical Record Number: [id number] Patient Account Number: [id number] Date of Birth/Sex: [birth year] (82 y.o. Female) Treating RN: Ashok Cordia, Debi Primary Care Physician: Einar Crow Other Clinician: Referring Physician: Einar Crow Treating Physician/Extender: Rudene Re in Treatment: 4 Debridement Performed for Wound #6 Right Calcaneus Assessment: Performed By: Physician Evlyn Kanner, MD Debridement: Debridement Pre-procedure Yes Verification/Time Out Taken: Start Time: 08:31 Pain Control: Other : lidocaine 4% cream Level: Skin/Subcutaneous Tissue Total Area Debrided (L x 1.1 (cm) x 2.2 (cm) = 2.42 (cm) W): Tissue and other Viable, Non-Viable, Exudate, Fibrin/Slough, Subcutaneous material debrided: Instrument: Curette Bleeding: Minimum Hemostasis Achieved: Pressure End Time: 08:32 Procedural Pain: 0 Post Procedural Pain: 0 Response to Treatment: Procedure was tolerated well Post  Debridement Measurements of Total Wound Length: (cm) 1.1 Width: (cm) 2.2 Depth: (cm) 0.3 Volume: (cm) 0.57 Post Procedure Diagnosis Same as Pre-procedure Electronic Signature(s) Signed: 07/08/2015 8:36:14 AM By: Evlyn Kanner MD, FACS Signed: 07/08/2015 4:27:53 PM By: Alejandro Mulling Entered By: Evlyn Kanner on 07/08/2015 08:36:14 Gina [name] ([id number]) -------------------------------------------------------------------------------- HPI Details Patient Name: Gina [name] Date of Service: 07/08/2015 8:00 AM Medical Record Number: [id number] Patient Account Number: [id number] Date of Birth/Sex: 1917-05-13 (82 y.o. Female) Treating RN: Phillis Haggis Primary Care Physician: Einar Crow Other Clinician: Referring Physician: Einar Crow Treating Physician/Extender: Rudene Re in Treatment: 4 History of Present Illness Location: injury to the right heel with drainage Quality: Patient reports No Pain. Severity: Patient states wound are getting worse. Duration: Patient has had the wound for > 4 months prior to seeking treatment at the wound center Timing: Pain in wound is Intermittent (comes and goes Context: The wound appeared gradually over time Modifying Factors: Other treatment(s) tried include:was seen by her PCP and put on Keflex recently but the dose is complete Associated Signs and Symptoms: Patient reports having foul odor. HPI Description: 82 year old patient who is a resident of a nursing home who comes to see as with a decubitus ulcer of the right heel. He has a past medical history of diabetes mellitus, hypercholesterolemia, hypothyroidism, GERD, hypertension, dementia, chronic kidney disease and age-related osteoporosis. She is status post cholecystectomy, hysterectomy, back fusion, debridement of necrotic ulcer right thigh in February 2017. She has never been a smoker. He has been seen at our wound center in the past and was treated  in 2015. She was recently seen by her PCP Dr. Einar Crow, for a heel ulcer and he had recommend Keflex and local dressing. 06/17/2015 -- grew Escherichia coli which was treated by Dr. Leanord Hawking with Keflex 500 mg by mouth 3 times a day for 7 days. Culture results were noted -- wound culture grew Escherichia coli sensitive to Bactrim, ampicillin, ampicillin sulbactam, Kefzol and, cefepime. 06/24/2015 -- x-ray of the right foot was done and this  showed no evidence of osteomyelitis. Electronic Signature(s) Signed: 07/08/2015 8:36:26 AM By: Evlyn Kanner MD, FACS Entered By: Evlyn Kanner on 07/08/2015 08:36:26 Gina [name] ([id number]) -------------------------------------------------------------------------------- Physical Exam Details Patient Name: Gina [name] Date of Service: 07/08/2015 8:00 AM Medical Record Number: [id number] Patient Account Number: [id number] Date of Birth/Sex: 03/02/1917 (82 y.o. Female) Treating RN: Phillis Haggis Primary Care Physician: Einar Crow Other Clinician: Referring Physician: Einar Crow Treating Physician/Extender: Rudene Re in Treatment: 4 Constitutional . Pulse regular. Respirations normal and unlabored. Afebrile. . Eyes Nonicteric. Reactive to light. Ears, Nose, Mouth, and Throat Lips, teeth, and gums WNL.Marland Kitchen Moist mucosa without lesions. Neck supple and nontender. No palpable supraclavicular or cervical adenopathy. Normal sized without goiter. Respiratory WNL. No retractions.. Breath sounds WNL, No rubs, rales, rhonchi, or wheeze.. Cardiovascular Heart rhythm and rate regular, no murmur or gallop.. Pedal Pulses WNL. No clubbing, cyanosis or edema. Lymphatic No adneopathy. No adenopathy. No adenopathy. Musculoskeletal Adexa without tenderness or enlargement.. Digits and nails w/o clubbing, cyanosis, infection, petechiae, ischemia, or inflammatory conditions.. Integumentary (Hair, Skin) No suspicious  lesions. No crepitus or fluctuance. No peri-wound warmth or erythema. No masses.Marland Kitchen Psychiatric Judgement and insight Intact.. No evidence of depression, anxiety, or agitation.. Notes the wound looks very good and using a curette I have removed some of subcutaneous debris in the rest of it has healthy granulation tissue. Does not probe down to bone. Electronic Signature(s) Signed: 07/08/2015 8:36:51 AM By: Evlyn Kanner MD, FACS Entered By: Evlyn Kanner on 07/08/2015 08:36:51 Gina [name] ([id number]) -------------------------------------------------------------------------------- Physician Orders Details Patient Name: Gina [name] Date of Service: 07/08/2015 8:00 AM Medical Record Number: [id number] Patient Account Number: [id number] Date of Birth/Sex: May 20, 1917 (82 y.o. Female) Treating RN: Ashok Cordia, Debi Primary Care Physician: Einar Crow Other Clinician: Referring Physician: Einar Crow Treating Physician/Extender: Rudene Re in Treatment: 4 Verbal / Phone Orders: Yes Clinician: Ashok Cordia, Debi Read Back and Verified: Yes Diagnosis Coding Wound Cleansing Wound #6 Right Calcaneus o Clean wound with Normal Saline. o Cleanse wound with mild soap and water Anesthetic Wound #6 Right Calcaneus o Topical Lidocaine 4% cream applied to wound bed prior to debridement - for clinic use Skin Barriers/Peri-Wound Care Wound #6 Right Calcaneus o Barrier cream - zinc Primary Wound Dressing Wound #6 Right Calcaneus o Iodoflex Secondary Dressing Wound #6 Right Calcaneus o Gauze, ABD and Kerlix/Conform - netting, tape o Foam Dressing Change Frequency Wound #6 Right Calcaneus o Change dressing every day. Follow-up Appointments Wound #6 Right Calcaneus o Return Appointment in 1 week. Edema Control Wound #6 Right Calcaneus o Elevate legs to the level of the heart and pump ankles as often as possible ONYA, EUTSLER.  ([id number]) Off-Loading Wound #6 Right Calcaneus o Turn and reposition every 2 hours o Other: - sage boots float heels Additional Orders / Instructions Wound #6 Right Calcaneus o Increase protein intake. Medications-please add to medication list. Wound #6 Right Calcaneus o Other: - Vitamin C, Vitamin A, Zinc, Multivitamin Electronic Signature(s) Signed: 07/08/2015 3:36:55 PM By: Evlyn Kanner MD, FACS Signed: 07/08/2015 4:27:53 PM By: Alejandro Mulling Entered By: Alejandro Mulling on 07/08/2015 08:34:55 Gina [name] ([id number]) -------------------------------------------------------------------------------- Problem List Details Patient Name: Gina [name] Date of Service: 07/08/2015 8:00 AM Medical Record Number: [id number] Patient Account Number: [id number] Date of Birth/Sex: 1917/08/08 (82 y.o. Female) Treating RN: Phillis Haggis Primary Care Physician: Einar Crow Other Clinician: Referring Physician: Einar Crow Treating Physician/Extender: Rudene Re in Treatment: 4 Active Problems ICD-10 Encounter Code Description Active Date Diagnosis  I95.18811.621 Type 2 diabetes mellitus with foot ulcer 06/09/2015 Yes L89.613 Pressure ulcer of right heel, stage 3 06/09/2015 Yes F03.90 Unspecified dementia without behavioral disturbance 06/09/2015 Yes Inactive Problems Resolved Problems Electronic Signature(s) Signed: 07/08/2015 8:36:05 AM By: Evlyn KannerBritto, Karesa Maultsby MD, FACS Entered By: Evlyn KannerBritto, Lissandro Dilorenzo on 07/08/2015 08:36:05 Gina BossWILLIAMS, Jermiya H. ([id number]) -------------------------------------------------------------------------------- Progress Note Details Patient Name: Gina BossWILLIAMS, Rosalita H. Date of Service: 07/08/2015 8:00 AM Medical Record Number: [id number] Patient Account Number: [id number] Date of Birth/Sex: December 07, 1917 (82 y.o. Female) Treating RN: Ashok CordiaPinkerton, Debi Primary Care Physician: Einar CrowAnderson, Marshall Other Clinician: Referring Physician: Einar CrowAnderson,  Marshall Treating Physician/Extender: Rudene ReBritto, Levoy Geisen Weeks in Treatment: 4 Subjective Chief Complaint Information obtained from Patient Patient is at the clinic for treatment of an open pressure ulcer to the right heel which she's had for about 4 months History of Present Illness (HPI) The following HPI elements were documented for the patient's wound: Location: injury to the right heel with drainage Quality: Patient reports No Pain. Severity: Patient states wound are getting worse. Duration: Patient has had the wound for > 4 months prior to seeking treatment at the wound center Timing: Pain in wound is Intermittent (comes and goes Context: The wound appeared gradually over time Modifying Factors: Other treatment(s) tried include:was seen by her PCP and put on Keflex recently but the dose is complete Associated Signs and Symptoms: Patient reports having foul odor. 82 year old patient who is a resident of a nursing home who comes to see as with a decubitus ulcer of the right heel. He has a past medical history of diabetes mellitus, hypercholesterolemia, hypothyroidism, GERD, hypertension, dementia, chronic kidney disease and age-related osteoporosis. She is status post cholecystectomy, hysterectomy, back fusion, debridement of necrotic ulcer right thigh in February 2017. She has never been a smoker. He has been seen at our wound center in the past and was treated in 2015. She was recently seen by her PCP Dr. Einar CrowMarshall Anderson, for a heel ulcer and he had recommend Keflex and local dressing. 06/17/2015 -- grew Escherichia coli which was treated by Dr. Leanord Hawkingobson with Keflex 500 mg by mouth 3 times a day for 7 days. Culture results were noted -- wound culture grew Escherichia coli sensitive to Bactrim, ampicillin, ampicillin sulbactam, Kefzol and, cefepime. 06/24/2015 -- x-ray of the right foot was done and this showed no evidence of osteomyelitis. Objective Gina BossWILLIAMS, Arena H.  ([id number]) Constitutional Pulse regular. Respirations normal and unlabored. Afebrile. Vitals Time Taken: 8:21 AM, Height: 63 in, Weight: 142 lbs, BMI: 25.2, Temperature: 98.1 F, Pulse: 81 bpm, Respiratory Rate: 16 breaths/min, Blood Pressure: 140/61 mmHg. Eyes Nonicteric. Reactive to light. Ears, Nose, Mouth, and Throat Lips, teeth, and gums WNL.Marland Kitchen. Moist mucosa without lesions. Neck supple and nontender. No palpable supraclavicular or cervical adenopathy. Normal sized without goiter. Respiratory WNL. No retractions.. Breath sounds WNL, No rubs, rales, rhonchi, or wheeze.. Cardiovascular Heart rhythm and rate regular, no murmur or gallop.. Pedal Pulses WNL. No clubbing, cyanosis or edema. Lymphatic No adneopathy. No adenopathy. No adenopathy. Musculoskeletal Adexa without tenderness or enlargement.. Digits and nails w/o clubbing, cyanosis, infection, petechiae, ischemia, or inflammatory conditions.Marland Kitchen. Psychiatric Judgement and insight Intact.. No evidence of depression, anxiety, or agitation.. General Notes: the wound looks very good and using a curette I have removed some of subcutaneous debris in the rest of it has healthy granulation tissue. Does not probe down to bone. Integumentary (Hair, Skin) No suspicious lesions. No crepitus or fluctuance. No peri-wound warmth or erythema. No masses.. Wound #6 status is Open. Original cause of wound  was Pressure Injury. The wound is located on the Right Calcaneus. The wound measures 1.1cm length x 2.2cm width x 0.3cm depth; 1.901cm^2 area and 0.57cm^3 volume. There is no tunneling or undermining noted. There is a large amount of serous drainage noted. The wound margin is thickened. There is small (1-33%) red granulation within the wound bed. There is a large (67-100%) amount of necrotic tissue within the wound bed including Adherent Slough. The periwound skin appearance had no abnormalities noted for texture. The periwound skin appearance did  not exhibit: Dry/Scaly, Maceration, Moist, Atrophie Blanche, Cyanosis, Ecchymosis, Hemosiderin Staining, Mottled, Pallor, Rubor, Erythema. Periwound temperature was noted as No Abnormality. The periwound has tenderness on palpation. NYEMAH, WATTON ([id number]) Assessment Active Problems ICD-10 E11.621 - Type 2 diabetes mellitus with foot ulcer L89.613 - Pressure ulcer of right heel, stage 3 F03.90 - Unspecified dementia without behavioral disturbance The wound is nice and clean and has healthy granulation tissue and this stage I will change over to iodoflex packing on a daily basis with appropriate heel protection. Her daughter is at the bedside also will make sure that she has adequate protein intake, vitamin E, vitamin C and zinc. He will come back and see as next week. Procedures Wound #6 Wound #6 is a Diabetic Wound/Ulcer of the Lower Extremity located on the Right Calcaneus . There was a Skin/Subcutaneous Tissue Debridement ([id number]) debridement with total area of 2.42 sq cm performed by Evlyn Kanner, MD. with the following instrument(s): Curette to remove Viable and Non-Viable tissue/material including Exudate, Fibrin/Slough, and Subcutaneous after achieving pain control using Other (lidocaine 4% cream). A time out was conducted prior to the start of the procedure. A Minimum amount of bleeding was controlled with Pressure. The procedure was tolerated well with a pain level of 0 throughout and a pain level of 0 following the procedure. Post Debridement Measurements: 1.1cm length x 2.2cm width x 0.3cm depth; 0.57cm^3 volume. Post procedure Diagnosis Wound #6: Same as Pre-Procedure Plan Wound Cleansing: Wound #6 Right Calcaneus: Clean wound with Normal Saline. Cleanse wound with mild soap and water Anesthetic: Wound #6 Right Calcaneus: Topical Lidocaine 4% cream applied to wound bed prior to debridement - for clinic use TACARA, HADLOCK ([id number]) Skin  Barriers/Peri-Wound Care: Wound #6 Right Calcaneus: Barrier cream - zinc Primary Wound Dressing: Wound #6 Right Calcaneus: Iodoflex Secondary Dressing: Wound #6 Right Calcaneus: Gauze, ABD and Kerlix/Conform - netting, tape Foam Dressing Change Frequency: Wound #6 Right Calcaneus: Change dressing every day. Follow-up Appointments: Wound #6 Right Calcaneus: Return Appointment in 1 week. Edema Control: Wound #6 Right Calcaneus: Elevate legs to the level of the heart and pump ankles as often as possible Off-Loading: Wound #6 Right Calcaneus: Turn and reposition every 2 hours Other: - sage boots float heels Additional Orders / Instructions: Wound #6 Right Calcaneus: Increase protein intake. Medications-please add to medication list.: Wound #6 Right Calcaneus: Other: - Vitamin C, Vitamin A, Zinc, Multivitamin The wound is nice and clean and has healthy granulation tissue and this stage I will change over to iodoflex packing on a daily basis with appropriate heel protection. Her daughter is at the bedside also will make sure that she has adequate protein intake, vitamin E, vitamin C and zinc. He will come back and see as next week. Electronic Signature(s) Signed: 07/08/2015 8:38:43 AM By: Evlyn Kanner MD, FACS Entered By: Evlyn Kanner on 07/08/2015 08:38:43 Gina [name] ([id number]) -------------------------------------------------------------------------------- SuperBill Details Patient Name: Gina [name] Date of Service: 07/08/2015 Medical Record Number:  [id number] Patient Account Number: [id number] Date of Birth/Sex: November 07, 1917 (82 y.o. Female) Treating RN: Phillis Haggis Primary Care Physician: Einar Crow Other Clinician: Referring Physician: Einar Crow Treating Physician/Extender: Rudene Re in Treatment: 4 Diagnosis Coding ICD-10 Codes Code Description E11.621 Type 2 diabetes mellitus with foot ulcer L89.613 Pressure ulcer of  right heel, stage 3 F03.90 Unspecified dementia without behavioral disturbance Facility Procedures CPT4 Code: [id number] Description: 11042 - DEB SUBQ TISSUE 20 SQ CM/< ICD-10 Description Diagnosis E11.621 Type 2 diabetes mellitus with foot ulcer L89.613 Pressure ulcer of right heel, stage 3 F03.90 Unspecified dementia without behavioral disturb Modifier: ance Quantity: 1 Physician Procedures CPT4 Code: [id number] Description: 11042 - WC PHYS SUBQ TISS 20 SQ CM ICD-10 Description Diagnosis E11.621 Type 2 diabetes mellitus with foot ulcer L89.613 Pressure ulcer of right heel, stage 3 F03.90 Unspecified dementia without behavioral disturba Modifier: nce Quantity: 1 Electronic Signature(s) Signed: 07/08/2015 8:38:54 AM By: Evlyn Kanner MD, FACS Entered By: Evlyn Kanner on 07/08/2015 08:38:53

## 2015-07-09 NOTE — Progress Notes (Signed)
Gina, [name] ([id number]) Visit Report for 07/08/2015 Arrival Information Details Patient Name: Gina [name], Gina [name]. Date of Service: 07/08/2015 8:00 AM Medical Record Number: [id number] Patient Account Number: [id number] Date of Birth/Sex: [birth year] (82 y.o. Female) Treating RN: Gina [name], Gina [name] Primary Care Physician: Gina [name] Other Clinician: Referring Physician: Einar [name] Treating Physician/Extender: Gina [name] in Treatment: 4 Visit Information History Since Last Visit All ordered tests and consults were completed: No Patient Arrived: Wheel Chair Added or deleted any medications: No Arrival Time: 08:13 Any new allergies or adverse reactions: No Accompanied By: daughter Had a fall or experienced change in No Transfer Assistance: Easy[employer] Patient activities of daily living that may affect Lift risk of falls: Patient Identification Verified: Yes Signs or symptoms of abuse/neglect since last No Secondary Verification Yes visito Process Completed: Hospitalized since last visit: No Patient Requires No Pain Present Now: No Transmission-Based Precautions: Patient Has Alerts: Yes Patient Alerts: DM II ABI right noncompressible Electronic Signature(s) Signed: 07/08/2015 4:27:53 PM By: Gina [name] Entered By: Gina [name] on 07/08/2015 08:21:06 Gina [name] ([id number]) -------------------------------------------------------------------------------- Encounter Discharge Information Details Patient Name: Gina [name] Date of Service: 07/08/2015 8:00 AM Medical Record Number: [id number] Patient Account Number: [id number] Date of Birth/Sex: February 11, 1917 (82 y.o. Female) Treating RN: Gina [name], Gina [name] Primary Care Physician: Gina [name] Other Clinician: Referring Physician: Einar [name] Treating Physician/Extender: Gina [name] in Treatment: 4 Encounter Discharge Information Items Discharge Pain Level:  0 Discharge Condition: Stable Ambulatory Status: Wheelchair Discharge Destination: Nursing Home Transportation: Private Auto Accompanied By: daughter Schedule Follow-up Appointment: Yes Medication Reconciliation completed Yes and provided to Patient/Care Gina [name]: Provided on Clinical Summary of Care: 07/08/2015 Form Type Recipient Paper Patient LW Electronic Signature(s) Signed: 07/08/2015 8:50:00 AM By: Gina [name] Entered By: Gina [name] on 07/08/2015 08:50:00 Gina [name] ([id number]) -------------------------------------------------------------------------------- Lower Extremity Assessment Details Patient Name: Gina [name] Date of Service: 07/08/2015 8:00 AM Medical Record Number: [id number] Patient Account Number: [id number] Date of Birth/Sex: 05-04-1917 (82 y.o. Female) Treating RN: Gina [name], Gina [name] Primary Care Physician: Gina [name] Other Clinician: Referring Physician: Einar [name] Treating Physician/Extender: Gina [name] in Treatment: 4 Vascular Assessment Pulses: Posterior Tibial Dorsalis Pedis Palpable: [Right:Yes] Extremity colors, hair growth, and conditions: Extremity Color: [Right:Normal] Temperature of Extremity: [Right:Warm] Capillary Refill: [Right:< 3 seconds] Toe Nail Assessment Left: Right: Thick: Yes Discolored: Yes Deformed: Yes Improper Length and Hygiene: No Electronic Signature(s) Signed: 07/08/2015 4:27:53 PM By: Gina [name] Entered By: Gina [name] on 07/08/2015 08:23:11 Gina [name] ([id number]) -------------------------------------------------------------------------------- Multi Wound Chart Details Patient Name: Gina [name] Date of Service: 07/08/2015 8:00 AM Medical Record Number: [id number] Patient Account Number: [id number] Date of Birth/Sex: [birth year] (82 y.o. 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] [N/A:N/A] Secondary Etiology: [6:Pressure Ulcer] [N/A:N/A] Comorbid History: [6:Cataracts, Chronic sinus problems/congestion, Hypertension, Type II Diabetes, Osteoarthritis, Dementia, Seizure Disorder] [N/A:N/A] Date Acquired: [6:02/02/2015] [N/A:N/A] Weeks of Treatment: [6:4] [N/A:N/A] Wound Status: [6:Open] [N/A:N/A] Measurements L x W x D 1.1x2.2x0.3 [N/A:N/A] (cm) Area (cm) : [6:1.901] [N/A:N/A] Volume (cm) : [6:0.57] [N/A:N/A] % Reduction in Area: [6:57.90%] [N/A:N/A] % Reduction in Volume: 57.90% [N/A:N/A] Classification: [6:Grade 1] [N/A:N/A] Exudate Amount: [6:Large] [N/A:N/A] Exudate Type: [6:Serous] [  N/A:N/A] Exudate Color: [6:amber] [N/A:N/A] Wound Margin: [6:Thickened] [N/A:N/A] Granulation Amount: [6:Small (1-33%)] [N/A:N/A] Granulation Quality: [6:Red] [N/A:N/A] Necrotic Amount: [6:Large (67-100%)] [N/A:N/A] Exposed Structures: [N/A:N/A] Fascia: No Fat: No Tendon: No Muscle: No Joint: No Bone: No Epithelialization: None N/A N/A Periwound Skin Texture: No Abnormalities Noted N/A N/A Periwound Skin Maceration: No N/A N/A Moisture: Moist: No Dry/Scaly: No Periwound Skin Color: Atrophie Blanche: No N/A N/A Cyanosis: No Ecchymosis: No Erythema: No Hemosiderin Staining: No Mottled: No Pallor: No Rubor: No Temperature: No Abnormality N/A N/A Tenderness on Yes N/A N/A Palpation: Wound Preparation: Ulcer Cleansing: N/A N/A Rinsed/Irrigated with Saline Topical Anesthetic Applied: Other:  lidocaine 4% Treatment Notes Electronic Signature(s) Signed: 07/08/2015 4:27:53 PM By: Gina MullingPinkerton, Debra Entered By: Gina MullingPinkerton, Debra on 07/08/2015 08:30:03 Gina [name], [name]. ([id number]) -------------------------------------------------------------------------------- Multi-Disciplinary Care Plan Details Patient Name: Gina [name], [name]. Date of Service: 07/08/2015 8:00 AM Medical Record Number: [id number] Patient Account Number: [id number] Date of Birth/Sex: 03/23/1917 (82 y.o. Female) Treating RN: Gina CordiaPinkerton, Gina [name] Primary Care Physician: Gina CrowAnderson, Marshall Other Clinician: Referring Physician: Einar CrowAnderson, Marshall Treating Physician/Extender: Gina ReBritto, Errol Weeks in Treatment: 4 Active Inactive Abuse / Safety / Falls / Self Care Management Nursing Diagnoses: Potential for falls Goals: Patient will remain injury free Date Initiated: 06/09/2015 Goal Status: Active Interventions: Assess fall risk on admission and as needed Notes: Nutrition Nursing Diagnoses: Imbalanced nutrition Impaired glucose control: actual or potential Potential for alteratiion in Nutrition/Potential for imbalanced nutrition Goals: Patient/caregiver agrees to and verbalizes understanding of need to use nutritional supplements and/or vitamins as prescribed Date Initiated: 06/09/2015 Goal Status: Active Patient/caregiver verbalizes understanding of need to maintain therapeutic glucose control per primary care physician Date Initiated: 06/09/2015 Goal Status: Active Patient/caregiver will maintain therapeutic glucose control Date Initiated: 06/09/2015 Goal Status: Active Interventions: Assess patient nutrition upon admission and as needed per policy Gina [name], [name]. ([id number]) Notes: Orientation to the Wound Care Program Nursing Diagnoses: Knowledge deficit related to the wound healing center program Goals: Patient/caregiver will verbalize understanding of the Wound Healing Center Program Date  Initiated: 06/09/2015 Goal Status: Active Interventions: Provide education on orientation to the wound center Notes: Pain, Acute or Chronic Nursing Diagnoses: Pain, acute or chronic: actual or potential Potential alteration in comfort, pain Goals: Patient will verbalize adequate pain control and receive pain control interventions during procedures as needed Date Initiated: 06/09/2015 Goal Status: Active Interventions: Assess comfort goal upon admission Complete pain assessment as per visit requirements Notes: Pressure Nursing Diagnoses: Knowledge deficit related to causes and risk factors for pressure ulcer development Knowledge deficit related to management of pressures ulcers Goals: Patient will remain free from development of additional pressure ulcers Date Initiated: 06/09/2015 Goal Status: Active Interventions: Gina [name], [name]. ([id number]) Assess: immobility, friction, shearing, incontinence upon admission and as needed Assess offloading mechanisms upon admission and as needed Notes: Wound/Skin Impairment Nursing Diagnoses: Impaired tissue integrity Goals: Ulcer/skin breakdown will have a volume reduction of 30% by week 4 Date Initiated: 06/09/2015 Goal Status: Active Ulcer/skin breakdown will have a volume reduction of 50% by week 8 Date Initiated: 06/09/2015 Goal Status: Active Ulcer/skin breakdown will have a volume reduction of 80% by week 12 Date Initiated: 06/09/2015 Goal Status: Active Interventions: Assess ulceration(s) every visit Notes: Electronic Signature(s) Signed: 07/08/2015 4:27:53 PM By: Gina MullingPinkerton, Debra Entered By: Gina MullingPinkerton, Debra on 07/08/2015 08:29:49 Gina [name], [name]. ([id number]) -------------------------------------------------------------------------------- Pain Assessment Details Patient Name: Gina [name], [name]. Date of Service: 07/08/2015 8:00 AM Medical Record Number: [id number] Patient Account Number: [id number] Date of Birth/Sex:  03/23/1917 (82 y.o. Female) Treating RN: Gina CordiaPinkerton,  Gina [name] Primary Care Physician: Gina [name] Other Clinician: Referring Physician: Einar [name] Treating Physician/Extender: Gina [name] in Treatment: 4 Active Problems Location of Pain Severity and Description of Pain Patient Has Paino No Site Locations With Dressing Change: No Pain Management and Medication Current Pain Management: Electronic Signature(s) Signed: 07/08/2015 4:27:53 PM By: Gina [name] Entered By: Gina [name] on 07/08/2015 08:21:13 Gina [name] ([id number]) -------------------------------------------------------------------------------- Patient/Caregiver Education Details Patient Name: Gina [name] Date of Service: 07/08/2015 8:00 AM Medical Record Number: [id number] Patient Account Number: [id number] Date of Birth/Gender: [birth year] (82 y.o. Female) Treating RN: Gina [name], Gina [name] Primary Care Physician: Gina [name] Other Clinician: Referring Physician: Einar [name] Treating Physician/Extender: Gina [name] in Treatment: 4 Education Assessment Education Provided To: Patient Education Topics Provided Wound/Skin Impairment: Handouts: Other: change dressing as ordered Methods: Demonstration, Explain/Verbal Responses: State content correctly Electronic Signature(s) Signed: 07/08/2015 4:27:53 PM By: Gina [name] Entered By: Gina [name] on 07/08/2015 08:35:58 Gina [name] ([id number]) -------------------------------------------------------------------------------- Wound Assessment Details Patient Name: Gina [name] Date of Service: 07/08/2015 8:00 AM Medical Record Number: [id number] Patient Account Number: [id number] Date of Birth/Sex: April 07, 1917 (82 y.o. Female) Treating RN: Gina [name], Gina [name] Primary Care Physician: Gina [name] Other Clinician: Referring Physician: Einar [name] Treating Physician/Extender: Gina [name] in Treatment: 4 Wound Status Wound Number: 6 Primary Diabetic Wound/Ulcer of the Lower Etiology: Extremity Wound Location: Right Calcaneus Secondary Pressure Ulcer Wounding Event: Pressure Injury Etiology: Date Acquired: 02/02/2015 Wound Open Weeks Of Treatment: 4 Status: Clustered Wound: No Comorbid Cataracts, Chronic sinus History: problems/congestion, Hypertension, Type II Diabetes, Osteoarthritis, Dementia, Seizure Disorder Photos Photo Uploaded By: Gina [name] on 07/08/2015 11:52:26 Wound Measurements Length: (cm) 1.1 % Reduction in Width: (cm) 2.2 % Reduction in Depth: (cm) 0.3 Epithelializati Area: (cm) 1.901 Tunneling: Volume: (cm) 0.57 Undermining: Area: 57.9% Volume: 57.9% on: None No No Wound Description Classification: Grade 1 Wound Margin: Thickened Exudate Amount: Large Exudate Type: Serous Exudate Color: amber Foul Odor After Cleansing: No Wound Bed Granulation Amount: Small (1-33%) Exposed Structure OMEKA, HOLBEN ([id number]) Granulation Quality: Red Fascia Exposed: No Necrotic Amount: Large (67-100%) Fat Layer Exposed: No Necrotic Quality: Adherent Slough Tendon Exposed: No Muscle Exposed: No Joint Exposed: No Bone Exposed: No Periwound Skin Texture Texture Color No Abnormalities Noted: Yes No Abnormalities Noted: No Atrophie Blanche: No Moisture Cyanosis: No No Abnormalities Noted: No Ecchymosis: No Dry / Scaly: No Erythema: No Maceration: No Hemosiderin Staining: No Moist: No Mottled: No Pallor: No Rubor: No Temperature / Pain Temperature: No Abnormality Tenderness on Palpation: Yes Wound Preparation Ulcer Cleansing: Rinsed/Irrigated with Saline Topical Anesthetic Applied: Other: lidocaine 4%, Treatment Notes Wound #6 (Right Calcaneus) 1. Cleansed with: Clean wound with Normal Saline 2. Anesthetic Topical Lidocaine 4% cream to wound bed prior to debridement 3. Peri-wound Care: Barrier  cream 4. Dressing Applied: Iodoflex 5. Secondary Dressing Applied ABD Pad Dry Gauze Kerlix/Conform 7. Secured with Tape Notes netting; heel offloading boot Electronic Signature(s) AYAAN, RINGLE ([id number]) Signed: 07/08/2015 4:27:53 PM By: Gina [name] Entered By: Gina [name] on 07/08/2015 08:25:23 Gina [name] ([id number]) -------------------------------------------------------------------------------- Vitals Details Patient Name: Gina [name] Date of Service: 07/08/2015 8:00 AM Medical Record Number: [id number] Patient Account Number: [id number] Date of Birth/Sex: August 15, 1917 (82 y.o. Female) Treating RN: Gina [name] Primary Care Physician: Gina [name] Other Clinician: Referring Physician: Einar [name] Treating Physician/Extender: Gina [name] in Treatment: 4 Vital Signs Time Taken: 08:21 Temperature (F): 98.1 Height (in): 63 Pulse (bpm): 81 Weight (lbs): 142 Respiratory Rate (breaths/min): 16 Body Mass Index (BMI): 25.2  Blood Pressure (mmHg): 140/61 Reference Range: 80 - 120 mg / dl Electronic Signature(s) Signed: 07/08/2015 4:27:53 PM By: Gina [name] Entered By: Gina [name] on 07/08/2015 08:22:43

## 2015-07-15 ENCOUNTER — Encounter (HOSPITAL_BASED_OUTPATIENT_CLINIC_OR_DEPARTMENT_OTHER): Payer: PPO | Admitting: General Surgery

## 2015-07-15 ENCOUNTER — Encounter: Payer: Self-pay | Admitting: General Surgery

## 2015-07-15 DIAGNOSIS — L8990 Pressure ulcer of unspecified site, unspecified stage: Secondary | ICD-10-CM | POA: Diagnosis not present

## 2015-07-15 DIAGNOSIS — E11621 Type 2 diabetes mellitus with foot ulcer: Secondary | ICD-10-CM | POA: Diagnosis not present

## 2015-07-15 NOTE — Progress Notes (Signed)
Pressure ulcer smaller.  Debrided with curette and dressed with alginate..  Off loading at home

## 2015-07-16 NOTE — Progress Notes (Signed)
Gina [name], [name]. ([id number]) Visit Report for 07/15/2015 Arrival Information Details Patient Name: Gina [name], [name]. Date of Service: 07/15/2015 8:00 AM Medical Record Number: [id number] Patient Account Number: [id number] Date of Birth/Sex: 1917-09-13 (82 y.o. Female) Treating RN: Ashok CordiaPinkerton, Debi Primary Care Physician: Einar CrowAnderson, Marshall Other Clinician: Referring Physician: Einar CrowAnderson, Marshall Treating Physician/Extender: Rudene ReBritto, Errol Weeks in Treatment: 5 Visit Information History Since Last Visit All ordered tests and consults were completed: No Patient Arrived: Wheel Chair Added or deleted any medications: No Arrival Time: 08:08 Any new allergies or adverse reactions: No Accompanied By: daughter Had a fall or experienced change in No Transfer Assistance: EasyPivot Patient activities of daily living that may affect Lift risk of falls: Patient Identification Verified: Yes Signs or symptoms of abuse/neglect since last No Secondary Verification Yes visito Process Completed: Hospitalized since last visit: No Patient Requires No Pain Present Now: No Transmission-Based Precautions: Patient Has Alerts: Yes Patient Alerts: DM II ABI right noncompressible Electronic Signature(s) Signed: 07/15/2015 4:39:38 PM By: Alejandro MullingPinkerton, Debra Entered By: Alejandro MullingPinkerton, Debra on 07/15/2015 08:24:16 Gina [name], [name]. ([id number]) -------------------------------------------------------------------------------- Encounter Discharge Information Details Patient Name: Gina [name], [name]. Date of Service: 07/15/2015 8:00 AM Medical Record Number: [id number] Patient Account Number: [id number] Date of Birth/Sex: 1917-09-13 (82 y.o. Female) Treating RN: Ashok CordiaPinkerton, Debi Primary Care Physician: Einar CrowAnderson, Marshall Other Clinician: Referring Physician: Einar CrowAnderson, Marshall Treating Physician/Extender: Elayne SnarePARKER, PETER Weeks in Treatment: 5 Encounter Discharge Information Items Discharge Pain Level:  0 Discharge Condition: Stable Ambulatory Status: Wheelchair Discharge Destination: Nursing Home Transportation: Other Accompanied By: daughter Schedule Follow-up Appointment: Yes Medication Reconciliation completed and provided to Patient/Care Yes Ebba Goll: Provided on Clinical Summary of Care: 07/15/2015 Form Type Recipient Paper Patient LW Electronic Signature(s) Signed: 07/15/2015 8:58:13 AM By: Ardath SaxParker, Peter MD Previous Signature: 07/15/2015 8:51:59 AM Version By: Gwenlyn PerkingMoore, Shelia Entered By: Ardath SaxParker, Peter on 07/15/2015 08:58:13 Gina [name], Marvis H. ([id number]) -------------------------------------------------------------------------------- Lower Extremity Assessment Details Patient Name: Gina [name], [name]. Date of Service: 07/15/2015 8:00 AM Medical Record Number: [id number] Patient Account Number: [id number] Date of Birth/Sex: 1917-09-13 (82 y.o. Female) Treating RN: Ashok CordiaPinkerton, Debi Primary Care Physician: Einar CrowAnderson, Marshall Other Clinician: Referring Physician: Einar CrowAnderson, Marshall Treating Physician/Extender: Elayne SnarePARKER, PETER Weeks in Treatment: 5 Vascular Assessment Pulses: Posterior Tibial Dorsalis Pedis Palpable: [Right:Yes] Extremity colors, hair growth, and conditions: Extremity Color: [Right:Normal] Temperature of Extremity: [Right:Warm] Capillary Refill: [Right:< 3 seconds] Toe Nail Assessment Left: Right: Thick: Yes Discolored: Yes Deformed: Yes Improper Length and Hygiene: No Electronic Signature(s) Signed: 07/15/2015 4:39:38 PM By: Alejandro MullingPinkerton, Debra Entered By: Alejandro MullingPinkerton, Debra on 07/15/2015 09:17:47 Gina [name], [name]. ([id number]) -------------------------------------------------------------------------------- Multi Wound Chart Details Patient Name: Gina [name], [name]. Date of Service: 07/15/2015 8:00 AM Medical Record Number: [id number] Patient Account Number: [id number] Date of Birth/Sex: 1917-09-13 (82 y.o. 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] [N/A:N/A] Secondary Etiology: [6:Pressure Ulcer] [N/A:N/A] Comorbid History: [6:Cataracts, Chronic sinus problems/congestion, Hypertension, Type II Diabetes, Osteoarthritis, Dementia, Seizure Disorder] [N/A:N/A] Date Acquired: [6:02/02/2015] [N/A:N/A] Weeks of Treatment: [6:5] [N/A:N/A] Wound Status: [6:Open] [N/A:N/A] Measurements L x W x D 0.6x0.2x0.3 [N/A:N/A] (cm) Area (cm) : [6:0.094] [N/A:N/A] Volume (cm) : [6:0.028] [N/A:N/A] % Reduction in Area: [6:97.90%] [N/A:N/A] % Reduction in Volume: 97.90% [N/A:N/A] Classification: [6:Grade  1] [N/A:N/A] Exudate Amount: [6:Large] [N/A:N/A] Exudate Type: [6:Serous] [N/A:N/A] Exudate Color: [6:amber] [N/A:N/A] Wound Margin: [6:Thickened] [N/A:N/A] Granulation Amount: [6:Medium (34-66%)] [N/A:N/A] Granulation Quality: [6:Red] [N/A:N/A] Necrotic Amount: [6:Medium (34-66%)] [N/A:N/A] Exposed Structures: [N/A:N/A] Fascia: No Fat: No Tendon: No Muscle: No Joint: No Bone: No Epithelialization: None N/A N/A Periwound Skin Texture: No Abnormalities Noted N/A N/A Periwound Skin Maceration: No N/A N/A Moisture: Moist: No Dry/Scaly: No Periwound Skin Color: Atrophie Blanche: No N/A N/A Cyanosis: No Ecchymosis: No Erythema: No Hemosiderin Staining: No Mottled: No Pallor: No Rubor: No Temperature: No Abnormality N/A N/A Tenderness on Yes N/A N/A Palpation: Wound Preparation: Ulcer Cleansing: N/A  N/A Rinsed/Irrigated with Saline Topical Anesthetic Applied: Other: lidocaine 4% Treatment Notes Wound #6 (Right Calcaneus) 1. Cleansed with: Clean wound with Normal Saline 2. Anesthetic Topical Lidocaine 4% cream to wound bed prior to debridement 3. Peri-wound Care: Barrier cream 4. Dressing Applied: Aquacel Ag 5. Secondary Dressing Applied ABD Pad Dry Gauze Kerlix/Conform 7. Secured with Tape Notes netting; heel offloading boot PEACHES, VANOVERBEKE ([id number]) Electronic Signature(s) Signed: 07/15/2015 4:39:38 PM By: Alejandro Mulling Entered By: Alejandro Mulling on 07/15/2015 09:19:13 Gina Boss ([id number]) -------------------------------------------------------------------------------- Multi-Disciplinary Care Plan Details Patient Name: Gina Boss Date of Service: 07/15/2015 8:00 AM Medical Record Number: [id number] Patient Account Number: [id number] Date of Birth/Sex: 03/23/1917 (82 y.o. Female) Treating RN: Ashok Cordia, Debi Primary Care Physician: Einar Crow Other Clinician: Referring Physician: Einar Crow Treating Physician/Extender: Elayne Snare in Treatment: 5 Active Inactive Abuse / Safety / Falls / Self Care Management Nursing Diagnoses: Potential for falls Goals: Patient will remain injury free Date Initiated: 06/09/2015 Goal Status: Active Interventions: Assess fall risk on admission and as needed Notes: Nutrition Nursing Diagnoses: Imbalanced nutrition Impaired glucose control: actual or potential Potential for alteratiion in Nutrition/Potential for imbalanced nutrition Goals: Patient/caregiver agrees to and verbalizes understanding of need to use nutritional supplements and/or vitamins as prescribed Date Initiated: 06/09/2015 Goal Status: Active Patient/caregiver verbalizes understanding of need to maintain therapeutic glucose control per primary care physician Date Initiated: 06/09/2015 Goal Status:  Active Patient/caregiver will maintain therapeutic glucose control Date Initiated: 06/09/2015 Goal Status: Active Interventions: Assess patient nutrition upon admission and as needed per policy DONNEISHA, BEANE ([id number]) Notes: Orientation to the Wound Care Program Nursing Diagnoses: Knowledge deficit related to the wound healing center program Goals: Patient/caregiver will verbalize understanding of the Wound Healing Center Program Date Initiated: 06/09/2015 Goal Status: Active Interventions: Provide education on orientation to the wound center Notes: Pain, Acute or Chronic Nursing Diagnoses: Pain, acute or chronic: actual or potential Potential alteration in comfort, pain Goals: Patient will verbalize adequate pain control and receive pain control interventions during procedures as needed Date Initiated: 06/09/2015 Goal Status: Active Interventions: Assess comfort goal upon admission Complete pain assessment as per visit requirements Notes: Pressure Nursing Diagnoses: Knowledge deficit related to causes and risk factors for pressure ulcer development Knowledge deficit related to management of pressures ulcers Goals: Patient will remain free from development of additional pressure ulcers Date Initiated: 06/09/2015 Goal Status: Active Interventions: SAJE, GALLOP ([id number]) Assess: immobility, friction, shearing, incontinence upon admission and as needed Assess offloading mechanisms upon admission and as needed Notes: Wound/Skin Impairment Nursing Diagnoses: Impaired tissue integrity Goals: Ulcer/skin breakdown will have a volume reduction of 30% by week 4 Date Initiated: 06/09/2015 Goal Status: Active Ulcer/skin breakdown will have a volume reduction of 50% by week 8 Date Initiated: 06/09/2015 Goal Status: Active Ulcer/skin breakdown will have a volume reduction of 80% by week 12 Date  Initiated: 06/09/2015 Goal Status: Active Interventions: Assess  ulceration(s) every visit Notes: Electronic Signature(s) Signed: 07/15/2015 4:39:38 PM By: Alejandro Mulling Entered By: Alejandro Mulling on 07/15/2015 09:18:52 Gina Boss ([id number]) -------------------------------------------------------------------------------- Pain Assessment Details Patient Name: Gina Boss Date of Service: 07/15/2015 8:00 AM Medical Record Number: [id number] Patient Account Number: [id number] Date of Birth/Sex: January 12, 1917 (82 y.o. Female) Treating RN: Ashok Cordia, Debi Primary Care Physician: Einar Crow Other Clinician: Referring Physician: Einar Crow Treating Physician/Extender: Rudene Re in Treatment: 5 Active Problems Location of Pain Severity and Description of Pain Patient Has Paino No Site Locations With Dressing Change: No Pain Management and Medication Current Pain Management: Electronic Signature(s) Signed: 07/15/2015 4:39:38 PM By: Alejandro Mulling Entered By: Alejandro Mulling on 07/15/2015 08:25:26 Gina Boss ([id number]) -------------------------------------------------------------------------------- Patient/Caregiver Education Details Patient Name: Gina Boss Date of Service: 07/15/2015 8:00 AM Medical Record Number: [id number] Patient Account Number: [id number] Date of Birth/Gender: 06-Gina [name]-1919 (82 y.o. Female) Treating RN: Ashok Cordia, Debi Primary Care Physician: Einar Crow Other Clinician: Referring Physician: Einar Crow Treating Physician/Extender: Elayne Snare in Treatment: 5 Education Assessment Education Provided To: Patient Education Topics Provided Wound/Skin Impairment: Handouts: Other: change dressing as ordered Methods: Demonstration, Explain/Verbal Responses: State content correctly Electronic Signature(s) Signed: 07/15/2015 4:57:40 PM By: Ardath Sax MD Entered By: Ardath Sax on 07/15/2015 08:58:23 Gina Boss  ([id number]) -------------------------------------------------------------------------------- Wound Assessment Details Patient Name: Gina Boss Date of Service: 07/15/2015 8:00 AM Medical Record Number: [id number] Patient Account Number: [id number] Date of Birth/Sex: 10-28-1917 (82 y.o. Female) Treating RN: Ashok Cordia, Debi Primary Care Physician: Einar Crow Other Clinician: Referring Physician: Einar Crow Treating Physician/Extender: Elayne Snare in Treatment: 5 Wound Status Wound Number: 6 Primary Diabetic Wound/Ulcer of the Lower Etiology: Extremity Wound Location: Right Calcaneus Secondary Pressure Ulcer Wounding Event: Pressure Injury Etiology: Date Acquired: 02/02/2015 Wound Open Weeks Of Treatment: 5 Status: Clustered Wound: No Comorbid Cataracts, Chronic sinus History: problems/congestion, Hypertension, Type II Diabetes, Osteoarthritis, Dementia, Seizure Disorder Photos Photo Uploaded By: Alejandro Mulling on 07/15/2015 09:29:15 Wound Measurements Length: (cm) 0.6 % Reduction in Width: (cm) 0.2 % Reduction in Depth: (cm) 0.3 Epithelializati Area: (cm) 0.094 Tunneling: Volume: (cm) 0.028 Undermining: Area: 97.9% Volume: 97.9% on: None No No Wound Description Classification: Grade 1 Wound Margin: Thickened Exudate Amount: Large Exudate Type: Serous Exudate Color: amber Foul Odor After Cleansing: No Wound Bed Granulation Amount: Medium (34-66%) Exposed Structure JOANN, JORGE ([id number]) Granulation Quality: Red Fascia Exposed: No Necrotic Amount: Medium (34-66%) Fat Layer Exposed: No Necrotic Quality: Adherent Slough Tendon Exposed: No Muscle Exposed: No Joint Exposed: No Bone Exposed: No Periwound Skin Texture Texture Color No Abnormalities Noted: Yes No Abnormalities Noted: No Atrophie Blanche: No Moisture Cyanosis: No No Abnormalities Noted: No Ecchymosis: No Dry / Scaly: No Erythema:  No Maceration: No Hemosiderin Staining: No Moist: No Mottled: No Pallor: No Rubor: No Temperature / Pain Temperature: No Abnormality Tenderness on Palpation: Yes Wound Preparation Ulcer Cleansing: Rinsed/Irrigated with Saline Topical Anesthetic Applied: Other: lidocaine 4%, Treatment Notes Wound #6 (Right Calcaneus) 1. Cleansed with: Clean wound with Normal Saline 2. Anesthetic Topical Lidocaine 4% cream to wound bed prior to debridement 3. Peri-wound Care: Barrier cream 4. Dressing Applied: Aquacel Ag 5. Secondary Dressing Applied ABD Pad Dry Gauze Kerlix/Conform 7. Secured with Tape Notes netting; heel offloading boot Electronic Signature(s) AUDRIONNA, LAMPTON ([id number]) Signed: 07/15/2015 4:39:38 PM By: Alejandro Mulling Entered By: Alejandro Mulling on 07/15/2015 09:18:18 Gina Boss ([id number]) -------------------------------------------------------------------------------- Vitals Details Patient Name: Gina Boss Date of Service:  07/15/2015 8:00 AM Medical Record Number: [id number] Patient Account Number: [id number] Date of Birth/Sex: [birth year] (82 y.o. Female) Treating RN: Ashok Cordia, Debi Primary Care Physician: Einar Crow Other Clinician: Referring Physician: Einar Crow Treating Physician/Extender: Rudene Re in Treatment: 5 Vital Signs Time Taken: 08:10 Temperature (F): 97.9 Height (in): 63 Pulse (bpm): 88 Weight (lbs): 142 Respiratory Rate (breaths/min): 16 Body Mass Index (BMI): 25.2 Blood Pressure (mmHg): 143/56 Reference Range: 80 - 120 mg / dl Electronic Signature(s) Signed: 07/15/2015 4:39:38 PM By: Alejandro Mulling Entered By: Alejandro Mulling on 07/15/2015 08:34:52

## 2015-07-18 NOTE — Progress Notes (Addendum)
Gina [name] ([id number]) Visit Report for 07/15/2015 Chief Complaint Document Details Patient Name: Gina [name], Gina [name] 07/15/2015 8:00 Date of Service: AM Medical Record [id number] Number: Patient Account Number: [id number] November 14, 1917 (82 y.o. Treating RN: Gina [name] Date of Birth/Sex: Female) Other Clinician: Primary Care Physician: Gina [name] Treating Gina [name] Referring Physician: Einar [name] Physician/Extender: Gina [name] in Treatment: 5 Information Obtained from: Patient Chief Complaint Patient is at the clinic for treatment of an open pressure ulcer to the right heel which she's had for about 4 months Electronic Signature(s) Signed: 07/15/2015 8:55:03 AM By: Gina Sax MD Entered By: Gina [name] on 07/15/2015 08:55:03 Gina [name] ([id number]) -------------------------------------------------------------------------------- Debridement Details Patient Name: Gina [name]. 07/15/2015 8:00 Date of Service: AM Medical Record [id number] Number: Patient Account Number: [id number] 12-25-1917 (82 y.o. Treating RN: Gina [name] Date of Birth/Sex: Female) Other Clinician: Primary Care Physician: Gina [name] Referring Physician: Einar [name] Physician/Extender: Gina [name] in Treatment: 5 Debridement Performed for Wound #6 Right Calcaneus Assessment: Performed By: Physician Gina Sax, MD Debridement: Debridement Pre-procedure Yes Verification/Time Out Taken: Start Time: 08:32 Pain Control: Other : lidocaine 4% cream Level: Skin/Subcutaneous Tissue Total Area Debrided (L x 0.6 (cm) x 0.2 (cm) = 0.12 (cm) W): Tissue and other Viable, Non-Viable, Exudate, Fibrin/Slough, Subcutaneous material debrided: Instrument: Curette Bleeding: Minimum Hemostasis Achieved: Pressure End Time: 08:34 Procedural Pain: 0 Post Procedural Pain: 0 Response to Treatment: Procedure was tolerated well Post  Debridement Measurements of Total Wound Length: (cm) 0.6 Width: (cm) 0.2 Depth: (cm) 0.3 Volume: (cm) 0.028 Post Procedure Diagnosis Same as Pre-procedure Electronic Signature(s) Signed: 07/19/2015 4:00:07 PM By: Gina [name] Signed: 07/20/2015 12:32:59 PM By: Gina Sax MD Previous Signature: 07/15/2015 4:39:38 PM Version By: Gina [name] Previous Signature: 07/15/2015 4:57:40 PM Version By: Gina Sax MD Entered By: Gina [name] on 07/19/2015 15:55:19 Gina [name] ([name], [name] ([id number]) -------------------------------------------------------------------------------- HPI Details Patient Name: Gina [name] 07/15/2015 8:00 Date of Service: AM Medical Record [id number] Number: Patient Account Number: [id number] 03-22-1917 (82 y.o. Treating RN: Gina [name] Date of Birth/Sex: Female) Other Clinician: Primary Care Physician: Gina [name] Referring Physician: Einar [name] Physician/Extender: Gina [name] in Treatment: 5 History of Present Illness Location: injury to the right heel with drainage Quality: Patient reports No Pain. Severity: Patient states wound are getting worse. Duration: Patient has had the wound for > 4 months prior to seeking treatment at the wound center Timing: Pain in wound is Intermittent (comes and goes Context: The wound appeared gradually over time Modifying Factors: Other treatment(s) tried include:was seen by her PCP and put on Keflex recently but the dose is complete Associated Signs and Symptoms: Patient reports having foul odor. HPI Description: 82 year old patient who is a resident of a nursing home who comes to see as with a decubitus ulcer of the right heel. He has a past medical history of diabetes mellitus, hypercholesterolemia, hypothyroidism, GERD, hypertension, dementia, chronic kidney disease and age-related osteoporosis. She is status post cholecystectomy,  hysterectomy, back fusion, debridement of necrotic ulcer right thigh in February 2017. She has never been a smoker. He has been seen at our wound center in the past and was treated in 2015. She was recently seen by her PCP Gina [name], for a heel ulcer and he had recommend Keflex and local dressing. 06/17/2015 -- grew Escherichia coli which was treated by Dr. Leanord [name] with Keflex 500 mg by mouth 3 times a day for 7 days. Culture results were noted -- wound culture grew Escherichia  coli sensitive to Bactrim, ampicillin, ampicillin sulbactam, Kefzol and, cefepime. 06/24/2015 -- x-ray of the right foot was done and this showed no evidence of osteomyelitis. Electronic Signature(s) Signed: 07/15/2015 8:55:31 AM By: Gina Sax MD Entered By: Gina [name] on 07/15/2015 08:55:31 Gina [name] ([id number]) -------------------------------------------------------------------------------- Physical Exam Details Patient Name: Gina [name] 07/15/2015 8:00 Date of Service: AM Medical Record [id number] Number: Patient Account Number: [id number] [birth year] (82 y.o. Treating RN: Gina [name] Date of Birth/Sex: Female) Other Clinician: Primary Care Physician: Gina [name] Referring Physician: Einar [name] Physician/Extender: Gina [name] in Treatment: 5 Electronic Signature(s) Signed: 07/15/2015 8:55:39 AM By: Gina Sax MD Entered By: Gina [name] on 07/15/2015 08:55:39 Gina [name] ([id number]) -------------------------------------------------------------------------------- Physician Orders Details Patient Name: Gina [name] 07/15/2015 8:00 Date of Service: AM Medical Record [id number] Number: Patient Account Number: [id number] November 11, 1917 (82 y.o. Treating RN: Gina [name] Date of Birth/Sex: Female) Other Clinician: Primary Care Physician: Gina [name] Treating Evlyn Kanner Referring Physician: Einar [name] Physician/Extender: Gina [name] in Treatment: 5 Verbal / Phone Orders: Yes Clinician: Ashok [name], Gina [name] Read Back and Verified: Yes Diagnosis Coding Wound Cleansing Wound #6 Right Calcaneus o Clean wound with Normal Saline. o Cleanse wound with mild soap and water Anesthetic Wound #6 Right Calcaneus o Topical Lidocaine 4% cream applied to wound bed prior to debridement - for clinic use Skin Barriers/Peri-Wound Care Wound #6 Right Calcaneus o Barrier cream - zinc Primary Wound Dressing Wound #6 Right Calcaneus o Aquacel Ag - silver alginate Secondary Dressing Wound #6 Right Calcaneus o Gauze, ABD and Kerlix/Conform - netting, tape o Foam Dressing Change Frequency Wound #6 Right Calcaneus o Change dressing every day. Follow-up Appointments Wound #6 Right Calcaneus o Return Appointment in 1 week. Edema Control Wound #6 Right Calcaneus o Elevate legs to the level of the heart and pump ankles as often as possible ADLAI, NIEBLAS. ([id number]) Off-Loading Wound #6 Right Calcaneus o Turn and reposition every 2 hours o Other: - sage boots float heels Additional Orders / Instructions Wound #6 Right Calcaneus o Increase protein intake. Medications-please add to medication list. Wound #6 Right Calcaneus o Other: - Vitamin C, Vitamin A, Zinc, Multivitamin Electronic Signature(s) Signed: 07/15/2015 4:39:38 PM By: Gina [name] Signed: 07/18/2015 3:43:28 PM By: Evlyn Kanner MD, FACS Entered By: Gina [name] on 07/15/2015 08:36:54 Gina [name] ([id number]) -------------------------------------------------------------------------------- Problem List Details Patient Name: ALEJA, YEARWOOD 07/15/2015 8:00 Date of Service: AM Medical Record [id number] Number: Patient Account Number: [id number] 02-14-1917 (82 y.o. Treating RN: Gina [name] Date of Birth/Sex: Female) Other Clinician: Primary Care Physician: Gina Mody, Etta Gassett Referring Physician: Einar [name] Physician/Extender: Gina [name] in Treatment: 5 Active Problems ICD-10 Encounter Code Description Active Date Diagnosis E11.621 Type 2 diabetes mellitus with foot ulcer 06/09/2015 Yes L89.613 Pressure ulcer of right heel, stage 3 06/09/2015 Yes F03.90 Unspecified dementia without behavioral disturbance 06/09/2015 Yes Inactive Problems Resolved Problems Electronic Signature(s) Signed: 07/15/2015 8:54:54 AM By: Gina Sax MD Entered By: Gina [name] on 07/15/2015 08:54:54 Gina [name] ([id number]) -------------------------------------------------------------------------------- Progress Note Details Patient Name: Gina [name] 07/15/2015 8:00 Date of Service: AM Medical Record [id number] Number: Patient Account Number: [id number] [birth year] (82 y.o. Treating RN: Gina [name] Date of Birth/Sex: Female) Other Clinician: Primary Care Physician: Gina [name] Treating Gina Ralph, Jaylenn Altier Referring Physician: Einar [name] Physician/Extender: Gina [name] in Treatment: 5 Subjective Chief Complaint Information obtained from Patient Patient is at the clinic for treatment of an open pressure ulcer to the right heel which she's had for about 4 months History  of Present Illness (HPI) The following HPI elements were documented for the patient's wound: Location: injury to the right heel with drainage Quality: Patient reports No Pain. Severity: Patient states wound are getting worse. Duration: Patient has had the wound for > 4 months prior to seeking treatment at the wound center Timing: Pain in wound is Intermittent (comes and goes Context: The wound appeared gradually over time Modifying Factors: Other treatment(s) tried include:was seen by her PCP and put on Keflex recently but the dose is complete Associated Signs and Symptoms: Patient reports having foul odor. 82 year old patient who is a resident of a nursing  home who comes to see as with a decubitus ulcer of the right heel. He has a past medical history of diabetes mellitus, hypercholesterolemia, hypothyroidism, GERD, hypertension, dementia, chronic kidney disease and age-related osteoporosis. She is status post cholecystectomy, hysterectomy, back fusion, debridement of necrotic ulcer right thigh in February 2017. She has never been a smoker. He has been seen at our wound center in the past and was treated in 2015. She was recently seen by her PCP Gina [name], for a heel ulcer and he had recommend Keflex and local dressing. 06/17/2015 -- grew Escherichia coli which was treated by Dr. Leanord [name] with Keflex 500 mg by mouth 3 times a day for 7 days. Culture results were noted -- wound culture grew Escherichia coli sensitive to Bactrim, ampicillin, ampicillin sulbactam, Kefzol and, cefepime. 06/24/2015 -- x-ray of the right foot was done and this showed no evidence of osteomyelitis. NAILEA, WHITEHORN ([id number]) Objective Constitutional Vitals Time Taken: 8:10 AM, Height: 63 in, Weight: 142 lbs, BMI: 25.2, Temperature: 97.9 F, Pulse: 88 bpm, Respiratory Rate: 16 breaths/min, Blood Pressure: 143/56 mmHg. Integumentary (Hair, Skin) Wound #6 status is Open. Original cause of wound was Pressure Injury. The wound is located on the Right Calcaneus. The wound measures 0.6cm length x 0.2cm width x 0.3cm depth; 0.094cm^2 area and 0.028cm^3 volume. There is no tunneling or undermining noted. There is a large amount of serous drainage noted. The wound margin is thickened. There is medium (34-66%) red granulation within the wound bed. There is a medium (34-66%) amount of necrotic tissue within the wound bed including Adherent Slough. The periwound skin appearance had no abnormalities noted for texture. The periwound skin appearance did not exhibit: Dry/Scaly, Maceration, Moist, Atrophie Blanche, Cyanosis, Ecchymosis, Hemosiderin Staining, Mottled,  Pallor, Rubor, Erythema. Periwound temperature was noted as No Abnormality. The periwound has tenderness on palpation. Assessment Active Problems ICD-10 E11.621 - Type 2 diabetes mellitus with foot ulcer L89.613 - Pressure ulcer of right heel, stage 3 F03.90 - Unspecified dementia without behavioral disturbance Procedures Wound #6 Wound #6 is a Diabetic Wound/Ulcer of the Lower Extremity located on the Right Calcaneus . There was a Skin/Subcutaneous Tissue Debridement ([id number]) debridement with total area of 0.12 sq cm performed by Gina Sax, MD. with the following instrument(s): Curette to remove Viable and Non- Viable tissue/material including Exudate, Fibrin/Slough, and Subcutaneous after achieving pain control using Other (lidocaine 4% cream). A time out was conducted prior to the start of the procedure. A Minimum amount of bleeding was controlled with Pressure. The procedure was tolerated well with a pain level of 0 throughout and a pain level of 0 following the procedure. Post Debridement Measurements: 0.6cm length x 0.2cm width x 0.3cm depth; 0.028cm^3 volume. Post procedure Diagnosis Wound #6: Same as Pre-Procedure JOURDYN, HASLER. ([id number]) Pressure ulcer 1 x.5 cm debrided and dressed with silver alginate. Off loading boot Plan  Wound Cleansing: Wound #6 Right Calcaneus: Clean wound with Normal Saline. Cleanse wound with mild soap and water Anesthetic: Wound #6 Right Calcaneus: Topical Lidocaine 4% cream applied to wound bed prior to debridement - for clinic use Skin Barriers/Peri-Wound Care: Wound #6 Right Calcaneus: Barrier cream - zinc Primary Wound Dressing: Wound #6 Right Calcaneus: Aquacel Ag - silver alginate Secondary Dressing: Wound #6 Right Calcaneus: Gauze, ABD and Kerlix/Conform - netting, tape Foam Dressing Change Frequency: Wound #6 Right Calcaneus: Change dressing every day. Follow-up Appointments: Wound #6 Right Calcaneus: Return  Appointment in 1 week. Edema Control: Wound #6 Right Calcaneus: Elevate legs to the level of the heart and pump ankles as often as possible Off-Loading: Wound #6 Right Calcaneus: Turn and reposition every 2 hours Other: - sage boots float heels Additional Orders / Instructions: Wound #6 Right Calcaneus: Increase protein intake. Medications-please add to medication list.: Wound #6 Right Calcaneus: Other: - Vitamin C, Vitamin A, Zinc, Multivitamin Gina [name], [name]. ([id number]) Follow-Up Appointments: A follow-up appointment should be scheduled. Medication Reconciliation completed and provided to Patient/Care Provider. [facility] Clinical [facility] Electronic Signature(s) Signed: 07/22/2015 12:16:22 PM By: Gina SaxParker, Victor Langenbach MD Previous Signature: 07/15/2015 8:57:25 AM Version By: Gina SaxParker, Zanyiah Posten MD Entered By: Gina SaxParker, Leandro Berkowitz on 07/22/2015 12:16:22 Gina [name], [name]. ([id number]) -------------------------------------------------------------------------------- SuperBill Details Patient Name: Gina [name], [name]. Date of Service: 07/15/2015 Medical Record Number: [id number] Patient Account Number: [id number] Date of Birth/Sex: May 27, 1917 (82 y.o. Female) Treating RN: Gina HaggisPinkerton, Gina [name] Primary Care Physician: Gina CrowAnderson, Marshall Other Clinician: Referring Physician: Einar CrowAnderson, Marshall Treating Physician/Extender: Elayne SnarePARKER, Love Chowning Weeks in Treatment: 5 Diagnosis Coding ICD-10 Codes Code Description E11.621 Type 2 diabetes mellitus with foot ulcer L89.613 Pressure ulcer of right heel, stage 3 F03.90 Unspecified dementia without behavioral disturbance Facility Procedures CPT4 Code: [id number] Description: 11042 - DEB SUBQ TISSUE 20 SQ CM/< ICD-10 Description Diagnosis E11.621 Type 2 diabetes mellitus with foot ulcer L89.613 Pressure ulcer of right heel, stage 3 F03.90 Unspecified dementia without behavioral disturb Modifier: ance Quantity: 1 Physician Procedures CPT4  Code: [id number] Description: 99213 - WC PHYS LEVEL 3 - EST PT ICD-10 Description Diagnosis L89.613 Pressure ulcer of right heel, stage 3 Modifier: Quantity: 1 CPT4 Code: [id number] Description: 11042 - WC PHYS SUBQ TISS 20 SQ CM ICD-10 Description Diagnosis E11.621 Type 2 diabetes mellitus with foot ulcer L89.613 Pressure ulcer of right heel, stage 3 F03.90 Unspecified dementia without behavioral disturba Modifier: nce Quantity: 1 Electronic Signature(s) Signed: 07/19/2015 4:00:07 PM By: Gina MullingPinkerton, Debra Signed: 07/20/2015 12:32:59 PM By: Gina SaxParker, Shondell Poulson MD Previous Signature: 07/15/2015 8:57:45 AM Version By: Gina SaxParker, Kearia Yin MD Gina [name], [name]. ([id number]) Entered By: Gina MullingPinkerton, Debra on 07/19/2015 15:57:22

## 2015-07-22 ENCOUNTER — Ambulatory Visit: Payer: PPO | Admitting: Surgery

## 2015-07-22 DIAGNOSIS — F0390 Unspecified dementia without behavioral disturbance: Secondary | ICD-10-CM | POA: Diagnosis not present

## 2015-07-22 DIAGNOSIS — I10 Essential (primary) hypertension: Secondary | ICD-10-CM | POA: Diagnosis not present

## 2015-07-22 DIAGNOSIS — E119 Type 2 diabetes mellitus without complications: Secondary | ICD-10-CM | POA: Diagnosis not present

## 2015-07-22 DIAGNOSIS — N183 Chronic kidney disease, stage 3 (moderate): Secondary | ICD-10-CM | POA: Diagnosis not present

## 2015-07-29 ENCOUNTER — Encounter: Payer: PPO | Admitting: Surgery

## 2015-07-29 DIAGNOSIS — F0390 Unspecified dementia without behavioral disturbance: Secondary | ICD-10-CM | POA: Diagnosis not present

## 2015-07-29 DIAGNOSIS — S41112A Laceration without foreign body of left upper arm, initial encounter: Secondary | ICD-10-CM | POA: Diagnosis not present

## 2015-07-29 DIAGNOSIS — L97411 Non-pressure chronic ulcer of right heel and midfoot limited to breakdown of skin: Secondary | ICD-10-CM | POA: Diagnosis not present

## 2015-07-29 DIAGNOSIS — S51002A Unspecified open wound of left elbow, initial encounter: Secondary | ICD-10-CM | POA: Diagnosis not present

## 2015-07-29 DIAGNOSIS — E11621 Type 2 diabetes mellitus with foot ulcer: Secondary | ICD-10-CM | POA: Diagnosis not present

## 2015-07-29 DIAGNOSIS — L89613 Pressure ulcer of right heel, stage 3: Secondary | ICD-10-CM | POA: Diagnosis not present

## 2015-07-30 NOTE — Progress Notes (Signed)
DALAYNA, LAUTER ([id number]) Visit Report for 07/29/2015 Chief Complaint Document Details Patient Name: Gina [name], Gina [name] 07/29/2015 8:00 Date of Service: AM Medical Record [id number] Number: Patient Account Number: [id number] [birth year] (82 y.o. Treating RN: Phillis Haggis Date of Birth/Sex: Female) Other Clinician: Primary Care Physician: Einar Crow Treating Evlyn Kanner Referring Physician: Einar Crow Physician/Extender: Tania Ade in Treatment: 7 Information Obtained from: Patient Chief Complaint Patient is at the clinic for treatment of an open pressure ulcer to the right heel which she's had for about 4 months Electronic Signature(s) Signed: 07/29/2015 8:37:49 AM By: Evlyn Kanner MD, FACS Entered By: Evlyn Kanner on 07/29/2015 08:37:49 Gina [name] ([id number]) -------------------------------------------------------------------------------- Debridement Details Patient Name: Gina [name] 07/29/2015 8:00 Date of Service: AM Medical Record [id number] Number: Patient Account Number: [id number] Jun 20, 1917 (82 y.o. Treating RN: Phillis Haggis Date of Birth/Sex: Female) Other Clinician: Primary Care Physician: Einar Crow Treating Evlyn Kanner Referring Physician: Einar Crow Physician/Extender: Tania Ade in Treatment: 7 Debridement Performed for Wound #6 Right Calcaneus Assessment: Performed By: Physician Evlyn Kanner, MD Debridement: Debridement Pre-procedure Yes Verification/Time Out Taken: Start Time: 08:31 Pain Control: Other : lidocaine 4% cream Level: Skin/Subcutaneous Tissue Total Area Debrided (L x 0.6 (cm) x 1.3 (cm) = 0.78 (cm) W): Tissue and other Viable, Non-Viable, Exudate, Fibrin/Slough, Subcutaneous material debrided: Instrument: Curette Bleeding: Minimum Hemostasis Achieved: Pressure End Time: 08:33 Procedural Pain: 0 Post Procedural Pain: 0 Response to Treatment: Procedure was tolerated  well Post Debridement Measurements of Total Wound Length: (cm) 0.6 Width: (cm) 1.3 Depth: (cm) 0.2 Volume: (cm) 0.123 Post Procedure Diagnosis Same as Pre-procedure Electronic Signature(s) Signed: 07/29/2015 8:37:42 AM By: Evlyn Kanner MD, FACS Signed: 07/29/2015 4:45:48 PM By: Alejandro Mulling Entered By: Evlyn Kanner on 07/29/2015 08:37:42 Gina [name] ([id number]) -------------------------------------------------------------------------------- HPI Details Patient Name: Gina [name] 07/29/2015 8:00 Date of Service: AM Medical Record [id number] Number: Patient Account Number: [id number] [birth year] (82 y.o. Treating RN: Phillis Haggis Date of Birth/Sex: Female) Other Clinician: Primary Care Physician: Einar Crow Treating Evlyn Kanner Referring Physician: Einar Crow Physician/Extender: Tania Ade in Treatment: 7 History of Present Illness Location: injury to the right heel with drainage Quality: Patient reports No Pain. Severity: Patient states wound are getting worse. Duration: Patient has had the wound for > 4 months prior to seeking treatment at the wound center Timing: Pain in wound is Intermittent (comes and goes Context: The wound appeared gradually over time Modifying Factors: Other treatment(s) tried include:was seen by her PCP and put on Keflex recently but the dose is complete Associated Signs and Symptoms: Patient reports having foul odor. HPI Description: 82 year old patient who is a resident of a nursing home who comes to see as with a decubitus ulcer of the right heel. He has a past medical history of diabetes mellitus, hypercholesterolemia, hypothyroidism, GERD, hypertension, dementia, chronic kidney disease and age-related osteoporosis. She is status post cholecystectomy, hysterectomy, back fusion, debridement of necrotic ulcer right thigh in February 2017. She has never been a smoker. He has been seen at our wound center in the  past and was treated in 2015. She was recently seen by her PCP Dr. Einar Crow, for a heel ulcer and he had recommend Keflex and local dressing. 06/17/2015 -- grew Escherichia coli which was treated by Dr. Leanord Hawking with Keflex 500 mg by mouth 3 times a day for 7 days. Culture results were noted -- wound culture grew Escherichia coli sensitive to Bactrim, ampicillin, ampicillin sulbactam, Kefzol and, cefepime. 06/24/2015 -- x-ray of the right foot was done and this  showed no evidence of osteomyelitis. Electronic Signature(s) Signed: 07/29/2015 8:37:54 AM By: Evlyn Kanner MD, FACS Entered By: Evlyn Kanner on 07/29/2015 08:37:54 Gina [name] ([id number]) -------------------------------------------------------------------------------- Physical Exam Details Patient Name: Gina [name], Gina [name] 07/29/2015 8:00 Date of Service: AM Medical Record [id number] Number: Patient Account Number: [id number] 02/03/1917 (82 y.o. Treating RN: Phillis Haggis Date of Birth/Sex: Female) Other Clinician: Primary Care Physician: Einar Crow Treating Evlyn Kanner Referring Physician: Einar Crow Physician/Extender: Tania Ade in Treatment: 7 Constitutional . Pulse regular. Respirations normal and unlabored. Afebrile. . Eyes Nonicteric. Reactive to light. Ears, Nose, Mouth, and Throat Lips, teeth, and gums WNL.Marland Kitchen Moist mucosa without lesions. Neck supple and nontender. No palpable supraclavicular or cervical adenopathy. Normal sized without goiter. Respiratory WNL. No retractions.. Breath sounds WNL, No rubs, rales, rhonchi, or wheeze.. Cardiovascular Heart rhythm and rate regular, no murmur or gallop.. Pedal Pulses WNL. No clubbing, cyanosis or edema. Lymphatic No adneopathy. No adenopathy. No adenopathy. Musculoskeletal Adexa without tenderness or enlargement.. Digits and nails w/o clubbing, cyanosis, infection, petechiae, ischemia, or inflammatory conditions.. Integumentary  (Hair, Skin) No suspicious lesions. No crepitus or fluctuance. No peri-wound warmth or erythema. No masses.Marland Kitchen Psychiatric Judgement and insight Intact.. No evidence of depression, anxiety, or agitation.. Notes Wound on her left upper arm near the lateral elbow area is a full-thickness skin tear which has the granulation tissue and no debridement was required. The right calcaneal region has significant amount of subcutaneous debris and this was sharply excised with a #3 curet and bleeding controlled with pressure Electronic Signature(s) Signed: 07/29/2015 8:39:02 AM By: Evlyn Kanner MD, FACS Entered By: Evlyn Kanner on 07/29/2015 08:39:02 Gina [name] ([id number]) -------------------------------------------------------------------------------- Physician Orders Details Patient Name: Gina [name], Gina [name] 07/29/2015 8:00 Date of Service: AM Medical Record [id number] Number: Patient Account Number: [id number] Mar 20, 1917 (82 y.o. Treating RN: Phillis Haggis Date of Birth/Sex: Female) Other Clinician: Primary Care Physician: Einar Crow Treating Evlyn Kanner Referring Physician: Einar Crow Physician/Extender: Tania Ade in Treatment: 7 Verbal / Phone Orders: Yes Clinician: Ashok Cordia, Debi Read Back and Verified: Yes Diagnosis Coding Wound Cleansing Wound #6 Right Calcaneus o Clean wound with Normal Saline. o Cleanse wound with mild soap and water Wound #9 Left Elbow o Clean wound with Normal Saline. o Cleanse wound with mild soap and water Anesthetic Wound #6 Right Calcaneus o Topical Lidocaine 4% cream applied to wound bed prior to debridement - for clinic use Wound #9 Left Elbow o Topical Lidocaine 4% cream applied to wound bed prior to debridement - for clinic use Skin Barriers/Peri-Wound Care Wound #6 Right Calcaneus o Barrier cream - zinc Primary Wound Dressing Wound #6 Right Calcaneus o Prisma Ag - moisten with saline Wound #9 Left  Elbow o Prisma Ag - DO NOT CHANGE DRESSING Secondary Dressing Wound #6 Right Calcaneus o Gauze, ABD and Kerlix/Conform - netting, tape o Foam Wound #9 Left Elbow o Conform/Kerlix - DO NOT CHANGE DRESSING HOSBACH, Kalan H. ([id number]) o Boardered Foam Dressing - DO NOT CHANGE DRESSING Dressing Change Frequency Wound #6 Right Calcaneus o Change dressing every other day. Wound #9 Left Elbow o Change dressing every week - ONLY AT CLINIC Follow-up Appointments Wound #6 Right Calcaneus o Return Appointment in 1 week. Wound #9 Left Elbow o Return Appointment in 1 week. Edema Control Wound #6 Right Calcaneus o Elevate legs to the level of the heart and pump ankles as often as possible Off-Loading Wound #6 Right Calcaneus o Turn and reposition every 2 hours o Other: - sage boots float heels Wound #9 Left  Elbow o Turn and reposition every 2 hours o Other: - sage boots float heels Additional Orders / Instructions Wound #6 Right Calcaneus o Increase protein intake. Medications-please add to medication list. Wound #6 Right Calcaneus o Other: - Vitamin C, Vitamin A, Zinc, Multivitamin Wound #9 Left Elbow o Other: - Vitamin C, Vitamin A, Zinc, Multivitamin Electronic Signature(s) Signed: 07/29/2015 3:01:37 PM By: Evlyn Kanner MD, FACS Signed: 07/29/2015 4:45:48 PM By: Elmore Guise, Everardo Beals ([id number]) Entered By: Alejandro Mulling on 07/29/2015 08:36:53 Gina [name] ([id number]) -------------------------------------------------------------------------------- Problem List Details Patient Name: Gina [name], Gina [name] 07/29/2015 8:00 Date of Service: AM Medical Record [id number] Number: Patient Account Number: [id number] 13-Sep-1917 (82 y.o. Treating RN: Phillis Haggis Date of Birth/Sex: Female) Other Clinician: Primary Care Physician: Einar Crow Treating Evlyn Kanner Referring Physician: Einar Crow Physician/Extender: Tania Ade in Treatment: 7 Active Problems ICD-10 Encounter Code Description Active Date Diagnosis E11.621 Type 2 diabetes mellitus with foot ulcer 06/09/2015 Yes L89.613 Pressure ulcer of right heel, stage 3 06/09/2015 Yes F03.90 Unspecified dementia without behavioral disturbance 06/09/2015 Yes S41.112A Laceration without foreign body of left upper arm, initial 07/29/2015 Yes encounter Inactive Problems Resolved Problems Electronic Signature(s) Signed: 07/29/2015 8:37:33 AM By: Evlyn Kanner MD, FACS Previous Signature: 07/29/2015 8:37:06 AM Version By: Evlyn Kanner MD, FACS Entered By: Evlyn Kanner on 07/29/2015 08:37:33 Gina [name] ([id number]) -------------------------------------------------------------------------------- Progress Note Details Patient Name: Gina [name] 07/29/2015 8:00 Date of Service: AM Medical Record [id number] Number: Patient Account Number: [id number] 11-01-1917 (82 y.o. Treating RN: Phillis Haggis Date of Birth/Sex: Female) Other Clinician: Primary Care Physician: Einar Crow Treating Evlyn Kanner Referring Physician: Einar Crow Physician/Extender: Tania Ade in Treatment: 7 Subjective Chief Complaint Information obtained from Patient Patient is at the clinic for treatment of an open pressure ulcer to the right heel which she's had for about 4 months History of Present Illness (HPI) The following HPI elements were documented for the patient's wound: Location: injury to the right heel with drainage Quality: Patient reports No Pain. Severity: Patient states wound are getting worse. Duration: Patient has had the wound for > 4 months prior to seeking treatment at the wound center Timing: Pain in wound is Intermittent (comes and goes Context: The wound appeared gradually over time Modifying Factors: Other treatment(s) tried include:was seen by her PCP and put on Keflex recently but the dose is  complete Associated Signs and Symptoms: Patient reports having foul odor. 82 year old patient who is a resident of a nursing home who comes to see as with a decubitus ulcer of the right heel. He has a past medical history of diabetes mellitus, hypercholesterolemia, hypothyroidism, GERD, hypertension, dementia, chronic kidney disease and age-related osteoporosis. She is status post cholecystectomy, hysterectomy, back fusion, debridement of necrotic ulcer right thigh in February 2017. She has never been a smoker. He has been seen at our wound center in the past and was treated in 2015. She was recently seen by her PCP Dr. Einar Crow, for a heel ulcer and he had recommend Keflex and local dressing. 06/17/2015 -- grew Escherichia coli which was treated by Dr. Leanord Hawking with Keflex 500 mg by mouth 3 times a day for 7 days. Culture results were noted -- wound culture grew Escherichia coli sensitive to Bactrim, ampicillin, ampicillin sulbactam, Kefzol and, cefepime. 06/24/2015 -- x-ray of the right foot was done and this showed no evidence of osteomyelitis. Gina [name], Gina [name] ([id number]) Objective Constitutional Pulse regular. Respirations normal and unlabored. Afebrile. Vitals Time Taken: 8:13 AM, Height: 63 in, Weight: 142  lbs, BMI: 25.2, Temperature: 98.4 F, Pulse: 79 bpm, Respiratory Rate: 16 breaths/min, Blood Pressure: 144/67 mmHg. Eyes Nonicteric. Reactive to light. Ears, Nose, Mouth, and Throat Lips, teeth, and gums WNL.Marland Kitchen Moist mucosa without lesions. Neck supple and nontender. No palpable supraclavicular or cervical adenopathy. Normal sized without goiter. Respiratory WNL. No retractions.. Breath sounds WNL, No rubs, rales, rhonchi, or wheeze.. Cardiovascular Heart rhythm and rate regular, no murmur or gallop.. Pedal Pulses WNL. No clubbing, cyanosis or edema. Lymphatic No adneopathy. No adenopathy. No adenopathy. Musculoskeletal Adexa without tenderness or enlargement..  Digits and nails w/o clubbing, cyanosis, infection, petechiae, ischemia, or inflammatory conditions.Marland Kitchen Psychiatric Judgement and insight Intact.. No evidence of depression, anxiety, or agitation.. General Notes: Wound on her left upper arm near the lateral elbow area is a full-thickness skin tear which has the granulation tissue and no debridement was required. The right calcaneal region has significant amount of subcutaneous debris and this was sharply excised with a #3 curet and bleeding controlled with pressure Integumentary (Hair, Skin) No suspicious lesions. No crepitus or fluctuance. No peri-wound warmth or erythema. No masses.. Wound #6 status is Open. Original cause of wound was Pressure Injury. The wound is located on the Right Calcaneus. The wound measures 0.6cm length x 1.3cm width x 0.2cm depth; 0.613cm^2 area and 0.123cm^3 volume. There is no tunneling or undermining noted. There is a large amount of serous drainage noted. The wound margin is thickened. There is small (1-33%) red granulation within the wound bed. There is a large (67-100%) amount of necrotic tissue within the wound bed including Adherent Slough. The periwound skin appearance had no abnormalities noted for texture. The periwound skin appearance did not Gina [name], Gina [name]. ([id number]) exhibit: Dry/Scaly, Maceration, Moist, Atrophie Blanche, Cyanosis, Ecchymosis, Hemosiderin Staining, Mottled, Pallor, Rubor, Erythema. Periwound temperature was noted as No Abnormality. The periwound has tenderness on palpation. Wound #9 status is Open. Original cause of wound was Trauma. The wound is located on the Left Elbow. The wound measures 1cm length x 1.8cm width x 0.1cm depth; 1.414cm^2 area and 0.141cm^3 volume. The wound is limited to skin breakdown. There is no tunneling or undermining noted. There is a large amount of serosanguineous drainage noted. The wound margin is flat and intact. There is large (67-100%) red  granulation within the wound bed. There is a small (1-33%) amount of necrotic tissue within the wound bed including Adherent Slough. The periwound skin appearance exhibited: Moist. Periwound temperature was noted as No Abnormality. The periwound has tenderness on palpation. Assessment Active Problems ICD-10 E11.621 - Type 2 diabetes mellitus with foot ulcer L89.613 - Pressure ulcer of right heel, stage 3 F03.90 - Unspecified dementia without behavioral disturbance S41.112A - Laceration without foreign body of left upper arm, initial encounter Procedures Wound #6 Wound #6 is a Diabetic Wound/Ulcer of the Lower Extremity located on the Right Calcaneus . There was a Skin/Subcutaneous Tissue Debridement ([id number]) debridement with total area of 0.78 sq cm performed by Evlyn Kanner, MD. with the following instrument(s): Curette to remove Viable and Non-Viable tissue/material including Exudate, Fibrin/Slough, and Subcutaneous after achieving pain control using Other (lidocaine 4% cream). A time out was conducted prior to the start of the procedure. A Minimum amount of bleeding was controlled with Pressure. The procedure was tolerated well with a pain level of 0 throughout and a pain level of 0 following the procedure. Post Debridement Measurements: 0.6cm length x 1.3cm width x 0.2cm depth; 0.123cm^3 volume. Post procedure Diagnosis Wound #6: Same as Pre-Procedure Plan Gina [name], Gina  H. ([id number]) Wound Cleansing: Wound #6 Right Calcaneus: Clean wound with Normal Saline. Cleanse wound with mild soap and water Wound #9 Left Elbow: Clean wound with Normal Saline. Cleanse wound with mild soap and water Anesthetic: Wound #6 Right Calcaneus: Topical Lidocaine 4% cream applied to wound bed prior to debridement - for clinic use Wound #9 Left Elbow: Topical Lidocaine 4% cream applied to wound bed prior to debridement - for clinic use Skin Barriers/Peri-Wound Care: Wound #6 Right  Calcaneus: Barrier cream - zinc Primary Wound Dressing: Wound #6 Right Calcaneus: Prisma Ag - moisten with saline Wound #9 Left Elbow: Prisma Ag - DO NOT CHANGE DRESSING Secondary Dressing: Wound #6 Right Calcaneus: Gauze, ABD and Kerlix/Conform - netting, tape Foam Wound #9 Left Elbow: Conform/Kerlix - DO NOT CHANGE DRESSING Boardered Foam Dressing - DO NOT CHANGE DRESSING Dressing Change Frequency: Wound #6 Right Calcaneus: Change dressing every other day. Wound #9 Left Elbow: Change dressing every week - ONLY AT CLINIC Follow-up Appointments: Wound #6 Right Calcaneus: Return Appointment in 1 week. Wound #9 Left Elbow: Return Appointment in 1 week. Edema Control: Wound #6 Right Calcaneus: Elevate legs to the level of the heart and pump ankles as often as possible Off-Loading: Wound #6 Right Calcaneus: Turn and reposition every 2 hours Other: - sage boots float heels Wound #9 Left Elbow: Turn and reposition every 2 hours Other: - sage boots float heels Additional Orders / Instructions: Wound #6 Right Calcaneus: Increase protein intake. Gina [name], Gina [name] ([id number]) Medications-please add to medication list.: Wound #6 Right Calcaneus: Other: - Vitamin C, Vitamin A, Zinc, Multivitamin Wound #9 Left Elbow: Other: - Vitamin C, Vitamin A, Zinc, Multivitamin I recommended Prisma AG on both wounds and on the left upper arm to apply a foam board and leave it on for the entire week. The right heel cup will be changed every other day and offloading has been emphasized. Her daughter says the nutrition and vitamin supplements are going according to plan. Electronic Signature(s) Signed: 07/29/2015 8:39:59 AM By: Evlyn Kanner MD, FACS Entered By: Evlyn Kanner on 07/29/2015 08:39:59 Gina [name] ([id number]) -------------------------------------------------------------------------------- SuperBill Details Patient Name: Gina [name] Date of Service:  07/29/2015 Medical Record Number: [id number] Patient Account Number: [id number] Date of Birth/Sex: Jul 12, 1917 (82 y.o. Female) Treating RN: Ashok Cordia, Debi Primary Care Physician: Einar Crow Other Clinician: Referring Physician: Einar Crow Treating Physician/Extender: Gina [name] in Treatment: 7 Diagnosis Coding ICD-10 Codes Code Description E11.621 Type 2 diabetes mellitus with foot ulcer L89.613 Pressure ulcer of right heel, stage 3 F03.90 Unspecified dementia without behavioral disturbance S41.112A Laceration without foreign body of left upper arm, initial encounter Facility Procedures CPT4 Code Description: [id number] 11042 - DEB SUBQ TISSUE 20 SQ CM/< ICD-10 Description Diagnosis E11.621 Type 2 diabetes mellitus with foot ulcer L89.613 Pressure ulcer of right heel, stage 3 F03.90 Unspecified dementia without behavioral disturbance  S41.112A Laceration without foreign body of left upper arm, Modifier: initial encoun Quantity: 1 ter Physician Procedures CPT4 Code Description: [id number] - WC PHYS LEVEL 3 - EST PT ICD-10 Description Diagnosis E11.621 Type 2 diabetes mellitus with foot ulcer S41.112A Laceration without foreign body of left upper arm, Modifier: 25 initial encoun Quantity: 1 ter CPT4 Code Description: [id number] 11042 - WC PHYS SUBQ TISS 20 SQ CM ICD-10 Description Diagnosis E11.621 Type 2 diabetes mellitus with foot ulcer L89.613 Pressure ulcer of right heel, stage 3 F03.90 Unspecified dementia without behavioral disturbance  S41.112A Laceration without foreign body of left upper arm, [name], [name]. ([id number])  Modifier: initial encoun Quantity: 1 ter Psychologist, [facility]) Signed: 07/29/2015 8:40:39 AM By: Evlyn Kanner MD, FACS Previous Signature: 07/29/2015 8:40:21 AM Version By: Evlyn Kanner MD, FACS Entered By: Evlyn Kanner on 07/29/2015 08:40:39

## 2015-07-30 NOTE — Progress Notes (Signed)
KINSLER, STARNS ([id number]) Visit Report for 07/29/2015 Arrival Information Details Patient Name: Gina [name], Gina [name]. Date of Service: 07/29/2015 8:00 AM Medical Record Number: [id number] Patient Account Number: [id number] Date of Birth/Sex: [birth year] (82 y.o. Female) Treating RN: Ashok Cordia, Debi Primary Care Physician: Einar Crow Other Clinician: Referring Physician: Einar Crow Treating Physician/Extender: Rudene Re in Treatment: 7 Visit Information History Since Last Visit All ordered tests and consults were completed: No Patient Arrived: Wheel Chair Added or deleted any medications: No Arrival Time: 08:12 Any new allergies or adverse reactions: No Accompanied By: daughter Had a fall or experienced change in No Transfer Assistance: EasyPivot Patient activities of daily living that may affect Lift risk of falls: Patient Identification Verified: Yes Signs or symptoms of abuse/neglect since last No Secondary Verification Yes visito Process Completed: Hospitalized since last visit: No Patient Requires No Pain Present Now: No Transmission-Based Precautions: Patient Has Alerts: Yes Patient Alerts: DM II ABI right noncompressible Electronic Signature(s) Signed: 07/29/2015 4:45:48 PM By: Alejandro Mulling Entered By: Alejandro Mulling on 07/29/2015 08:13:26 Gina [name] ([id number]) -------------------------------------------------------------------------------- Encounter Discharge Information Details Patient Name: Gina [name] Date of Service: 07/29/2015 8:00 AM Medical Record Number: [id number] Patient Account Number: [id number] Date of Birth/Sex: 05/18/1917 (82 y.o. Female) Treating RN: Ashok Cordia, Debi Primary Care Physician: Einar Crow Other Clinician: Referring Physician: Einar Crow Treating Physician/Extender: Rudene Re in Treatment: 7 Encounter Discharge Information Items Discharge Pain Level:  0 Discharge Condition: Stable Ambulatory Status: Wheelchair Discharge Destination: Nursing Home Transportation: Other Accompanied By: Huel Cote Schedule Follow-up Appointment: Yes Medication Reconciliation completed and provided to Patient/Care Yes Shirleen Mcfaul: Provided on Clinical Summary of Care: 07/29/2015 Form Type Recipient Paper Patient LW Electronic Signature(s) Signed: 07/29/2015 9:00:21 AM By: Gwenlyn Perking Entered By: Gwenlyn Perking on 07/29/2015 09:00:21 Gina [name] ([id number]) -------------------------------------------------------------------------------- Lower Extremity Assessment Details Patient Name: Gina [name] Date of Service: 07/29/2015 8:00 AM Medical Record Number: [id number] Patient Account Number: [id number] Date of Birth/Sex: 1917-03-08 (82 y.o. Female) Treating RN: Ashok Cordia, Debi Primary Care Physician: Einar Crow Other Clinician: Referring Physician: Einar Crow Treating Physician/Extender: Rudene Re in Treatment: 7 Vascular Assessment Pulses: Posterior Tibial Dorsalis Pedis Palpable: [Right:Yes] Extremity colors, hair growth, and conditions: Extremity Color: [Right:Normal] Temperature of Extremity: [Right:Warm] Capillary Refill: [Right:< 3 seconds] Toe Nail Assessment Left: Right: Thick: Yes Discolored: Yes Deformed: Yes Improper Length and Hygiene: Yes Electronic Signature(s) Signed: 07/29/2015 4:45:48 PM By: Alejandro Mulling Entered By: Alejandro Mulling on 07/29/2015 12:11:19 Gina [name] ([id number]) -------------------------------------------------------------------------------- Multi Wound Chart Details Patient Name: Gina [name] Date of Service: 07/29/2015 8:00 AM Medical Record Number: [id number] Patient Account Number: [id number] Date of Birth/Sex: 1917-10-31 (82 y.o. 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] [9:Trauma, Other] [N/A:N/A] Secondary Etiology: [6:Pressure Ulcer] [9:N/A] [N/A:N/A] Comorbid History: [6:Cataracts, Chronic sinus problems/congestion, Hypertension, Type II Diabetes, Osteoarthritis, Dementia, Seizure Disorder] [9:Cataracts, Chronic sinus problems/congestion, Hypertension, Type II Diabetes, Osteoarthritis, Dementia,  Seizure Disorder] [N/A:N/A] Date Acquired: [6:02/02/2015] [9:07/28/2015] [N/A:N/A] Weeks of Treatment: [6:7] [9:0] [N/A:N/A] Wound Status: [6:Open] [9:Open] [N/A:N/A] Measurements L x W x D 0.6x1.3x0.2 [9:1x1.8x0.1] [N/A:N/A] (cm) Area (cm) : [6:0.613] [9:1.414] [N/A:N/A] Volume (cm) : [  6:0.123] [9:0.141] [N/A:N/A] % Reduction in Area: [6:86.40%] [9:N/A] [N/A:N/A] % Reduction in Volume: 90.90% [9:N/A] [N/A:N/A] Classification: [6:Grade 1] [9:Partial Thickness] [N/A:N/A] Exudate Amount: [6:Large] [9:Large] [N/A:N/A] Exudate Type: [6:Serous] [9:Serosanguineous] [N/A:N/A] Exudate Color: [6:amber] [9:red, brown] [N/A:N/A] Wound Margin: [6:Thickened] [9:Flat and Intact] [N/A:N/A] Granulation Amount: [6:Small (1-33%)] [9:Large (67-100%)] [N/A:N/A] Granulation Quality: [6:Red] [9:Red] [N/A:N/A] Necrotic Amount: [6:Large (67-100%)] [9:Small (1-33%)] [N/A:N/A] Exposed Structures: [N/A:N/A] Fascia: No Fascia: No Fat: No Fat: No Tendon: No Tendon: No Muscle: No Muscle: No Joint: No Joint: No Bone: No Bone: No Limited to Skin Breakdown Epithelialization:  None None N/A Periwound Skin Texture: No Abnormalities Noted No Abnormalities Noted N/A Periwound Skin Maceration: No Moist: Yes N/A Moisture: Moist: No Dry/Scaly: No Periwound Skin Color: Atrophie Blanche: No No Abnormalities Noted N/A Cyanosis: No Ecchymosis: No Erythema: No Hemosiderin Staining: No Mottled: No Pallor: No Rubor: No Temperature: No Abnormality No Abnormality N/A Tenderness on Yes Yes N/A Palpation: Wound Preparation: Ulcer Cleansing: Ulcer Cleansing: N/A Rinsed/Irrigated with Rinsed/Irrigated with Saline Saline Topical Anesthetic Topical Anesthetic Applied: Other: lidocaine Applied: Other: lidocaine 4% 4% cream Treatment Notes Electronic Signature(s) Signed: 07/29/2015 4:45:48 PM By: Alejandro Mulling Entered By: Alejandro Mulling on 07/29/2015 08:25:10 Gina [name] ([id number]) -------------------------------------------------------------------------------- Multi-Disciplinary Care Plan Details Patient Name: Gina [name] Date of Service: 07/29/2015 8:00 AM Medical Record Number: [id number] Patient Account Number: [id number] Date of Birth/Sex: 03-Dec-1917 (82 y.o. Female) Treating RN: Ashok Cordia, Debi Primary Care Physician: Einar Crow Other Clinician: Referring Physician: Einar Crow Treating Physician/Extender: Rudene Re in Treatment: 7 Active Inactive Abuse / Safety / Falls / Self Care Management Nursing Diagnoses: Potential for falls Goals: Patient will remain injury free Date Initiated: 06/09/2015 Goal Status: Active Interventions: Assess fall risk on admission and as needed Notes: Nutrition Nursing Diagnoses: Imbalanced nutrition Impaired glucose control: actual or potential Potential for alteratiion in Nutrition/Potential for imbalanced nutrition Goals: Patient/caregiver agrees to and verbalizes understanding of need to use nutritional supplements and/or vitamins as prescribed Date Initiated:  06/09/2015 Goal Status: Active Patient/caregiver verbalizes understanding of need to maintain therapeutic glucose control per primary care physician Date Initiated: 06/09/2015 Goal Status: Active Patient/caregiver will maintain therapeutic glucose control Date Initiated: 06/09/2015 Goal Status: Active Interventions: Assess patient nutrition upon admission and as needed per policy LAYNI, KREAMER ([id number]) Notes: Orientation to the Wound Care Program Nursing Diagnoses: Knowledge deficit related to the wound healing center program Goals: Patient/caregiver will verbalize understanding of the Wound Healing Center Program Date Initiated: 06/09/2015 Goal Status: Active Interventions: Provide education on orientation to the wound center Notes: Pain, Acute or Chronic Nursing Diagnoses: Pain, acute or chronic: actual or potential Potential alteration in comfort, pain Goals: Patient will verbalize adequate pain control and receive pain control interventions during procedures as needed Date Initiated: 06/09/2015 Goal Status: Active Interventions: Assess comfort goal upon admission Complete pain assessment as per visit requirements Notes: Pressure Nursing Diagnoses: Knowledge deficit related to causes and risk factors for pressure ulcer development Knowledge deficit related to management of pressures ulcers Goals: Patient will remain free from development of additional pressure ulcers Date Initiated: 06/09/2015 Goal Status: Active Interventions: ACHSAH, MCQUADE ([id number]) Assess: immobility, friction, shearing, incontinence upon admission and as needed Assess offloading mechanisms upon admission and as needed Notes: Wound/Skin Impairment Nursing Diagnoses: Impaired tissue integrity Goals: Ulcer/skin breakdown will have a volume reduction of 30% by week 4 Date Initiated: 06/09/2015 Goal Status: Active Ulcer/skin breakdown will have a volume reduction of 50% by week 8 Date  Initiated: 06/09/2015 Goal Status: Active Ulcer/skin breakdown  will have a volume reduction of 80% by week 12 Date Initiated: 06/09/2015 Goal Status: Active Interventions: Assess ulceration(s) every visit Notes: Electronic Signature(s) Signed: 07/29/2015 4:45:48 PM By: Alejandro Mulling Entered By: Alejandro Mulling on 07/29/2015 08:25:05 Gina [name] ([id number]) -------------------------------------------------------------------------------- Pain Assessment Details Patient Name: Gina [name] Date of Service: 07/29/2015 8:00 AM Medical Record Number: [id number] Patient Account Number: [id number] Date of Birth/Sex: 07-10-1917 (82 y.o. Female) Treating RN: Ashok Cordia, Debi Primary Care Physician: Einar Crow Other Clinician: Referring Physician: Einar Crow Treating Physician/Extender: Rudene Re in Treatment: 7 Active Problems Location of Pain Severity and Description of Pain Patient Has Paino No Site Locations With Dressing Change: No Pain Management and Medication Current Pain Management: Electronic Signature(s) Signed: 07/29/2015 4:45:48 PM By: Alejandro Mulling Entered By: Alejandro Mulling on 07/29/2015 08:13:36 Gina [name] ([id number]) -------------------------------------------------------------------------------- Patient/Caregiver Education Details Patient Name: Gina [name] Date of Service: 07/29/2015 8:00 AM Medical Record Number: [id number] Patient Account Number: [id number] Date of Birth/Gender: 1917/10/03 (82 y.o. Female) Treating RN: Ashok Cordia, Debi Primary Care Physician: Einar Crow Other Clinician: Referring Physician: Einar Crow Treating Physician/Extender: Rudene Re in Treatment: 7 Education Assessment Education Provided To: Patient Education Topics Provided Wound/Skin Impairment: Handouts: Other: CHANGE DRESSING AS ORDERED Methods: Demonstration, Explain/Verbal Responses: State  content correctly Electronic Signature(s) Signed: 07/29/2015 4:45:48 PM By: Alejandro Mulling Entered By: Alejandro Mulling on 07/29/2015 08:38:45 Gina [name] ([id number]) -------------------------------------------------------------------------------- Wound Assessment Details Patient Name: Gina [name] Date of Service: 07/29/2015 8:00 AM Medical Record Number: [id number] Patient Account Number: [id number] Date of Birth/Sex: 04-19-1917 (82 y.o. Female) Treating RN: Ashok Cordia, Debi Primary Care Physician: Einar Crow Other Clinician: Referring Physician: Einar Crow Treating Physician/Extender: Rudene Re in Treatment: 7 Wound Status Wound Number: 6 Primary Diabetic Wound/Ulcer of the Lower Etiology: Extremity Wound Location: Right Calcaneus Secondary Pressure Ulcer Wounding Event: Pressure Injury Etiology: Date Acquired: 02/02/2015 Wound Open Weeks Of Treatment: 7 Status: Clustered Wound: No Comorbid Cataracts, Chronic sinus History: problems/congestion, Hypertension, Type II Diabetes, Osteoarthritis, Dementia, Seizure Disorder Photos Photo Uploaded By: Alejandro Mulling on 07/29/2015 12:09:10 Wound Measurements Length: (cm) 0.6 % Reduction in Width: (cm) 1.3 % Reduction in Depth: (cm) 0.2 Epithelializati Area: (cm) 0.613 Tunneling: Volume: (cm) 0.123 Undermining: Area: 86.4% Volume: 90.9% on: None No No Wound Description Classification: Grade 1 Wound Margin: Thickened Exudate Amount: Large Exudate Type: Serous Exudate Color: amber Foul Odor After Cleansing: No Wound Bed Granulation Amount: Small (1-33%) Exposed Structure KATALEYA, ZAUGG ([id number]) Granulation Quality: Red Fascia Exposed: No Necrotic Amount: Large (67-100%) Fat Layer Exposed: No Necrotic Quality: Adherent Slough Tendon Exposed: No Muscle Exposed: No Joint Exposed: No Bone Exposed: No Periwound Skin Texture Texture Color No Abnormalities  Noted: Yes No Abnormalities Noted: No Atrophie Blanche: No Moisture Cyanosis: No No Abnormalities Noted: No Ecchymosis: No Dry / Scaly: No Erythema: No Maceration: No Hemosiderin Staining: No Moist: No Mottled: No Pallor: No Rubor: No Temperature / Pain Temperature: No Abnormality Tenderness on Palpation: Yes Wound Preparation Ulcer Cleansing: Rinsed/Irrigated with Saline Topical Anesthetic Applied: Other: lidocaine 4%, Treatment Notes Wound #6 (Right Calcaneus) 1. Cleansed with: Clean wound with Normal Saline 2. Anesthetic Topical Lidocaine 4% cream to wound bed prior to debridement 3. Peri-wound Care: Barrier cream 4. Dressing Applied: Prisma Ag 5. Secondary Dressing Applied Dry Gauze Foam Kerlix/Conform 7. Secured with Tape Notes netting; heel offloading boot Electronic Signature(s) SHANIRA, TINE ([id number]) Signed: 07/29/2015 4:45:48 PM By: Alejandro Mulling Entered By: Alejandro Mulling on 07/29/2015 08:24:14 Gina [name] ([id number]) -------------------------------------------------------------------------------- Wound Assessment  Details Patient Name: ANNDREA, MIHELICH. Date of Service: 07/29/2015 8:00 AM Medical Record Number: [id number] Patient Account Number: [id number] Date of Birth/Sex: November 25, 1917 (82 y.o. Female) Treating RN: Ashok Cordia, Debi Primary Care Physician: Einar Crow Other Clinician: Referring Physician: Einar Crow Treating Physician/Extender: Rudene Re in Treatment: 7 Wound Status Wound Number: 9 Primary Trauma, Other Etiology: Wound Location: Left Elbow Wound Open Wounding Event: Trauma Status: Date Acquired: 07/28/2015 Comorbid Cataracts, Chronic sinus Weeks Of Treatment: 0 History: problems/congestion, Hypertension, Clustered Wound: No Type II Diabetes, Osteoarthritis, Dementia, Seizure Disorder Photos Photo Uploaded By: Alejandro Mulling on 07/29/2015 12:09:11 Wound  Measurements Length: (cm) 1 Width: (cm) 1.8 Depth: (cm) 0.1 Area: (cm) 1.414 Volume: (cm) 0.141 % Reduction in Area: % Reduction in Volume: Epithelialization: None Tunneling: No Undermining: No Wound Description Classification: Partial Thickness Wound Margin: Flat and Intact Exudate Amount: Large Exudate Type: Serosanguineous Exudate Color: red, brown Foul Odor After Cleansing: No Wound Bed Granulation Amount: Large (67-100%) Exposed Structure Granulation Quality: Red Fascia Exposed: No Necrotic Amount: Small (1-33%) Fat Layer Exposed: No MAKENSIE, MULHALL ([id number]) Necrotic Quality: Adherent Slough Tendon Exposed: No Muscle Exposed: No Joint Exposed: No Bone Exposed: No Limited to Skin Breakdown Periwound Skin Texture Texture Color No Abnormalities Noted: No No Abnormalities Noted: No Moisture Temperature / Pain No Abnormalities Noted: No Temperature: No Abnormality Moist: Yes Tenderness on Palpation: Yes Wound Preparation Ulcer Cleansing: Rinsed/Irrigated with Saline Topical Anesthetic Applied: Other: lidocaine 4% cream, Treatment Notes Wound #9 (Left Elbow) 1. Cleansed with: Clean wound with Normal Saline 2. Anesthetic Topical Lidocaine 4% cream to wound bed prior to debridement 4. Dressing Applied: Prisma Ag 5. Secondary Dressing Applied Bordered Foam Dressing Kerlix/Conform 7. Secured with Secretary/administrator) Signed: 07/29/2015 4:45:48 PM By: Alejandro Mulling Entered By: Alejandro Mulling on 07/29/2015 08:21:55 Gina [name] ([id number]) -------------------------------------------------------------------------------- Vitals Details Patient Name: Gina [name] Date of Service: 07/29/2015 8:00 AM Medical Record Number: [id number] Patient Account Number: [id number] Date of Birth/Sex: May 07, 1917 (82 y.o. Female) Treating RN: Ashok Cordia, Debi Primary Care Physician: Einar Crow Other Clinician: Referring Physician:  Einar Crow Treating Physician/Extender: Rudene Re in Treatment: 7 Vital Signs Time Taken: 08:13 Temperature (F): 98.4 Height (in): 63 Pulse (bpm): 79 Weight (lbs): 142 Respiratory Rate (breaths/min): 16 Body Mass Index (BMI): 25.2 Blood Pressure (mmHg): 144/67 Reference Range: 80 - 120 mg / dl Electronic Signature(s) Signed: 07/29/2015 4:45:48 PM By: Alejandro Mulling Entered By: Alejandro Mulling on 07/29/2015 08:15:27

## 2015-08-01 DIAGNOSIS — R4182 Altered mental status, unspecified: Secondary | ICD-10-CM | POA: Diagnosis not present

## 2015-08-04 ENCOUNTER — Encounter: Payer: PPO | Attending: Surgery | Admitting: Surgery

## 2015-08-04 DIAGNOSIS — I129 Hypertensive chronic kidney disease with stage 1 through stage 4 chronic kidney disease, or unspecified chronic kidney disease: Secondary | ICD-10-CM | POA: Diagnosis not present

## 2015-08-04 DIAGNOSIS — K219 Gastro-esophageal reflux disease without esophagitis: Secondary | ICD-10-CM | POA: Diagnosis not present

## 2015-08-04 DIAGNOSIS — L89613 Pressure ulcer of right heel, stage 3: Secondary | ICD-10-CM | POA: Diagnosis not present

## 2015-08-04 DIAGNOSIS — H2512 Age-related nuclear cataract, left eye: Secondary | ICD-10-CM | POA: Diagnosis not present

## 2015-08-04 DIAGNOSIS — F0390 Unspecified dementia without behavioral disturbance: Secondary | ICD-10-CM | POA: Insufficient documentation

## 2015-08-04 DIAGNOSIS — M810 Age-related osteoporosis without current pathological fracture: Secondary | ICD-10-CM | POA: Diagnosis not present

## 2015-08-04 DIAGNOSIS — X58XXXA Exposure to other specified factors, initial encounter: Secondary | ICD-10-CM | POA: Insufficient documentation

## 2015-08-04 DIAGNOSIS — E039 Hypothyroidism, unspecified: Secondary | ICD-10-CM | POA: Diagnosis not present

## 2015-08-04 DIAGNOSIS — E7800 Pure hypercholesterolemia, unspecified: Secondary | ICD-10-CM | POA: Insufficient documentation

## 2015-08-04 DIAGNOSIS — S41112A Laceration without foreign body of left upper arm, initial encounter: Secondary | ICD-10-CM | POA: Diagnosis not present

## 2015-08-04 DIAGNOSIS — E11621 Type 2 diabetes mellitus with foot ulcer: Secondary | ICD-10-CM | POA: Diagnosis not present

## 2015-08-04 DIAGNOSIS — E1122 Type 2 diabetes mellitus with diabetic chronic kidney disease: Secondary | ICD-10-CM | POA: Diagnosis not present

## 2015-08-04 DIAGNOSIS — L97411 Non-pressure chronic ulcer of right heel and midfoot limited to breakdown of skin: Secondary | ICD-10-CM | POA: Diagnosis not present

## 2015-08-04 DIAGNOSIS — N189 Chronic kidney disease, unspecified: Secondary | ICD-10-CM | POA: Diagnosis not present

## 2015-08-04 DIAGNOSIS — E119 Type 2 diabetes mellitus without complications: Secondary | ICD-10-CM | POA: Diagnosis not present

## 2015-08-04 DIAGNOSIS — Z961 Presence of intraocular lens: Secondary | ICD-10-CM | POA: Diagnosis not present

## 2015-08-05 ENCOUNTER — Ambulatory Visit: Payer: PPO | Admitting: Surgery

## 2015-08-05 NOTE — Progress Notes (Signed)
[name], [name] ([id number]) Visit Report for 08/04/2015 Chief Complaint Document Details Patient Name: Gina [name], Gina [name]. Date of Service: 08/04/2015 8:00 AM Medical Record Number: [id number] Patient Account Number: [id number] Date of Birth/Sex: Apr 12, 1917 (82 y.o. Female) Treating RN: Curtis Sites Primary Care Physician: Einar Crow Other Clinician: Referring Physician: Einar Crow Treating Physician/Extender: Rudene Re in Treatment: 8 Information Obtained from: Patient Chief Complaint Patient is at the clinic for treatment of an open pressure ulcer to the right heel which she's had for about 4 months Electronic Signature(s) Signed: 08/04/2015 8:41:51 AM By: Evlyn Kanner MD, FACS Entered By: Evlyn Kanner on 08/04/2015 08:41:51 Gina [name] ([id number]) -------------------------------------------------------------------------------- Debridement Details Patient Name: Gina [name] Date of Service: 08/04/2015 8:00 AM Medical Record Number: [id number] Patient Account Number: [id number] Date of Birth/Sex: 12/31/1917 (82 y.o. Female) Treating RN: Curtis Sites Primary Care Physician: Einar Crow Other Clinician: Referring Physician: Einar Crow Treating Physician/Extender: Rudene Re in Treatment: 8 Debridement Performed for Wound #6 Right Calcaneus Assessment: Performed By: Physician Evlyn Kanner, MD Debridement: Debridement Pre-procedure Yes Verification/Time Out Taken: Start Time: 08:36 Pain Control: Lidocaine 4% Topical Solution Level: Skin/Subcutaneous Tissue Total Area Debrided (L x 0.5 (cm) x 1.1 (cm) = 0.55 (cm) W): Tissue and other Non-Viable, Eschar, Fibrin/Slough, Subcutaneous material debrided: Instrument: Curette Bleeding: Minimum Hemostasis Achieved: Pressure End Time: 08:40 Procedural Pain: 0 Post Procedural Pain: 0 Response to Treatment: Procedure was tolerated well Post Debridement  Measurements of Total Wound Length: (cm) 0.5 Width: (cm) 1 Depth: (cm) 0.1 Volume: (cm) 0.039 Post Procedure Diagnosis Same as Pre-procedure Electronic Signature(s) Signed: 08/04/2015 8:41:42 AM By: Evlyn Kanner MD, FACS Signed: 08/04/2015 4:49:27 PM By: Curtis Sites Entered By: Evlyn Kanner on 08/04/2015 08:41:42 Gina [name] ([id number]) -------------------------------------------------------------------------------- HPI Details Patient Name: Gina [name] Date of Service: 08/04/2015 8:00 AM Medical Record Number: [id number] Patient Account Number: [id number] Date of Birth/Sex: June 25, 1917 (82 y.o. Female) Treating RN: Curtis Sites Primary Care Physician: Einar Crow Other Clinician: Referring Physician: Einar Crow Treating Physician/Extender: Rudene Re in Treatment: 8 History of Present Illness Location: injury to the right heel with drainage Quality: Patient reports No Pain. Severity: Patient states wound are getting worse. Duration: Patient has had the wound for > 4 months prior to seeking treatment at the wound center Timing: Pain in wound is Intermittent (comes and goes Context: The wound appeared gradually over time Modifying Factors: Other treatment(s) tried include:was seen by her PCP and put on Keflex recently but the dose is complete Associated Signs and Symptoms: Patient reports having foul odor. HPI Description: 82 year old patient who is a resident of a nursing home who comes to see as with a decubitus ulcer of the right heel. He has a past medical history of diabetes mellitus, hypercholesterolemia, hypothyroidism, GERD, hypertension, dementia, chronic kidney disease and age-related osteoporosis. She is status post cholecystectomy, hysterectomy, back fusion, debridement of necrotic ulcer right thigh in February 2017. She has never been a smoker. He has been seen at our wound center in the past and was treated in 2015. She  was recently seen by her PCP Dr. Einar Crow, for a heel ulcer and he had recommend Keflex and local dressing. 06/17/2015 -- grew Escherichia coli which was treated by Dr. Leanord Hawking with Keflex 500 mg by mouth 3 times a day for 7 days. Culture results were noted -- wound culture grew Escherichia coli sensitive to Bactrim, ampicillin, ampicillin sulbactam, Kefzol and, cefepime. 06/24/2015 -- x-ray of the right foot was done and this showed no  evidence of osteomyelitis. Electronic Signature(s) Signed: 08/04/2015 8:41:57 AM By: Evlyn Kanner MD, FACS Entered By: Evlyn Kanner on 08/04/2015 08:41:57 Gina [name] ([id number]) -------------------------------------------------------------------------------- Physical Exam Details Patient Name: Gina [name] Date of Service: 08/04/2015 8:00 AM Medical Record Number: [id number] Patient Account Number: [id number] Date of Birth/Sex: 1917-09-07 (82 y.o. Female) Treating RN: Curtis Sites Primary Care Physician: Einar Crow Other Clinician: Referring Physician: Einar Crow Treating Physician/Extender: Rudene Re in Treatment: 8 Constitutional . Pulse regular. Respirations normal and unlabored. Afebrile. . Eyes Nonicteric. Reactive to light. Ears, Nose, Mouth, and Throat Lips, teeth, and gums WNL.Marland Kitchen Moist mucosa without lesions. Neck supple and nontender. No palpable supraclavicular or cervical adenopathy. Normal sized without goiter. Respiratory WNL. No retractions.. Cardiovascular Pedal Pulses WNL. No clubbing, cyanosis or edema. Lymphatic No adneopathy. No adenopathy. No adenopathy. Musculoskeletal Adexa without tenderness or enlargement.. Digits and nails w/o clubbing, cyanosis, infection, petechiae, ischemia, or inflammatory conditions.. Integumentary (Hair, Skin) No suspicious lesions. No crepitus or fluctuance. No peri-wound warmth or erythema. No masses.Marland Kitchen Psychiatric Judgement and insight Intact..  No evidence of depression, anxiety, or agitation.. Notes the wound on the left arm is completely healed and no debridement was required. The right calcaneal wound has subcutaneous debris but once this was sharply removed there is healthy granulation tissue and no evidence of inflammation. There was no bleeding. Electronic Signature(s) Signed: 08/04/2015 8:42:49 AM By: Evlyn Kanner MD, FACS Entered By: Evlyn Kanner on 08/04/2015 08:42:49 Gina [name] ([id number]) -------------------------------------------------------------------------------- Physician Orders Details Patient Name: Gina [name] Date of Service: 08/04/2015 8:00 AM Medical Record Patient Account Number: [id number] Number: Afful, RN, BSN, Treating RN: [birth year] (82 y.o. [place] Date of Birth/Sex: Female) Other Clinician: Primary Care Physician: Einar Crow Treating Evlyn Kanner Referring Physician: Einar Crow Physician/Extender: Tania Ade in Treatment: 8 Verbal / Phone Orders: Yes Clinician: Afful, RN, BSN, Rita Read Back and Verified: Yes Diagnosis Coding Wound Cleansing Wound #6 Right Calcaneus o Clean wound with Normal Saline. o Cleanse wound with mild soap and water Anesthetic Wound #6 Right Calcaneus o Topical Lidocaine 4% cream applied to wound bed prior to debridement - for clinic use Skin Barriers/Peri-Wound Care Wound #6 Right Calcaneus o Barrier cream - zinc Primary Wound Dressing Wound #6 Right Calcaneus o Prisma Ag - moisten with saline Secondary Dressing Wound #6 Right Calcaneus o Gauze, ABD and Kerlix/Conform - netting, tape o Foam Dressing Change Frequency Wound #6 Right Calcaneus o Change dressing every other day. Follow-up Appointments Wound #6 Right Calcaneus o Return Appointment in 1 week. Edema Control Wound #6 Right Calcaneus o Elevate legs to the level of the heart and pump ankles as often as possible Gina [name], Gina [name].  ([id number]) Off-Loading Wound #6 Right Calcaneus o Turn and reposition every 2 hours o Other: - sage boots float heels Additional Orders / Instructions Wound #6 Right Calcaneus o Increase protein intake. Medications-please add to medication list. Wound #6 Right Calcaneus o Other: - Vitamin C, Vitamin A, Zinc, Multivitamin Electronic Signature(s) Signed: 08/04/2015 4:00:53 PM By: Elpidio Eric BSN, RN Signed: 08/04/2015 4:34:24 PM By: Evlyn Kanner MD, FACS Entered By: Elpidio Eric on 08/04/2015 08:39:08 Gina [name] ([id number]) -------------------------------------------------------------------------------- Problem List Details Patient Name: Gina [name] Date of Service: 08/04/2015 8:00 AM Medical Record Number: [id number] Patient Account Number: [id number] Date of Birth/Sex: 1917/12/24 (82 y.o. Female) Treating RN: Curtis Sites Primary Care Physician: Einar Crow Other Clinician: Referring Physician: Einar Crow Treating Physician/Extender: Rudene Re in Treatment: 8 Active Problems ICD-10 Encounter Code Description Active  Date Diagnosis E11.621 Type 2 diabetes mellitus with foot ulcer 06/09/2015 Yes L89.613 Pressure ulcer of right heel, stage 3 06/09/2015 Yes F03.90 Unspecified dementia without behavioral disturbance 06/09/2015 Yes S41.112A Laceration without foreign body of left upper arm, initial 07/29/2015 Yes encounter Inactive Problems Resolved Problems Electronic Signature(s) Signed: 08/04/2015 8:41:34 AM By: Evlyn Kanner MD, FACS Entered By: Evlyn Kanner on 08/04/2015 08:41:33 Gina [name] ([id number]) -------------------------------------------------------------------------------- Progress Note Details Patient Name: Gina [name] Date of Service: 08/04/2015 8:00 AM Medical Record Number: [id number] Patient Account Number: [id number] Date of Birth/Sex: 1917-09-14 (82 y.o. Female) Treating RN: Curtis Sites Primary Care Physician: Einar Crow Other Clinician: Referring Physician: Einar Crow Treating Physician/Extender: Rudene Re in Treatment: 8 Subjective Chief Complaint Information obtained from Patient Patient is at the clinic for treatment of an open pressure ulcer to the right heel which she's had for about 4 months History of Present Illness (HPI) The following HPI elements were documented for the patient's wound: Location: injury to the right heel with drainage Quality: Patient reports No Pain. Severity: Patient states wound are getting worse. Duration: Patient has had the wound for > 4 months prior to seeking treatment at the wound center Timing: Pain in wound is Intermittent (comes and goes Context: The wound appeared gradually over time Modifying Factors: Other treatment(s) tried include:was seen by her PCP and put on Keflex recently but the dose is complete Associated Signs and Symptoms: Patient reports having foul odor. 82 year old patient who is a resident of a nursing home who comes to see as with a decubitus ulcer of the right heel. He has a past medical history of diabetes mellitus, hypercholesterolemia, hypothyroidism, GERD, hypertension, dementia, chronic kidney disease and age-related osteoporosis. She is status post cholecystectomy, hysterectomy, back fusion, debridement of necrotic ulcer right thigh in February 2017. She has never been a smoker. He has been seen at our wound center in the past and was treated in 2015. She was recently seen by her PCP Dr. Einar Crow, for a heel ulcer and he had recommend Keflex and local dressing. 06/17/2015 -- grew Escherichia coli which was treated by Dr. Leanord Hawking with Keflex 500 mg by mouth 3 times a day for 7 days. Culture results were noted -- wound culture grew Escherichia coli sensitive to Bactrim, ampicillin, ampicillin sulbactam, Kefzol and, cefepime. 06/24/2015 -- x-ray of the right foot  was done and this showed no evidence of osteomyelitis. Objective Gina [name], Gina [name] ([id number]) Constitutional Pulse regular. Respirations normal and unlabored. Afebrile. Vitals Time Taken: 8:14 AM, Height: 63 in, Weight: 142 lbs, BMI: 25.2, Temperature: 98.2 F, Pulse: 72 bpm, Respiratory Rate: 16 breaths/min, Blood Pressure: 154/66 mmHg. Eyes Nonicteric. Reactive to light. Ears, Nose, Mouth, and Throat Lips, teeth, and gums WNL.Marland Kitchen Moist mucosa without lesions. Neck supple and nontender. No palpable supraclavicular or cervical adenopathy. Normal sized without goiter. Respiratory WNL. No retractions.. Cardiovascular Pedal Pulses WNL. No clubbing, cyanosis or edema. Lymphatic No adneopathy. No adenopathy. No adenopathy. Musculoskeletal Adexa without tenderness or enlargement.. Digits and nails w/o clubbing, cyanosis, infection, petechiae, ischemia, or inflammatory conditions.Marland Kitchen Psychiatric Judgement and insight Intact.. No evidence of depression, anxiety, or agitation.. General Notes: the wound on the left arm is completely healed and no debridement was required. The right calcaneal wound has subcutaneous debris but once this was sharply removed there is healthy granulation tissue and no evidence of inflammation. There was no bleeding. Integumentary (Hair, Skin) No suspicious lesions. No crepitus or fluctuance. No peri-wound warmth or erythema. No masses.. Wound #6  status is Open. Original cause of wound was Pressure Injury. The wound is located on the Right Calcaneus. The wound measures 0.5cm length x 1.1cm width x 0.1cm depth; 0.432cm^2 area and 0.043cm^3 volume. There is no tunneling or undermining noted. There is a large amount of serous drainage noted. The wound margin is thickened. There is small (1-33%) red granulation within the wound bed. There is a large (67-100%) amount of necrotic tissue within the wound bed including Adherent Slough. The periwound skin appearance did  not exhibit: Callus, Crepitus, Excoriation, Fluctuance, Friable, Induration, Localized Edema, Rash, Scarring, Dry/Scaly, Maceration, Moist, Atrophie Blanche, Cyanosis, Ecchymosis, Hemosiderin Staining, Mottled, Pallor, Rubor, Erythema. Periwound temperature was noted as No Abnormality. Gina [name], Gina [name] ([id number]) Wound #9 status is Healed - Epithelialized. Original cause of wound was Trauma. The wound is located on the Left Elbow. The wound measures 0cm length x 0cm width x 0cm depth; 0cm^2 area and 0cm^3 volume. The wound is limited to skin breakdown. There is no tunneling or undermining noted. There is a none present amount of drainage noted. The wound margin is flat and intact. There is no granulation within the wound bed. There is no necrotic tissue within the wound bed. The periwound skin appearance exhibited: Moist. Periwound temperature was noted as No Abnormality. The periwound has tenderness on palpation. Assessment Active Problems ICD-10 E11.621 - Type 2 diabetes mellitus with foot ulcer L89.613 - Pressure ulcer of right heel, stage 3 F03.90 - Unspecified dementia without behavioral disturbance S41.112A - Laceration without foreign body of left upper arm, initial encounter Procedures Wound #6 Wound #6 is a Diabetic Wound/Ulcer of the Lower Extremity located on the Right Calcaneus . There was a Skin/Subcutaneous Tissue Debridement ([id number]) debridement with total area of 0.55 sq cm performed by Evlyn Kanner, MD. with the following instrument(s): Curette to remove Non-Viable tissue/material including Fibrin/Slough, Eschar, and Subcutaneous after achieving pain control using Lidocaine 4% Topical Solution. A time out was conducted prior to the start of the procedure. A Minimum amount of bleeding was controlled with Pressure. The procedure was tolerated well with a pain level of 0 throughout and a pain level of 0 following the procedure. Post Debridement Measurements: 0.5cm  length x 1cm width x 0.1cm depth; 0.039cm^3 volume. Post procedure Diagnosis Wound #6: Same as Pre-Procedure Plan Wound Cleansing: Wound #6 Right Calcaneus: Clean wound with Normal Saline. Cleanse wound with mild soap and water Gina [name], Gina [name] ([id number]) Anesthetic: Wound #6 Right Calcaneus: Topical Lidocaine 4% cream applied to wound bed prior to debridement - for clinic use Skin Barriers/Peri-Wound Care: Wound #6 Right Calcaneus: Barrier cream - zinc Primary Wound Dressing: Wound #6 Right Calcaneus: Prisma Ag - moisten with saline Secondary Dressing: Wound #6 Right Calcaneus: Gauze, ABD and Kerlix/Conform - netting, tape Foam Dressing Change Frequency: Wound #6 Right Calcaneus: Change dressing every other day. Follow-up Appointments: Wound #6 Right Calcaneus: Return Appointment in 1 week. Edema Control: Wound #6 Right Calcaneus: Elevate legs to the level of the heart and pump ankles as often as possible Off-Loading: Wound #6 Right Calcaneus: Turn and reposition every 2 hours Other: - sage boots float heels Additional Orders / Instructions: Wound #6 Right Calcaneus: Increase protein intake. Medications-please add to medication list.: Wound #6 Right Calcaneus: Other: - Vitamin C, Vitamin A, Zinc, Multivitamin the left upper arm will be protected with a form border. The right heel will continue with Prisma AG and a bordered foam and off loading and nutrition has been discussed in great detail. She will come  back to see me next week. Electronic Signature(s) Signed: 08/04/2015 8:43:25 AM By: Evlyn Kanner MD, FACS Entered By: Evlyn Kanner on 08/04/2015 08:43:25 Gina [name] ([id number]) -------------------------------------------------------------------------------- SuperBill Details Patient Name: Gina [name] Date of Service: 08/04/2015 Medical Record Number: [id number] Patient Account Number: [id number] Date of Birth/Sex: [birth year] (82 y.o.  Female) Treating RN: Curtis Sites Primary Care Physician: Einar Crow Other Clinician: Referring Physician: Einar Crow Treating Physician/Extender: Rudene Re in Treatment: 8 Diagnosis Coding ICD-10 Codes Code Description E11.621 Type 2 diabetes mellitus with foot ulcer L89.613 Pressure ulcer of right heel, stage 3 F03.90 Unspecified dementia without behavioral disturbance S41.112A Laceration without foreign body of left upper arm, initial encounter Facility Procedures CPT4 Code Description: [id number] 11042 - DEB SUBQ TISSUE 20 SQ CM/< ICD-10 Description Diagnosis E11.621 Type 2 diabetes mellitus with foot ulcer L89.613 Pressure ulcer of right heel, stage 3 F03.90 Unspecified dementia without behavioral disturbance  S41.112A Laceration without foreign body of left upper arm, Modifier: initial encoun Quantity: 1 ter Physician Procedures CPT4 Code Description: [id number] 11042 - WC PHYS SUBQ TISS 20 SQ CM ICD-10 Description Diagnosis E11.621 Type 2 diabetes mellitus with foot ulcer L89.613 Pressure ulcer of right heel, stage 3 F03.90 Unspecified dementia without behavioral disturbance  S41.112A Laceration without foreign body of left upper arm, Modifier: initial encoun Quantity: 1 ter Electronic Signature(s) Signed: 08/04/2015 8:43:38 AM By: Evlyn Kanner MD, FACS Entered By: Evlyn Kanner on 08/04/2015 08:43:38

## 2015-08-05 NOTE — Progress Notes (Signed)
PAHOUA, SCHREINER ([id number]) Visit Report for 08/04/2015 Arrival Information Details Patient Name: Gina [name], Gina [name]. Date of Service: 08/04/2015 8:00 AM Medical Record Number: [id number] Patient Account Number: [id number] Date of Birth/Sex: 08-27-1917 (82 y.o. Female) Treating RN: Curtis Sites Primary Care Physician: Einar Crow Other Clinician: Referring Physician: Einar Crow Treating Physician/Extender: Rudene Re in Treatment: 8 Visit Information History Since Last Visit Added or deleted any medications: No Patient Arrived: Wheel Chair Any new allergies or adverse reactions: No Arrival Time: 08:10 Had a fall or experienced change in No Accompanied By: dtr activities of daily living that may affect Transfer Assistance: Manual risk of falls: Patient Identification Verified: Yes Signs or symptoms of abuse/neglect since last No Secondary Verification Yes visito Process Completed: Hospitalized since last visit: No Patient Requires No Pain Present Now: No Transmission-Based Precautions: Patient Has Alerts: Yes Patient Alerts: DM II ABI right noncompressible Electronic Signature(s) Signed: 08/04/2015 4:49:27 PM By: Curtis Sites Entered By: Curtis Sites on 08/04/2015 08:14:37 Gina [name] ([id number]) -------------------------------------------------------------------------------- Encounter Discharge Information Details Patient Name: Gina [name] Date of Service: 08/04/2015 8:00 AM Medical Record Number: [id number] Patient Account Number: [id number] Date of Birth/Sex: [birth year] (82 y.o. Female) Treating RN: Curtis Sites Primary Care Physician: Einar Crow Other Clinician: Referring Physician: Einar Crow Treating Physician/Extender: Rudene Re in Treatment: 8 Encounter Discharge Information Items Discharge Pain Level: 0 Discharge Condition: Stable Ambulatory Status: Wheelchair Discharge  Destination: Nursing Home Transportation: Private Auto Accompanied By: dtr Schedule Follow-up Appointment: Yes Medication Reconciliation completed No and provided to Patient/Care Louvinia Cumbo: Provided on Clinical Summary of Care: 08/04/2015 Form Type Recipient Paper Patient LW Electronic Signature(s) Signed: 08/04/2015 4:49:27 PM By: Curtis Sites Previous Signature: 08/04/2015 8:48:57 AM Version By: Gwenlyn Perking Entered By: Curtis Sites on 08/04/2015 08:56:11 Gina [name] ([id number]) -------------------------------------------------------------------------------- Multi Wound Chart Details Patient Name: Gina [name] Date of Service: 08/04/2015 8:00 AM Medical Record Number: [id number] Patient Account Number: [id number] Date of Birth/Sex: 1917-05-13 (82 y.o. Female) Treating RN: Clover Mealy, RN, BSN, [place] Primary Care Physician: Einar Crow Other Clinician: Referring Physician: Einar Crow Treating Physician/Extender: Rudene Re in Treatment: 8 Vital Signs Height(in): 63 Pulse(bpm): 72 Weight(lbs): 142 Blood Pressure 154/66 (mmHg): Body Mass Index(BMI): 25 Temperature(F): 98.2 Respiratory Rate 16 (breaths/min): Photos: [N/A:N/A] Wound Location: Right Calcaneus Left Elbow N/A Wounding Event: Pressure Injury Trauma N/A Primary Etiology: Diabetic Wound/Ulcer of Trauma, Other N/A the Lower Extremity Secondary Etiology: Pressure Ulcer N/A N/A Comorbid History: Cataracts, Chronic sinus Cataracts, Chronic sinus N/A problems/congestion, problems/congestion, Hypertension, Type II Hypertension, Type II Diabetes, Osteoarthritis, Diabetes, Osteoarthritis, Dementia, Seizure Dementia, Seizure Disorder Disorder Date Acquired: 02/02/2015 07/28/2015 N/A Weeks of Treatment: 8 0 N/A Wound Status: Open Healed - Epithelialized N/A Measurements L x W x D 0.5x1.1x0.1 0x0x0 N/A (cm) Area (cm) : 0.432 0 N/A Volume (cm) : 0.043 0 N/A % Reduction in Area:  90.40% 100.00% N/A % Reduction in Volume: 96.80% 100.00% N/A Classification: Grade 1 Partial Thickness N/A Exudate Amount: Large None Present N/A Exudate Type: Serous N/A N/A VERENIS, NICOSIA ([id number]) Exudate Color: amber N/A N/A Wound Margin: Thickened Flat and Intact N/A Granulation Amount: Small (1-33%) None Present (0%) N/A Granulation Quality: Red N/A N/A Necrotic Amount: Large (67-100%) None Present (0%) N/A Exposed Structures: Fascia: No Fascia: No N/A Fat: No Fat: No Tendon: No Tendon: No Muscle: No Muscle: No Joint: No Joint: No Bone: No Bone: No Limited to Skin Breakdown Epithelialization: None Large (67-100%) N/A Periwound Skin Texture: Edema: No No Abnormalities Noted N/A Excoriation: No  Induration: No Callus: No Crepitus: No Fluctuance: No Friable: No Rash: No Scarring: No Periwound Skin Maceration: No Moist: Yes N/A Moisture: Moist: No Dry/Scaly: No Periwound Skin Color: Atrophie Blanche: No No Abnormalities Noted N/A Cyanosis: No Ecchymosis: No Erythema: No Hemosiderin Staining: No Mottled: No Pallor: No Rubor: No Temperature: No Abnormality No Abnormality N/A Tenderness on No Yes N/A Palpation: Wound Preparation: Ulcer Cleansing: Ulcer Cleansing: N/A Rinsed/Irrigated with Rinsed/Irrigated with Saline Saline Topical Anesthetic Topical Anesthetic Applied: Other: lidocaine Applied: None 4% Treatment Notes Electronic Signature(s) Signed: 08/04/2015 4:00:53 PM By: Elpidio Eric BSN, RN AUBREANNA, PERCLE ([id number]) Entered By: Elpidio Eric on 08/04/2015 08:36:49 Gina [name] ([id number]) -------------------------------------------------------------------------------- Multi-Disciplinary Care Plan Details Patient Name: Gina [name] Date of Service: 08/04/2015 8:00 AM Medical Record Number: [id number] Patient Account Number: [id number] Date of Birth/Sex: 07-06-1917 (82 y.o. Female) Treating RN: Afful, RN, BSN,  Rita Primary Care Physician: Einar Crow Other Clinician: Referring Physician: Einar Crow Treating Physician/Extender: Rudene Re in Treatment: 8 Active Inactive Abuse / Safety / Falls / Self Care Management Nursing Diagnoses: Potential for falls Goals: Patient will remain injury free Date Initiated: 06/09/2015 Goal Status: Active Interventions: Assess fall risk on admission and as needed Notes: Nutrition Nursing Diagnoses: Imbalanced nutrition Impaired glucose control: actual or potential Potential for alteratiion in Nutrition/Potential for imbalanced nutrition Goals: Patient/caregiver agrees to and verbalizes understanding of need to use nutritional supplements and/or vitamins as prescribed Date Initiated: 06/09/2015 Goal Status: Active Patient/caregiver verbalizes understanding of need to maintain therapeutic glucose control per primary care physician Date Initiated: 06/09/2015 Goal Status: Active Patient/caregiver will maintain therapeutic glucose control Date Initiated: 06/09/2015 Goal Status: Active Interventions: Assess patient nutrition upon admission and as needed per policy HYDEE, FLEECE ([id number]) Notes: Orientation to the Wound Care Program Nursing Diagnoses: Knowledge deficit related to the wound healing center program Goals: Patient/caregiver will verbalize understanding of the Wound Healing Center Program Date Initiated: 06/09/2015 Goal Status: Active Interventions: Provide education on orientation to the wound center Notes: Pain, Acute or Chronic Nursing Diagnoses: Pain, acute or chronic: actual or potential Potential alteration in comfort, pain Goals: Patient will verbalize adequate pain control and receive pain control interventions during procedures as needed Date Initiated: 06/09/2015 Goal Status: Active Interventions: Assess comfort goal upon admission Complete pain assessment as per visit  requirements Notes: Pressure Nursing Diagnoses: Knowledge deficit related to causes and risk factors for pressure ulcer development Knowledge deficit related to management of pressures ulcers Goals: Patient will remain free from development of additional pressure ulcers Date Initiated: 06/09/2015 Goal Status: Active Interventions: MAGEN, SURIANO ([id number]) Assess: immobility, friction, shearing, incontinence upon admission and as needed Assess offloading mechanisms upon admission and as needed Notes: Wound/Skin Impairment Nursing Diagnoses: Impaired tissue integrity Goals: Ulcer/skin breakdown will have a volume reduction of 30% by week 4 Date Initiated: 06/09/2015 Goal Status: Active Ulcer/skin breakdown will have a volume reduction of 50% by week 8 Date Initiated: 06/09/2015 Goal Status: Active Ulcer/skin breakdown will have a volume reduction of 80% by week 12 Date Initiated: 06/09/2015 Goal Status: Active Interventions: Assess ulceration(s) every visit Notes: Electronic Signature(s) Signed: 08/04/2015 4:00:53 PM By: Elpidio Eric BSN, RN Entered By: Elpidio Eric on 08/04/2015 08:36:36 Gina [name] ([id number]) -------------------------------------------------------------------------------- Pain Assessment Details Patient Name: Gina [name] Date of Service: 08/04/2015 8:00 AM Medical Record Number: [id number] Patient Account Number: [id number] Date of Birth/Sex: 12/19/1917 (82 y.o. Female) Treating RN: Curtis Sites Primary Care Physician: Einar Crow Other Clinician: Referring Physician: Einar Crow Treating Physician/Extender: Meyer Russel,  Errol Weeks in Treatment: 8 Active Problems Location of Pain Severity and Description of Pain Patient Has Paino No Site Locations Pain Management and Medication Current Pain Management: Notes Topical or injectable lidocaine is offered to patient for acute pain when surgical debridement is performed.  If needed, Patient is instructed to use over the counter pain medication for the following 24-48 hours after debridement. Wound care MDs do not prescribed pain medications. Patient has chronic pain or uncontrolled pain. Patient has been instructed to make an appointment with their Primary Care Physician for pain management. Electronic Signature(s) Signed: 08/04/2015 4:49:27 PM By: Curtis Sites Entered By: Curtis Sites on 08/04/2015 08:14:50 Gina [name] ([id number]) -------------------------------------------------------------------------------- Patient/Caregiver Education Details Patient Name: Gina [name] Date of Service: 08/04/2015 8:00 AM Medical Record Number: [id number] Patient Account Number: [id number] Date of Birth/Gender: [birth year] (82 y.o. Female) Treating RN: Curtis Sites Primary Care Physician: Einar Crow Other Clinician: Referring Physician: Einar Crow Treating Physician/Extender: Rudene Re in Treatment: 8 Education Assessment Education Provided To: Patient and Caregiver Education Topics Provided Nutrition: Handouts: Nutrition Methods: Explain/Verbal Responses: State content correctly Electronic Signature(s) Signed: 08/04/2015 4:49:27 PM By: Curtis Sites Entered By: Curtis Sites on 08/04/2015 08:56:24 Gina [name] ([id number]) -------------------------------------------------------------------------------- Wound Assessment Details Patient Name: Gina [name] Date of Service: 08/04/2015 8:00 AM Medical Record Number: [id number] Patient Account Number: [id number] Date of Birth/Sex: [birth year] (82 y.o. Female) Treating RN: Curtis Sites Primary Care Physician: Einar Crow Other Clinician: Referring Physician: Einar Crow Treating Physician/Extender: Rudene Re in Treatment: 8 Wound Status Wound Number: 6 Primary Diabetic Wound/Ulcer of the Lower Etiology: Extremity Wound  Location: Right Calcaneus Secondary Pressure Ulcer Wounding Event: Pressure Injury Etiology: Date Acquired: 02/02/2015 Wound Open Weeks Of Treatment: 8 Status: Clustered Wound: No Comorbid Cataracts, Chronic sinus History: problems/congestion, Hypertension, Type II Diabetes, Osteoarthritis, Dementia, Seizure Disorder Photos Wound Measurements Length: (cm) 0.5 % Reduction in Width: (cm) 1.1 % Reduction in Depth: (cm) 0.1 Epithelializati Area: (cm) 0.432 Tunneling: Volume: (cm) 0.043 Undermining: Area: 90.4% Volume: 96.8% on: None No No Wound Description Classification: Grade 1 Wound Margin: Thickened Exudate Amount: Large Exudate Type: Serous Exudate Color: amber Foul Odor After Cleansing: No Wound Bed Granulation Amount: Small (1-33%) Exposed Structure Granulation Quality: Red Fascia Exposed: No MARY-ANNE, FENDT. ([id number]) Necrotic Amount: Large (67-100%) Fat Layer Exposed: No Necrotic Quality: Adherent Slough Tendon Exposed: No Muscle Exposed: No Joint Exposed: No Bone Exposed: No Periwound Skin Texture Texture Color No Abnormalities Noted: No No Abnormalities Noted: No Callus: No Atrophie Blanche: No Crepitus: No Cyanosis: No Excoriation: No Ecchymosis: No Fluctuance: No Erythema: No Friable: No Hemosiderin Staining: No Induration: No Mottled: No Localized Edema: No Pallor: No Rash: No Rubor: No Scarring: No Temperature / Pain Moisture Temperature: No Abnormality No Abnormalities Noted: No Dry / Scaly: No Maceration: No Moist: No Wound Preparation Ulcer Cleansing: Rinsed/Irrigated with Saline Topical Anesthetic Applied: Other: lidocaine 4%, Treatment Notes Wound #6 (Right Calcaneus) 1. Cleansed with: Clean wound with Normal Saline 2. Anesthetic Topical Lidocaine 4% cream to wound bed prior to debridement 4. Dressing Applied: Prisma Ag 5. Secondary Dressing Applied Guaze, ABD and kerlix/Conform 7. Secured  with Tape Notes netting; heel offloading boot Electronic Signature(s) Signed: 08/04/2015 4:49:27 PM By: Curtis Sites Entered By: Curtis Sites on 08/04/2015 08:26:33 JUDEE, MOLENAAR ([id number]) LILLIEANA, SAITO ([id number]) -------------------------------------------------------------------------------- Wound Assessment Details Patient Name: Gina [name] Date of Service: 08/04/2015 8:00 AM Medical Record Number: [id number] Patient Account Number: [id number] Date of Birth/Sex: 1917/01/03 (82 y.o. Female) Treating  RN: Curtis Sites Primary Care Physician: Einar Crow Other Clinician: Referring Physician: Einar Crow Treating Physician/Extender: Rudene Re in Treatment: 8 Wound Status Wound Number: 9 Primary Trauma, Other Etiology: Wound Location: Left Elbow Wound Healed - Epithelialized Wounding Event: Trauma Status: Date Acquired: 07/28/2015 Comorbid Cataracts, Chronic sinus Weeks Of Treatment: 0 History: problems/congestion, Hypertension, Clustered Wound: No Type II Diabetes, Osteoarthritis, Dementia, Seizure Disorder Photos Wound Measurements Length: (cm) 0 % Reduction in Width: (cm) 0 % Reduction in Depth: (cm) 0 Epithelializati Area: (cm) 0 Tunneling: Volume: (cm) 0 Undermining: Area: 100% Volume: 100% on: Large (67-100%) No No Wound Description Classification: Partial Thickness Wound Margin: Flat and Intact Exudate Amount: None Present Foul Odor After Cleansing: No Wound Bed Granulation Amount: None Present (0%) Exposed Structure Necrotic Amount: None Present (0%) Fascia Exposed: No Fat Layer Exposed: No Tendon Exposed: No Muscle Exposed: No Joint Exposed: No TAFFANY, HEISER ([id number]) Bone Exposed: No Limited to Skin Breakdown Periwound Skin Texture Texture Color No Abnormalities Noted: No No Abnormalities Noted: No Moisture Temperature / Pain No Abnormalities Noted: No Temperature: No  Abnormality Moist: Yes Tenderness on Palpation: Yes Wound Preparation Ulcer Cleansing: Rinsed/Irrigated with Saline Topical Anesthetic Applied: None Electronic Signature(s) Signed: 08/04/2015 4:49:27 PM By: Curtis Sites Entered By: Curtis Sites on 08/04/2015 08:27:03 Gina [name] ([id number]) -------------------------------------------------------------------------------- Vitals Details Patient Name: Gina [name] Date of Service: 08/04/2015 8:00 AM Medical Record Number: [id number] Patient Account Number: [id number] Date of Birth/Sex: 1917-10-22 (82 y.o. Female) Treating RN: Curtis Sites Primary Care Physician: Einar Crow Other Clinician: Referring Physician: Einar Crow Treating Physician/Extender: Rudene Re in Treatment: 8 Vital Signs Time Taken: 08:14 Temperature (F): 98.2 Height (in): 63 Pulse (bpm): 72 Weight (lbs): 142 Respiratory Rate (breaths/min): 16 Body Mass Index (BMI): 25.2 Blood Pressure (mmHg): 154/66 Reference Range: 80 - 120 mg / dl Electronic Signature(s) Signed: 08/04/2015 4:49:27 PM By: Curtis Sites Entered By: Curtis Sites on 08/04/2015 08:15:55

## 2015-08-12 ENCOUNTER — Encounter: Payer: PPO | Admitting: Surgery

## 2015-08-12 DIAGNOSIS — E11621 Type 2 diabetes mellitus with foot ulcer: Secondary | ICD-10-CM | POA: Diagnosis not present

## 2015-08-12 DIAGNOSIS — L89613 Pressure ulcer of right heel, stage 3: Secondary | ICD-10-CM | POA: Diagnosis not present

## 2015-08-12 DIAGNOSIS — F0390 Unspecified dementia without behavioral disturbance: Secondary | ICD-10-CM | POA: Diagnosis not present

## 2015-08-13 NOTE — Progress Notes (Signed)
DARRYL, BLUMENSTEIN ([id number]) Visit Report for 08/12/2015 Chief Complaint Document Details Patient Name: Gina [name], Gina [name] 08/12/2015 8:00 Date of Service: AM Medical Record [id number] Number: Patient Account Number: [id number] 02-17-1917 (82 y.o. Treating RN: Phillis Haggis Date of Birth/Sex: Female) Other Clinician: Primary Care Physician: Einar Crow Treating Evlyn Kanner Referring Physician: Einar Crow Physician/Extender: Tania Ade in Treatment: 9 Information Obtained from: Patient Chief Complaint Patient is at the clinic for treatment of an open pressure ulcer to the right heel which she's had for about 4 months Electronic Signature(s) Signed: 08/12/2015 8:51:48 AM By: Evlyn Kanner MD, FACS Entered By: Evlyn Kanner on 08/12/2015 08:51:48 Gina [name] ([id number]) -------------------------------------------------------------------------------- HPI Details Patient Name: Gina [name], Gina [name] 08/12/2015 8:00 Date of Service: AM Medical Record [id number] Number: Patient Account Number: [id number] 11/22/1917 (82 y.o. Treating RN: Phillis Haggis Date of Birth/Sex: Female) Other Clinician: Primary Care Physician: Einar Crow Treating Evlyn Kanner Referring Physician: Einar Crow Physician/Extender: Tania Ade in Treatment: 9 History of Present Illness Location: injury to the right heel with drainage Quality: Patient reports No Pain. Severity: Patient states wound are getting worse. Duration: Patient has had the wound for > 4 months prior to seeking treatment at the wound center Timing: Pain in wound is Intermittent (comes and goes Context: The wound appeared gradually over time Modifying Factors: Other treatment(s) tried include:was seen by her PCP and put on Keflex recently but the dose is complete Associated Signs and Symptoms: Patient reports having foul odor. HPI Description: 82 year old patient who is a resident of a nursing home  who comes to see as with a decubitus ulcer of the right heel. He has a past medical history of diabetes mellitus, hypercholesterolemia, hypothyroidism, GERD, hypertension, dementia, chronic kidney disease and age-related osteoporosis. She is status post cholecystectomy, hysterectomy, back fusion, debridement of necrotic ulcer right thigh in February 2017. She has never been a smoker. He has been seen at our wound center in the past and was treated in 2015. She was recently seen by her PCP Dr. Einar Crow, for a heel ulcer and he had recommend Keflex and local dressing. 06/17/2015 -- grew Escherichia coli which was treated by Dr. Leanord Hawking with Keflex 500 mg by mouth 3 times a day for 7 days. Culture results were noted -- wound culture grew Escherichia coli sensitive to Bactrim, ampicillin, ampicillin sulbactam, Kefzol and, cefepime. 06/24/2015 -- x-ray of the right foot was done and this showed no evidence of osteomyelitis. Electronic Signature(s) Signed: 08/12/2015 8:51:53 AM By: Evlyn Kanner MD, FACS Entered By: Evlyn Kanner on 08/12/2015 08:51:53 Gina [name] ([id number]) -------------------------------------------------------------------------------- Physical Exam Details Patient Name: Gina [name], Gina [name] 08/12/2015 8:00 Date of Service: AM Medical Record [id number] Number: Patient Account Number: [id number] December 31, 1917 (82 y.o. Treating RN: Phillis Haggis Date of Birth/Sex: Female) Other Clinician: Primary Care Physician: Einar Crow Treating Evlyn Kanner Referring Physician: Einar Crow Physician/Extender: Tania Ade in Treatment: 9 Constitutional . Pulse regular. Respirations normal and unlabored. Afebrile. . Eyes Nonicteric. Reactive to light. Ears, Nose, Mouth, and Throat Lips, teeth, and gums WNL.Marland Kitchen Moist mucosa without lesions. Neck supple and nontender. No palpable supraclavicular or cervical adenopathy. Normal sized without  goiter. Respiratory WNL. No retractions.. Cardiovascular Pedal Pulses WNL. No clubbing, cyanosis or edema. Lymphatic No adneopathy. No adenopathy. No adenopathy. Musculoskeletal Adexa without tenderness or enlargement.. Digits and nails w/o clubbing, cyanosis, infection, petechiae, ischemia, or inflammatory conditions.. Integumentary (Hair, Skin) No suspicious lesions. No crepitus or fluctuance. No peri-wound warmth or erythema. No masses.Marland Kitchen Psychiatric Judgement and insight Intact.. No evidence of depression,  anxiety, or agitation.. Notes the wound on her right heel had a lot of exudate and dressing material stuck to it and with the curette I was gently able to remove all of it and the wound is completely healed. Electronic Signature(s) Signed: 08/12/2015 8:52:31 AM By: Evlyn Kanner MD, FACS Entered By: Evlyn Kanner on 08/12/2015 08:52:30 Gina [name] ([id number]) -------------------------------------------------------------------------------- Physician Orders Details Patient Name: Gina [name], Gina [name] 08/12/2015 8:00 Date of Service: AM Medical Record [id number] Number: Patient Account Number: [id number] [birth year] (82 y.o. Treating RN: Curtis Sites Date of Birth/Sex: Female) Other Clinician: Primary Care Physician: Einar Crow Treating Evlyn Kanner Referring Physician: Einar Crow Physician/Extender: Tania Ade in Treatment: 9 Verbal / Phone Orders: Yes Clinician: Curtis Sites Read Back and Verified: Yes Diagnosis Coding Discharge From [facility] o Discharge from Wound Care Center - continue protecting right heel with bordered foam for the next 2-3 weeks until new skin becomes stronger and thicker Electronic Signature(s) Signed: 08/12/2015 3:26:16 PM By: Evlyn Kanner MD, FACS Signed: 08/12/2015 3:54:21 PM By: Curtis Sites Entered By: Curtis Sites on 08/12/2015 08:36:24 Gina [name]  ([id number]) -------------------------------------------------------------------------------- Problem List Details Patient Name: Gina [name], Gina [name] 08/12/2015 8:00 Date of Service: AM Medical Record [id number] Number: Patient Account Number: [id number] 02-09-1917 (82 y.o. Treating RN: Phillis Haggis Date of Birth/Sex: Female) Other Clinician: Primary Care Physician: Einar Crow Treating Evlyn Kanner Referring Physician: Einar Crow Physician/Extender: Tania Ade in Treatment: 9 Active Problems ICD-10 Encounter Code Description Active Date Diagnosis E11.621 Type 2 diabetes mellitus with foot ulcer 06/09/2015 Yes L89.613 Pressure ulcer of right heel, stage 3 06/09/2015 Yes F03.90 Unspecified dementia without behavioral disturbance 06/09/2015 Yes S41.112A Laceration without foreign body of left upper arm, initial 07/29/2015 Yes encounter Inactive Problems Resolved Problems Electronic Signature(s) Signed: 08/12/2015 8:51:41 AM By: Evlyn Kanner MD, FACS Entered By: Evlyn Kanner on 08/12/2015 08:51:41 Gina [name] ([id number]) -------------------------------------------------------------------------------- Progress Note Details Patient Name: Gina [name] 08/12/2015 8:00 Date of Service: AM Medical Record [id number] Number: Patient Account Number: [id number] Jun 06, 1917 (82 y.o. Treating RN: Phillis Haggis Date of Birth/Sex: Female) Other Clinician: Primary Care Physician: Einar Crow Treating Evlyn Kanner Referring Physician: Einar Crow Physician/Extender: Tania Ade in Treatment: 9 Subjective Chief Complaint Information obtained from Patient Patient is at the clinic for treatment of an open pressure ulcer to the right heel which she's had for about 4 months History of Present Illness (HPI) The following HPI elements were documented for the patient's wound: Location: injury to the right heel with drainage Quality: Patient reports No  Pain. Severity: Patient states wound are getting worse. Duration: Patient has had the wound for > 4 months prior to seeking treatment at the wound center Timing: Pain in wound is Intermittent (comes and goes Context: The wound appeared gradually over time Modifying Factors: Other treatment(s) tried include:was seen by her PCP and put on Keflex recently but the dose is complete Associated Signs and Symptoms: Patient reports having foul odor. 82 year old patient who is a resident of a nursing home who comes to see as with a decubitus ulcer of the right heel. He has a past medical history of diabetes mellitus, hypercholesterolemia, hypothyroidism, GERD, hypertension, dementia, chronic kidney disease and age-related osteoporosis. She is status post cholecystectomy, hysterectomy, back fusion, debridement of necrotic ulcer right thigh in February 2017. She has never been a smoker. He has been seen at our wound center in the past and was treated in 2015. She was recently seen by her PCP Dr. Einar Crow, for a heel ulcer and he had  recommend Keflex and local dressing. 06/17/2015 -- grew Escherichia coli which was treated by Dr. Leanord Hawkingobson with Keflex 500 mg by mouth 3 times a day for 7 days. Culture results were noted -- wound culture grew Escherichia coli sensitive to Bactrim, ampicillin, ampicillin sulbactam, Kefzol and, cefepime. 06/24/2015 -- x-ray of the right foot was done and this showed no evidence of osteomyelitis. Gina [name], [name]. ([id number]) Objective Constitutional Pulse regular. Respirations normal and unlabored. Afebrile. Vitals Time Taken: 8:14 AM, Height: 63 in, Weight: 142 lbs, BMI: 25.2, Temperature: 98.2 F, Pulse: 73 bpm, Respiratory Rate: 16 breaths/min, Blood Pressure: 129/50 mmHg. Eyes Nonicteric. Reactive to light. Ears, Nose, Mouth, and Throat Lips, teeth, and gums WNL.Marland Kitchen. Moist mucosa without lesions. Neck supple and nontender. No palpable supraclavicular or  cervical adenopathy. Normal sized without goiter. Respiratory WNL. No retractions.. Cardiovascular Pedal Pulses WNL. No clubbing, cyanosis or edema. Lymphatic No adneopathy. No adenopathy. No adenopathy. Musculoskeletal Adexa without tenderness or enlargement.. Digits and nails w/o clubbing, cyanosis, infection, petechiae, ischemia, or inflammatory conditions.Marland Kitchen. Psychiatric Judgement and insight Intact.. No evidence of depression, anxiety, or agitation.. General Notes: the wound on her right heel had a lot of exudate and dressing material stuck to it and with the curette I was gently able to remove all of it and the wound is completely healed. Integumentary (Hair, Skin) No suspicious lesions. No crepitus or fluctuance. No peri-wound warmth or erythema. No masses.. Wound #6 status is Healed - Epithelialized. Original cause of wound was Pressure Injury. The wound is located on the Right Calcaneus. The wound measures 0cm length x 0cm width x 0cm depth; 0cm^2 area and 0cm^3 volume. There is no tunneling or undermining noted. There is a large amount of serous drainage noted. The wound margin is thickened. There is small (1-33%) red granulation within the wound bed. There is a large (67-100%) amount of necrotic tissue within the wound bed including Eschar and Adherent Slough. The periwound skin appearance did not exhibit: Callus, Crepitus, Excoriation, Fluctuance, Friable, Induration, Localized Edema, Rash, Scarring, Dry/Scaly, Maceration, Moist, Atrophie Blanche, Cyanosis, Ecchymosis, Hemosiderin Staining, Mottled, Pallor, Rubor, Erythema. Periwound temperature was noted as Gina [name], [name]. ([id number]) No Abnormality. Assessment Active Problems ICD-10 E11.621 - Type 2 diabetes mellitus with foot ulcer L89.613 - Pressure ulcer of right heel, stage 3 F03.90 - Unspecified dementia without behavioral disturbance S41.112A - Laceration without foreign body of left upper arm, initial  encounter The wound is completely healed and I have asked the daughter to make sure that this is covered with a heel cup and the Sage boot whenever she has pressure on it. Offloading has been discussed in great detail. She is discharge from the wound care services and will be seen back as needed [facility] - continue protecting right heel with bordered foam for the next 2-3 weeks until new skin becomes stronger and thicker The wound is completely healed and I have asked the daughter to make sure that this is covered with a heel cup [facility] she has pressure on it. Offloading has been discussed in great detail. She is discharge from the wound care services and will be seen back as needed. Electronic Signature(s) Signed: 08/12/2015 8:53:53 AM By: Evlyn KannerBritto, Cagney Degrace MD, FACS Entered By: Evlyn KannerBritto, Shaylinn Hladik on 08/12/2015 08:53:53 Gina [name], [name]. ([id number] [name]) Gina [name], [name]. ([id number]) -------------------------------------------------------------------------------- SuperBill Details Patient Name: Gina [name], [name]. Date of Service: 08/12/2015 Medical Record Number: [id number] Patient Account Number: [id number] Date of Birth/Sex:  [birth year] (82 y.o. Female) Treating RN: Ashok Cordia, Debi Primary Care Physician: Einar Crow Other Clinician: Referring Physician: Einar Crow Treating Physician/Extender: Rudene Re in Treatment: 9 Diagnosis Coding ICD-10 Codes Code Description E11.621 Type 2 diabetes mellitus with foot ulcer L89.613 Pressure ulcer of right heel, stage 3 F03.90 Unspecified dementia without behavioral disturbance S41.112A Laceration without foreign body of left upper arm, initial encounter Facility Procedures CPT4 Code: [id number] Description: [id number] - WOUND CARE VISIT-LEV 2 EST PT Modifier: Quantity: 1 Physician Procedures CPT4 Code: [id number] Description: [phone] - WC PHYS LEVEL 2  - EST PT ICD-10 Description Diagnosis E11.621 Type 2 diabetes mellitus with foot ulcer L89.613 Pressure ulcer of right heel, stage 3 F03.90 Unspecified dementia without behavioral disturban Modifier: ce Quantity: 1 Electronic Signature(s) Signed: 08/12/2015 11:50:50 AM By: Curtis Sites Signed: 08/12/2015 3:26:16 PM By: Evlyn Kanner MD, FACS Previous Signature: 08/12/2015 8:54:08 AM Version By: Evlyn Kanner MD, FACS Entered By: Curtis Sites on 08/12/2015 11:50:48

## 2015-08-19 NOTE — Progress Notes (Signed)
Gina [name], Gina [name] ([id number]) Visit Report for 08/12/2015 Arrival Information Details Patient Name: Gina [name], Gina [name]. Date of Service: 08/12/2015 8:00 AM Medical Record Number: [id number] Patient Account Number: [id number] Date of Birth/Sex: [birth year] (82 y.o. Female) Treating RN: Ashok Cordia, Debi Primary Care Physician: Einar Crow Other Clinician: Referring Physician: Einar Crow Treating Physician/Extender: Rudene Re in Treatment: 9 Visit Information History Since Last Visit All ordered tests and consults were completed: No Patient Arrived: Wheel Chair Added or deleted any medications: No Arrival Time: 08:05 Any new allergies or adverse reactions: No Accompanied By: daughter Had a fall or experienced change in No Transfer Assistance: EasyPivot Patient activities of daily living that may affect Lift risk of falls: Patient Identification Verified: Yes Signs or symptoms of abuse/neglect since last No Secondary Verification Yes visito Process Completed: Hospitalized since last visit: No Patient Requires No Pain Present Now: No Transmission-Based Precautions: Patient Has Alerts: Yes Patient Alerts: DM II ABI right noncompressible Electronic Signature(s) Signed: 08/18/2015 4:58:27 PM By: Alejandro Mulling Entered By: Alejandro Mulling on 08/12/2015 08:05:46 Gina [name] ([id number]) -------------------------------------------------------------------------------- Clinic Level of Care Assessment Details Patient Name: Gina [name] Date of Service: 08/12/2015 8:00 AM Medical Record Number: [id number] Patient Account Number: [id number] Date of Birth/Sex: 03-30-1917 (82 y.o. Female) Treating RN: Curtis Sites Primary Care Physician: Einar Crow Other Clinician: Referring Physician: Einar Crow Treating Physician/Extender: Rudene Re in Treatment: 9 Clinic Level of Care Assessment Items TOOL 4 Quantity  Score []  - Use when only an EandM is performed on FOLLOW-UP visit 0 ASSESSMENTS - Nursing Assessment / Reassessment X - Reassessment of Co-morbidities (includes updates in patient status) 1 10 X - Reassessment of Adherence to Treatment Plan 1 5 ASSESSMENTS - Wound and Skin Assessment / Reassessment X - Simple Wound Assessment / Reassessment - one wound 1 5 []  - Complex Wound Assessment / Reassessment - multiple wounds 0 []  - Dermatologic / Skin Assessment (not related to wound area) 0 ASSESSMENTS - Focused Assessment []  - Circumferential Edema Measurements - multi extremities 0 []  - Nutritional Assessment / Counseling / Intervention 0 X - Lower Extremity Assessment (monofilament, tuning fork, pulses) 1 5 []  - Peripheral Arterial Disease Assessment (using hand held doppler) 0 ASSESSMENTS - Ostomy and/or Continence Assessment and Care []  - Incontinence Assessment and Management 0 []  - Ostomy Care Assessment and Management (repouching, etc.) 0 PROCESS - Coordination of Care X - Simple Patient / Family Education for ongoing care 1 15 []  - Complex (extensive) Patient / Family Education for ongoing care 0 []  - Staff obtains Chiropractor, Records, Test Results / Process Orders 0 []  - Staff telephones HHA, Nursing Homes / Clarify orders / etc 0 []  - Routine Transfer to another Facility (non-emergent condition) 0 Gina [name], Gina [name] ([id number]) []  - Routine Hospital Admission (non-emergent condition) 0 []  - New Admissions / Manufacturing engineer / Ordering NPWT, Apligraf, etc. 0 []  - Emergency Hospital Admission (emergent condition) 0 X - Simple Discharge Coordination 1 10 []  - Complex (extensive) Discharge Coordination 0 PROCESS - Special Needs []  - Pediatric / Minor Patient Management 0 []  - Isolation Patient Management 0 []  - Hearing / Language / Visual special needs 0 []  - Assessment of Community assistance (transportation, D/C planning, etc.) 0 []  - Additional assistance / Altered  mentation 0 []  - Support Surface(s) Assessment (bed, cushion, seat, etc.) 0 INTERVENTIONS - Wound Cleansing / Measurement X - Simple Wound Cleansing - one wound 1 5 []  - Complex Wound Cleansing - multiple wounds 0 X - Wound  Imaging (photographs - any number of wounds) 1 5 []  - Wound Tracing (instead of photographs) 0 X - Simple Wound Measurement - one wound 1 5 []  - Complex Wound Measurement - multiple wounds 0 INTERVENTIONS - Wound Dressings []  - Small Wound Dressing one or multiple wounds 0 []  - Medium Wound Dressing one or multiple wounds 0 []  - Large Wound Dressing one or multiple wounds 0 []  - Application of Medications - topical 0 []  - Application of Medications - injection 0 INTERVENTIONS - Miscellaneous []  - External ear exam 0 Gina [name], Gina [name] ([id number]) []  - Specimen Collection (cultures, biopsies, blood, body fluids, etc.) 0 []  - Specimen(s) / Culture(s) sent or taken to Lab for analysis 0 []  - Patient Transfer (multiple staff / Michiel Sites Lift / Similar devices) 0 []  - Simple Staple / Suture removal (25 or less) 0 []  - Complex Staple / Suture removal (26 or more) 0 []  - Hypo / Hyperglycemic Management (close monitor of Blood Glucose) 0 []  - Ankle / Brachial Index (ABI) - do not check if billed separately 0 X - Vital Signs 1 5 Has the patient been seen at the hospital within the last three years: Yes Total Score: 70 Level Of Care: New/Established - Level 2 Electronic Signature(s) Signed: 08/12/2015 3:54:21 PM By: Curtis Sites Entered By: Curtis Sites on 08/12/2015 11:50:38 Gina [name] ([id number]) -------------------------------------------------------------------------------- Encounter Discharge Information Details Patient Name: Gina [name] Date of Service: 08/12/2015 8:00 AM Medical Record Number: [id number] Patient Account Number: [id number] Date of Birth/Sex: [birth year] (82 y.o. Female) Treating RN: Ashok Cordia, Debi Primary Care Physician:  Einar Crow Other Clinician: Referring Physician: Einar Crow Treating Physician/Extender: Rudene Re in Treatment: 9 Encounter Discharge Information Items Discharge Pain Level: 0 Discharge Condition: Stable Ambulatory Status: Wheelchair Discharge Destination: Nursing Home Transportation: Other Accompanied By: daughter Schedule Follow-up Appointment: Yes Medication Reconciliation completed and provided to Patient/Care Yes Anastazja Isaac: Provided on Clinical Summary of Care: 08/12/2015 Form Type Recipient Paper Patient LW Electronic Signature(s) Signed: 08/12/2015 8:52:14 AM By: Gwenlyn Perking Entered By: Gwenlyn Perking on 08/12/2015 08:52:13 Gina [name] ([id number]) -------------------------------------------------------------------------------- Lower Extremity Assessment Details Patient Name: Gina [name] Date of Service: 08/12/2015 8:00 AM Medical Record Number: [id number] Patient Account Number: [id number] Date of Birth/Sex: Jul 12, 1917 (82 y.o. Female) Treating RN: Ashok Cordia, Debi Primary Care Physician: Einar Crow Other Clinician: Referring Physician: Einar Crow Treating Physician/Extender: Rudene Re in Treatment: 9 Vascular Assessment Pulses: Posterior Tibial Dorsalis Pedis Palpable: [Right:Yes] Extremity colors, hair growth, and conditions: Extremity Color: [Right:Normal] Temperature of Extremity: [Right:Warm] Capillary Refill: [Right:< 3 seconds] Electronic Signature(s) Signed: 08/18/2015 4:58:27 PM By: Alejandro Mulling Entered By: Alejandro Mulling on 08/12/2015 08:06:27 Gina [name] ([id number]) -------------------------------------------------------------------------------- Multi Wound Chart Details Patient Name: Gina [name] Date of Service: 08/12/2015 8:00 AM Medical Record Number: [id number] Patient Account Number: [id number] Date of Birth/Sex: Sep 19, 1917 (82 y.o. 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] [N/A:N/A] Secondary Etiology: [6:Pressure Ulcer] [N/A:N/A] Comorbid History: [6:Cataracts, Chronic sinus problems/congestion, Hypertension, Type II Diabetes, Osteoarthritis, Dementia, Seizure Disorder] [N/A:N/A] Date Acquired: [6:02/02/2015] [N/A:N/A] Weeks of Treatment: [6:9] [N/A:N/A] Wound Status: [6:Open] [N/A:N/A] Measurements L x W x D 0.1x0.1x0.1 [  N/A:N/A] (cm) Area (cm) : [6:0.008] [N/A:N/A] Volume (cm) : [6:0.001] [N/A:N/A] % Reduction in Area: [6:99.80%] [N/A:N/A] % Reduction in Volume: 99.90% [N/A:N/A] Classification: [6:Grade 1] [N/A:N/A] Exudate Amount: [6:Large] [N/A:N/A] Exudate Type: [6:Serous] [N/A:N/A] Exudate Color: [6:amber] [N/A:N/A] Wound Margin: [6:Thickened] [N/A:N/A] Granulation Amount: [6:Small (1-33%)] [N/A:N/A] Granulation Quality: [6:Red] [N/A:N/A] Necrotic Amount: [6:Large (67-100%)] [N/A:N/A] Necrotic Tissue: [6:Eschar, Adherent Slough] [N/A:N/A] Exposed Structures: Fascia: No N/A N/A Fat: No Tendon: No Muscle: No Joint: No Bone: No Epithelialization: None N/A N/A Periwound Skin Texture: Edema: No N/A N/A Excoriation: No Induration: No Callus: No Crepitus: No Fluctuance: No Friable: No Rash: No Scarring: No Periwound Skin Maceration: No N/A N/A Moisture: Moist: No Dry/Scaly: No Periwound Skin Color: Atrophie Blanche: No N/A N/A Cyanosis: No Ecchymosis: No Erythema: No Hemosiderin  Staining: No Mottled: No Pallor: No Rubor: No Temperature: No Abnormality N/A N/A Tenderness on No N/A N/A Palpation: Wound Preparation: Ulcer Cleansing: N/A N/A Rinsed/Irrigated with Saline Topical Anesthetic Applied: Other: lidocaine 4% Treatment Notes Electronic Signature(s) Signed: 08/18/2015 4:58:27 PM By: Alejandro MullingPinkerton, Debra Entered By: Alejandro MullingPinkerton, Debra on 08/12/2015 08:17:45 Gina BossWILLIAMS, Gina [name]. ([id number]) -------------------------------------------------------------------------------- Multi-Disciplinary Care Plan Details Patient Name: Gina BossWILLIAMS, Gina [name]. Date of Service: 08/12/2015 8:00 AM Medical Record Number: [id number] Patient Account Number: [id number] Date of Birth/Sex: 06-May-1917 (82 y.o. Female) Treating RN: Ashok CordiaPinkerton, Debi Primary Care Physician: Einar CrowAnderson, Marshall Other Clinician: Referring Physician: Einar CrowAnderson, Marshall Treating Physician/Extender: Rudene ReBritto, Errol Weeks in Treatment: 9 Active Inactive Electronic Signature(s) Signed: 08/12/2015 3:54:21 PM By: Curtis Sitesorthy, Joanna Signed: 08/18/2015 4:58:27 PM By: Alejandro MullingPinkerton, Debra Entered By: Curtis Sitesorthy, Joanna on 08/12/2015 08:35:27 Gina BossWILLIAMS, Gina [name]. ([id number]) -------------------------------------------------------------------------------- Pain Assessment Details Patient Name: Gina BossWILLIAMS, Gina [name]. Date of Service: 08/12/2015 8:00 AM Medical Record Number: [id number] Patient Account Number: [id number] Date of Birth/Sex: 06-May-1917 (82 y.o. Female) Treating RN: Ashok CordiaPinkerton, Debi Primary Care Physician: Einar CrowAnderson, Marshall Other Clinician: Referring Physician: Einar CrowAnderson, Marshall Treating Physician/Extender: Rudene ReBritto, Errol Weeks in Treatment: 9 Active Problems Location of Pain Severity and Description of Pain Patient Has Paino No Site Locations With Dressing Change: No Pain Management and Medication Current Pain Management: Electronic Signature(s) Signed: 08/18/2015 4:58:27 PM By: Alejandro MullingPinkerton, Debra Entered By:  Alejandro MullingPinkerton, Debra on 08/12/2015 08:05:56 Gina BossWILLIAMS, Gina [name]. ([id number]) -------------------------------------------------------------------------------- Patient/Caregiver Education Details Patient Name: Gina BossWILLIAMS, Gina [name]. Date of Service: 08/12/2015 8:00 AM Medical Record Number: [id number] Patient Account Number: [id number] Date of Birth/Gender: 06-May-1917 (82 y.o. Female) Treating RN: Ashok CordiaPinkerton, Debi Primary Care Physician: Einar CrowAnderson, Marshall Other Clinician: Referring Physician: Einar CrowAnderson, Marshall Treating Physician/Extender: Rudene ReBritto, Errol Weeks in Treatment: 9 Education Assessment Education Provided To: Patient Education Topics Provided Wound/Skin Impairment: Other: continue protecting right heel with bordered foam for the next 2-3 weeks until skin Handouts: becomes strong. Methods: Demonstration, Explain/Verbal Responses: State content correctly Electronic Signature(s) Signed: 08/18/2015 4:58:27 PM By: Alejandro MullingPinkerton, Debra Entered By: Alejandro MullingPinkerton, Debra on 08/12/2015 08:38:37 Gina BossWILLIAMS, Gina [name]. ([id number]) -------------------------------------------------------------------------------- Wound Assessment Details Patient Name: Gina BossWILLIAMS, Gina [name]. Date of Service: 08/12/2015 8:00 AM Medical Record Number: [id number] Patient Account Number: [id number] Date of Birth/Sex: 06-May-1917 (82 y.o. Female) Treating RN: Curtis Sitesorthy, Joanna Primary Care Physician: Einar CrowAnderson, Marshall Other Clinician: Referring Physician: Einar CrowAnderson, Marshall Treating Physician/Extender: Rudene ReBritto, Errol Weeks in Treatment: 9 Wound Status Wound Number: 6 Primary Diabetic Wound/Ulcer of the Lower Etiology: Extremity Wound Location: Right Calcaneus Secondary Pressure Ulcer Wounding Event: Pressure Injury Etiology: Date Acquired: 02/02/2015 Wound Healed - Epithelialized Weeks Of Treatment: 9 Status: Clustered Wound: No Comorbid Cataracts, Chronic sinus History: problems/congestion, Hypertension, Type II  Diabetes, Osteoarthritis, Dementia, Seizure Disorder Photos Photo Uploaded By: Alejandro MullingPinkerton, Debra on 08/12/2015 09:16:35  Wound Measurements Length: (cm) 0 % Reduction in Width: (cm) 0 % Reduction in Depth: (cm) 0 Epithelializati Area: (cm) 0 Tunneling: Volume: (cm) 0 Undermining: Area: 100% Volume: 100% on: None No No Wound Description Classification: Grade 1 Wound Margin: Thickened Exudate Amount: Large Exudate Type: Serous Exudate Color: amber Foul Odor After Cleansing: No Wound Bed Granulation Amount: Small (1-33%) Exposed Structure Gina BossWILLIAMS, Clark [name]. ([id number]) Granulation Quality: Red Fascia Exposed: No Necrotic Amount: Large (67-100%) Fat Layer Exposed: No Necrotic Quality: Eschar, Adherent Slough Tendon Exposed: No Muscle Exposed: No Joint Exposed: No Bone Exposed: No Periwound Skin Texture Texture Color No Abnormalities Noted: No No Abnormalities Noted: No Callus: No Atrophie Blanche: No Crepitus: No Cyanosis: No Excoriation: No Ecchymosis: No Fluctuance: No Erythema: No Friable: No Hemosiderin Staining: No Induration: No Mottled: No Localized Edema: No Pallor: No Rash: No Rubor: No Scarring: No Temperature / Pain Moisture Temperature: No Abnormality No Abnormalities Noted: No Dry / Scaly: No Maceration: No Moist: No Wound Preparation Ulcer Cleansing: Rinsed/Irrigated with Saline Topical Anesthetic Applied: Other: lidocaine 4%, Electronic Signature(s) Signed: 08/12/2015 3:54:21 PM By: Curtis Sitesorthy, Joanna Entered By: Curtis Sitesorthy, Joanna on 08/12/2015 08:34:55 Gina BossWILLIAMS, Mckenna [name]. ([id number]) -------------------------------------------------------------------------------- Vitals Details Patient Name: Gina BossWILLIAMS, Nahjae [name]. Date of Service: 08/12/2015 8:00 AM Medical Record Number: [id number] Patient Account Number: [id number] Date of Birth/Sex: Dec 04, 1917 (82 y.o. Female) Treating RN: Ashok CordiaPinkerton, Debi Primary Care Physician: Einar CrowAnderson,  Marshall Other Clinician: Referring Physician: Einar CrowAnderson, Marshall Treating Physician/Extender: Rudene ReBritto, Errol Weeks in Treatment: 9 Vital Signs Time Taken: 08:14 Temperature (F): 98.2 Height (in): 63 Pulse (bpm): 73 Weight (lbs): 142 Respiratory Rate (breaths/min): 16 Body Mass Index (BMI): 25.2 Blood Pressure (mmHg): 129/50 Reference Range: 80 - 120 mg / dl Electronic Signature(s) Signed: 08/18/2015 4:58:27 PM By: Alejandro MullingPinkerton, Debra Entered By: Alejandro MullingPinkerton, Debra on 08/12/2015 08:16:26

## 2015-09-06 DIAGNOSIS — N390 Urinary tract infection, site not specified: Secondary | ICD-10-CM | POA: Diagnosis not present

## 2015-10-07 DIAGNOSIS — I10 Essential (primary) hypertension: Secondary | ICD-10-CM | POA: Diagnosis not present

## 2015-10-07 DIAGNOSIS — E119 Type 2 diabetes mellitus without complications: Secondary | ICD-10-CM | POA: Diagnosis not present

## 2015-10-07 DIAGNOSIS — F0390 Unspecified dementia without behavioral disturbance: Secondary | ICD-10-CM | POA: Diagnosis not present

## 2016-01-06 DIAGNOSIS — M79675 Pain in left toe(s): Secondary | ICD-10-CM | POA: Diagnosis not present

## 2016-01-06 DIAGNOSIS — M79674 Pain in right toe(s): Secondary | ICD-10-CM | POA: Diagnosis not present

## 2016-01-06 DIAGNOSIS — B351 Tinea unguium: Secondary | ICD-10-CM | POA: Diagnosis not present

## 2016-01-06 DIAGNOSIS — E119 Type 2 diabetes mellitus without complications: Secondary | ICD-10-CM | POA: Diagnosis not present

## 2016-01-18 DIAGNOSIS — E119 Type 2 diabetes mellitus without complications: Secondary | ICD-10-CM | POA: Diagnosis not present

## 2016-01-18 DIAGNOSIS — I10 Essential (primary) hypertension: Secondary | ICD-10-CM | POA: Diagnosis not present

## 2016-01-18 DIAGNOSIS — F0390 Unspecified dementia without behavioral disturbance: Secondary | ICD-10-CM | POA: Diagnosis not present

## 2016-05-19 IMAGING — CR DG LUMBAR SPINE 2-3V
2 series · 2 of 2 positions shown · non-contrast
Comparison: 10/20/2012

CLINICAL DATA: Status post fall

EXAM:
LUMBAR SPINE - 2-3 VIEW

[l-spine ap]
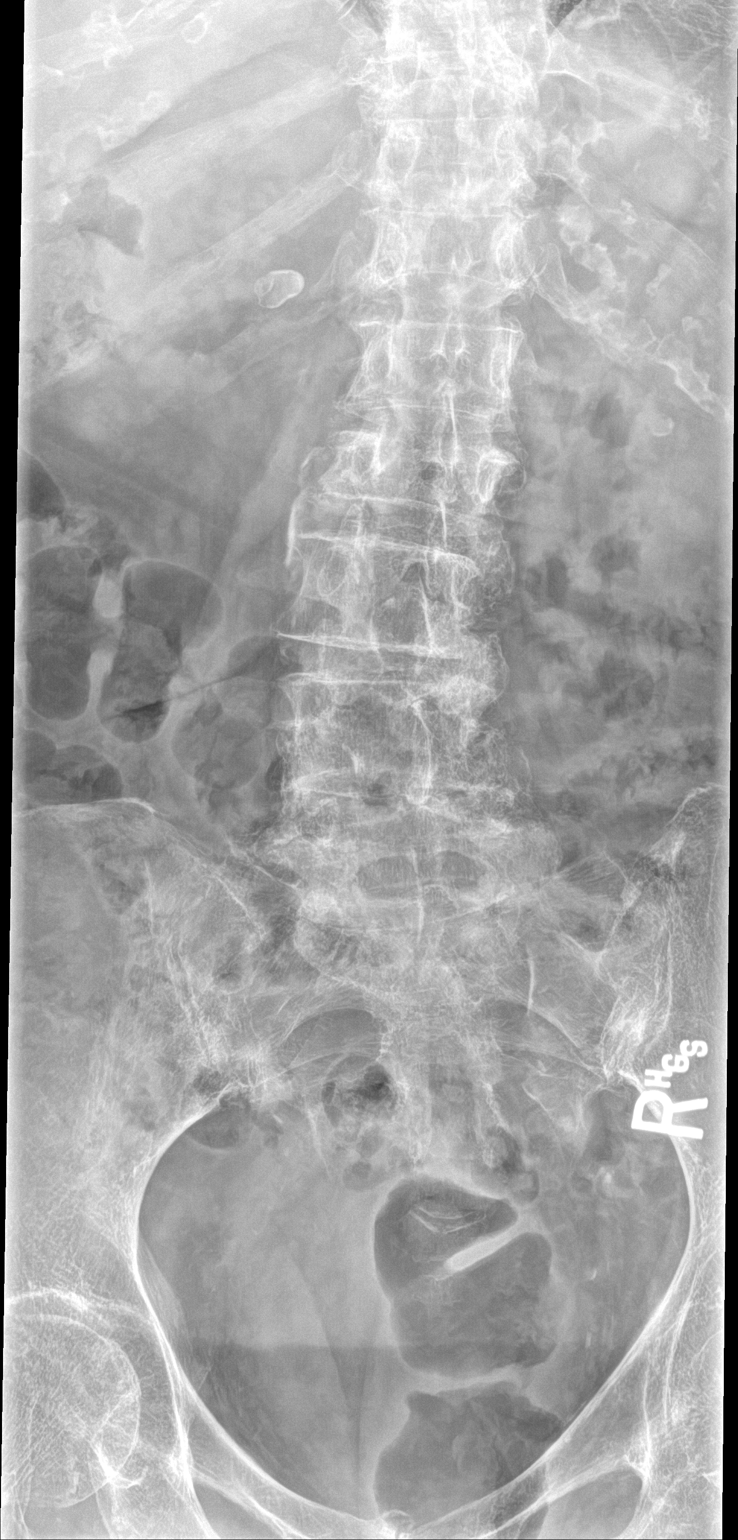

[l-spine lat]
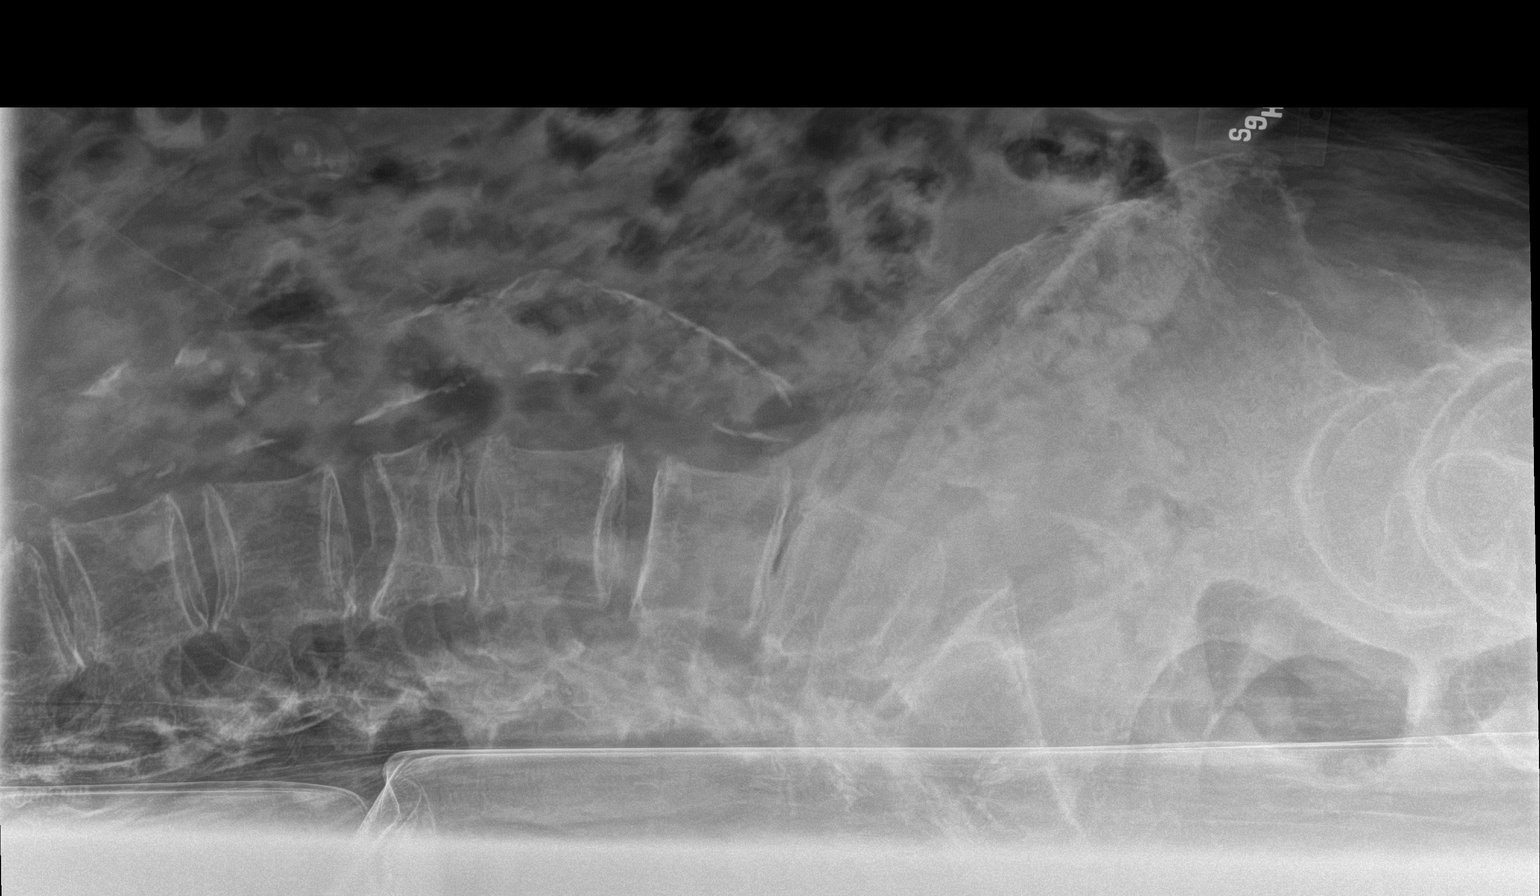

[2 of 2 positions shown; findings below may reference images not displayed]

FINDINGS: There is a chronic L2 vertebral body compression fracture unchanged
compared with 10/20/2012.

The remainder the vertebral body heights are maintained. Alignment
is normal. There is degenerative disc disease with disc height loss
at L4-5. There is diffuse bilateral facet arthropathy throughout the
lumbar spine.

There is thoracic aortic atherosclerosis.
IMPRESSION: 1. No acute osseous injury of the lumbar spine.
2. Chronic L2 vertebral body compression fracture.

## 2016-05-20 IMAGING — CR DG HIP (WITH PELVIS) OPERATIVE*R*
2 series · 2 of 2 positions shown · non-contrast
Comparison: 10/20/2014

CLINICAL DATA: Right hip fracture

EXAM:
OPERATIVE RIGHT HIP (WITH PELVIS IF PERFORMED) 2 VIEWS
TECHNIQUE: Fluoroscopic spot image(s) were submitted for interpretation
post-operatively.

[m1]
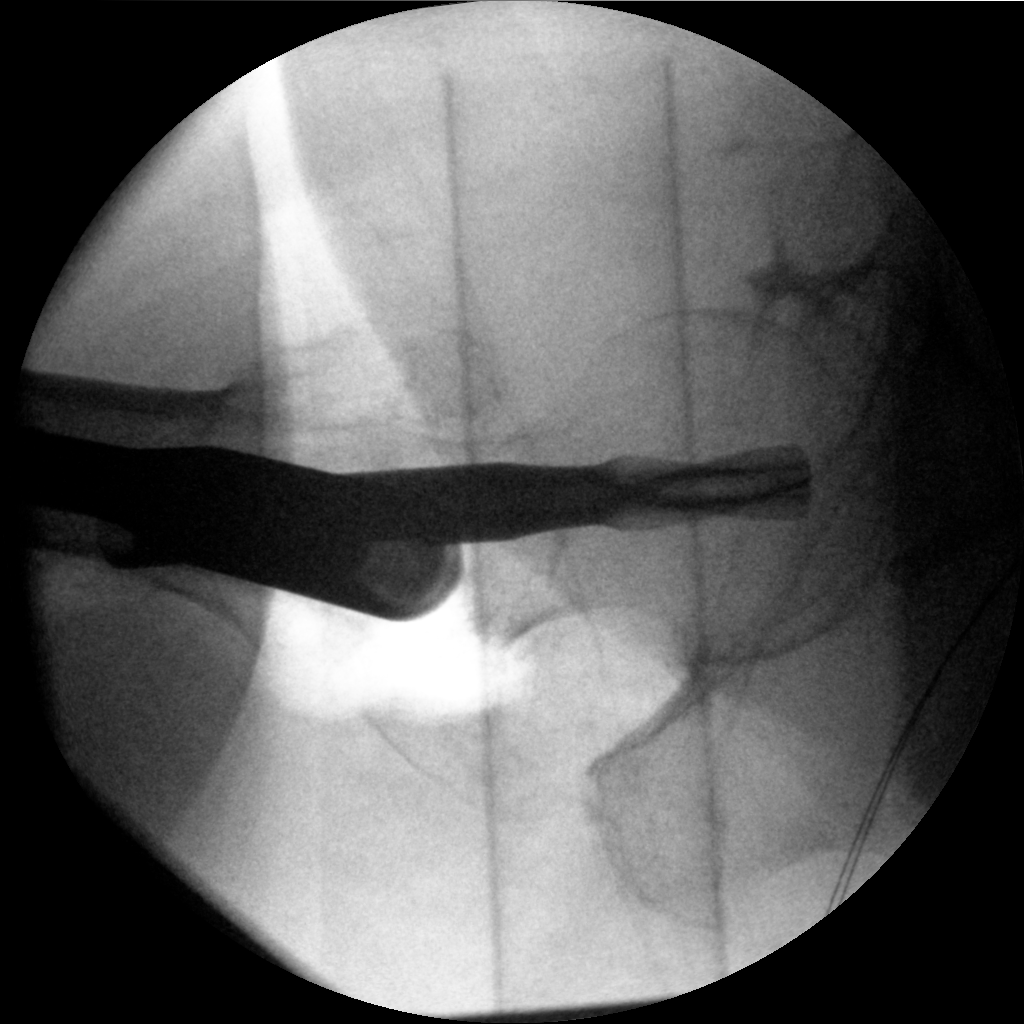

[m2]
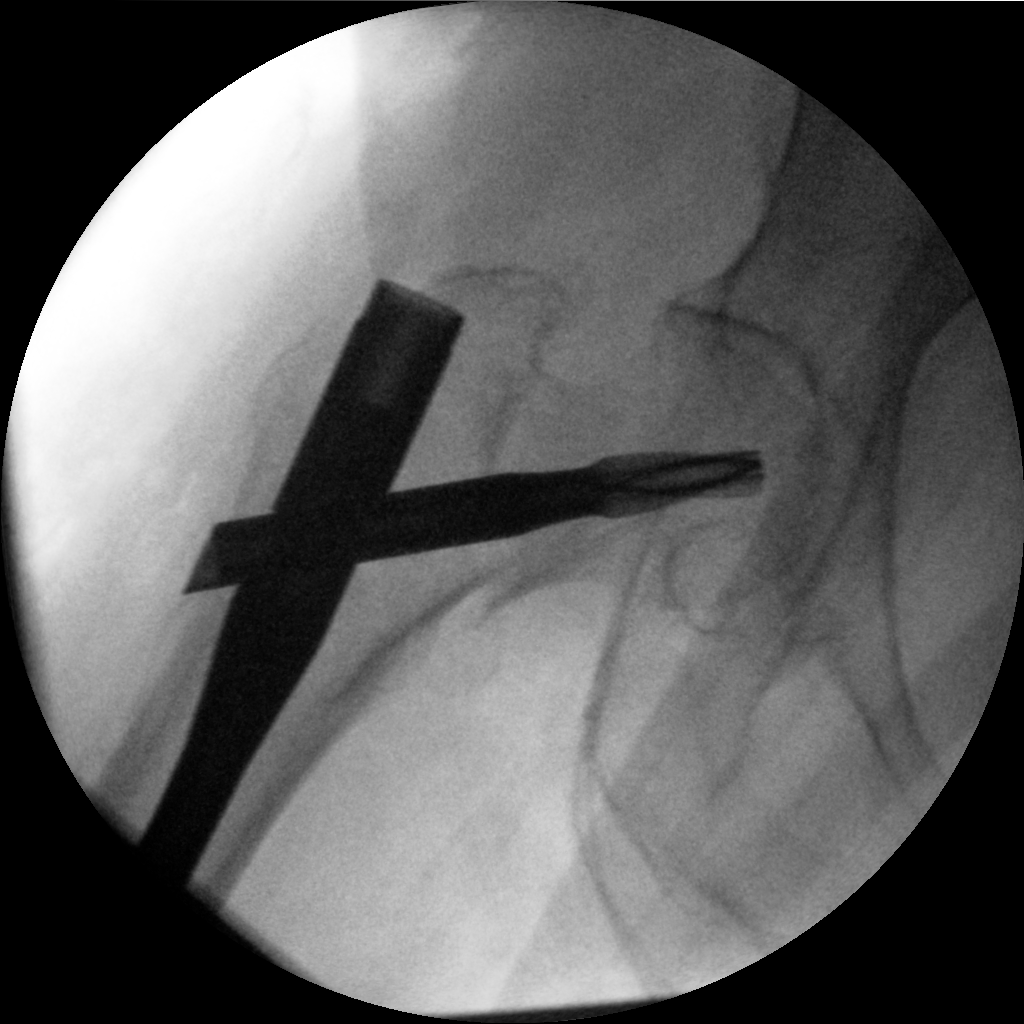

[2 of 2 positions shown; findings below may reference images not displayed]

FINDINGS: There is a right femoral neck fracture. An intra medullary nail is
partially visualized within the proximal right femur.

Fluoroscopy time 0 minutes 50 seconds
IMPRESSION: Intra medullary nail placement right femur

## 2017-05-02 ENCOUNTER — Other Ambulatory Visit: Payer: Self-pay

## 2017-05-02 ENCOUNTER — Emergency Department: Payer: Medicare (Managed Care)

## 2017-05-02 ENCOUNTER — Encounter: Payer: Self-pay | Admitting: Internal Medicine

## 2017-05-02 ENCOUNTER — Observation Stay
Admission: EM | Admit: 2017-05-02 | Discharge: 2017-05-03 | Disposition: A | Discharge status: Hospice | Payer: Medicare (Managed Care) | Attending: Internal Medicine | Admitting: Internal Medicine

## 2017-05-02 DIAGNOSIS — Z7401 Bed confinement status: Secondary | ICD-10-CM | POA: Diagnosis not present

## 2017-05-02 DIAGNOSIS — K3184 Gastroparesis: Secondary | ICD-10-CM | POA: Insufficient documentation

## 2017-05-02 DIAGNOSIS — R001 Bradycardia, unspecified: Principal | ICD-10-CM | POA: Diagnosis present

## 2017-05-02 DIAGNOSIS — Z8674 Personal history of sudden cardiac arrest: Secondary | ICD-10-CM | POA: Insufficient documentation

## 2017-05-02 DIAGNOSIS — Z515 Encounter for palliative care: Secondary | ICD-10-CM | POA: Diagnosis not present

## 2017-05-02 DIAGNOSIS — R0681 Apnea, not elsewhere classified: Secondary | ICD-10-CM | POA: Insufficient documentation

## 2017-05-02 DIAGNOSIS — I10 Essential (primary) hypertension: Secondary | ICD-10-CM | POA: Diagnosis not present

## 2017-05-02 DIAGNOSIS — K219 Gastro-esophageal reflux disease without esophagitis: Secondary | ICD-10-CM | POA: Diagnosis not present

## 2017-05-02 DIAGNOSIS — F0390 Unspecified dementia without behavioral disturbance: Secondary | ICD-10-CM | POA: Insufficient documentation

## 2017-05-02 DIAGNOSIS — Z66 Do not resuscitate: Secondary | ICD-10-CM | POA: Insufficient documentation

## 2017-05-02 DIAGNOSIS — F419 Anxiety disorder, unspecified: Secondary | ICD-10-CM | POA: Diagnosis not present

## 2017-05-02 DIAGNOSIS — I700 Atherosclerosis of aorta: Secondary | ICD-10-CM | POA: Insufficient documentation

## 2017-05-02 DIAGNOSIS — M810 Age-related osteoporosis without current pathological fracture: Secondary | ICD-10-CM | POA: Diagnosis not present

## 2017-05-02 DIAGNOSIS — Z7984 Long term (current) use of oral hypoglycemic drugs: Secondary | ICD-10-CM | POA: Insufficient documentation

## 2017-05-02 DIAGNOSIS — R55 Syncope and collapse: Secondary | ICD-10-CM

## 2017-05-02 DIAGNOSIS — E1143 Type 2 diabetes mellitus with diabetic autonomic (poly)neuropathy: Secondary | ICD-10-CM | POA: Insufficient documentation

## 2017-05-02 DIAGNOSIS — Z993 Dependence on wheelchair: Secondary | ICD-10-CM | POA: Diagnosis not present

## 2017-05-02 DIAGNOSIS — Z79899 Other long term (current) drug therapy: Secondary | ICD-10-CM | POA: Diagnosis not present

## 2017-05-02 DIAGNOSIS — Z888 Allergy status to other drugs, medicaments and biological substances status: Secondary | ICD-10-CM | POA: Insufficient documentation

## 2017-05-02 DIAGNOSIS — L8990 Pressure ulcer of unspecified site, unspecified stage: Secondary | ICD-10-CM

## 2017-05-02 DIAGNOSIS — E039 Hypothyroidism, unspecified: Secondary | ICD-10-CM | POA: Diagnosis not present

## 2017-05-02 DIAGNOSIS — L89159 Pressure ulcer of sacral region, unspecified stage: Secondary | ICD-10-CM | POA: Diagnosis not present

## 2017-05-02 DIAGNOSIS — I2510 Atherosclerotic heart disease of native coronary artery without angina pectoris: Secondary | ICD-10-CM | POA: Diagnosis not present

## 2017-05-02 LAB — GLUCOSE, CAPILLARY: GLUCOSE-CAPILLARY: 186 mg/dL — AB (ref 65–99)

## 2017-05-02 LAB — MRSA PCR SCREENING: MRSA by PCR: NEGATIVE

## 2017-05-02 LAB — BASIC METABOLIC PANEL
ANION GAP: 4 — AB (ref 5–15)
BUN: 21 mg/dL — ABNORMAL HIGH (ref 6–20)
CALCIUM: 8.4 mg/dL — AB (ref 8.9–10.3)
CO2: 22 mmol/L (ref 22–32)
Chloride: 97 mmol/L — ABNORMAL LOW (ref 101–111)
Creatinine, Ser: 1.04 mg/dL — ABNORMAL HIGH (ref 0.44–1.00)
GFR, EST AFRICAN AMERICAN: 50 mL/min — AB (ref >60)
GFR, EST NON AFRICAN AMERICAN: 44 mL/min — AB (ref >60)
Glucose, Bld: 182 mg/dL — ABNORMAL HIGH (ref 65–99)
Potassium: 4.7 mmol/L (ref 3.5–5.1)
SODIUM: 123 mmol/L — AB (ref 135–145)

## 2017-05-02 LAB — CBC
HCT: 30 % — ABNORMAL LOW (ref 35.0–47.0)
HEMOGLOBIN: 10.2 g/dL — AB (ref 12.0–16.0)
MCH: 32 pg (ref 26.0–34.0)
MCHC: 34 g/dL (ref 32.0–36.0)
MCV: 94 fL (ref 80.0–100.0)
Platelets: 202 10*3/uL (ref 150–440)
RBC: 3.19 MIL/uL — ABNORMAL LOW (ref 3.80–5.20)
RDW: 13.3 % (ref 11.5–14.5)
WBC: 12.7 10*3/uL — AB (ref 3.6–11.0)

## 2017-05-02 LAB — TROPONIN I

## 2017-05-02 LAB — ALBUMIN: Albumin: 2.1 g/dL — ABNORMAL LOW (ref 3.5–5.0)

## 2017-05-02 LAB — TSH: TSH: 7.33 u[IU]/mL — AB (ref 0.350–4.500)

## 2017-05-02 MED ORDER — ACETAMINOPHEN 325 MG PO TABS: 650.0000 mg | ORAL_TABLET | Freq: Four times a day (QID) | ORAL | Status: DC | PRN

## 2017-05-02 MED ORDER — MORPHINE SULFATE (CONCENTRATE) 10 MG/0.5ML PO SOLN: 5.0000 mg | ORAL | Status: DC | PRN

## 2017-05-02 MED ORDER — SODIUM CHLORIDE 0.9 % IV BOLUS
250.0000 mL | Freq: Once | INTRAVENOUS | Status: AC
  Administered 2017-05-02: 250 mL via INTRAVENOUS

## 2017-05-02 MED ORDER — GLYCOPYRROLATE 1 MG PO TABS
1.0000 mg | ORAL_TABLET | ORAL | Status: DC | PRN
Start: 2017-05-02 — End: 2017-05-03
  Filled 2017-05-02: qty 1

## 2017-05-02 MED ORDER — ATROPINE SULFATE 1 MG/10ML IJ SOSY
0.5000 mg | PREFILLED_SYRINGE | Freq: Once | INTRAMUSCULAR | Status: AC
  Administered 2017-05-02: 0.5 mg via INTRAVENOUS

## 2017-05-02 MED ORDER — GLYCOPYRROLATE 0.2 MG/ML IJ SOLN
0.2000 mg | INTRAMUSCULAR | Status: DC | PRN
  Filled 2017-05-02: qty 1

## 2017-05-02 MED ORDER — ALBUTEROL SULFATE (2.5 MG/3ML) 0.083% IN NEBU: 2.5000 mg | INHALATION_SOLUTION | RESPIRATORY_TRACT | Status: DC | PRN

## 2017-05-02 MED ORDER — LORAZEPAM 2 MG/ML IJ SOLN
0.5000 mg | INTRAMUSCULAR | Status: DC | PRN
  Administered 2017-05-02: 1 mg via INTRAVENOUS
  Filled 2017-05-02: qty 1

## 2017-05-02 MED ORDER — SODIUM CHLORIDE 0.9% FLUSH
3.0000 mL | Freq: Two times a day (BID) | INTRAVENOUS | Status: DC
  Administered 2017-05-02: 3 mL via INTRAVENOUS

## 2017-05-02 MED ORDER — ATROPINE SULFATE 1 MG/10ML IJ SOSY
PREFILLED_SYRINGE | INTRAMUSCULAR | Status: AC
  Administered 2017-05-02: 0.5 mg via INTRAVENOUS
  Filled 2017-05-02: qty 10

## 2017-05-02 MED ORDER — ONDANSETRON HCL 4 MG PO TABS: 4.0000 mg | ORAL_TABLET | Freq: Four times a day (QID) | ORAL | Status: DC | PRN

## 2017-05-02 MED ORDER — ONDANSETRON HCL 4 MG/2ML IJ SOLN
4.0000 mg | Freq: Four times a day (QID) | INTRAMUSCULAR | Status: DC | PRN
Start: 2017-05-02 — End: 2017-05-03
  Filled 2017-05-02: qty 2

## 2017-05-02 MED ORDER — POLYVINYL ALCOHOL 1.4 % OP SOLN
1.0000 [drp] | Freq: Four times a day (QID) | OPHTHALMIC | Status: DC | PRN
  Filled 2017-05-02: qty 15

## 2017-05-02 MED ORDER — ACETAMINOPHEN 650 MG RE SUPP: 650.0000 mg | Freq: Four times a day (QID) | RECTAL | Status: DC | PRN

## 2017-05-02 MED ORDER — POLYETHYLENE GLYCOL 3350 17 G PO PACK: 17.0000 g | PACK | Freq: Every day | ORAL | Status: DC | PRN

## 2017-05-02 MED ORDER — LORAZEPAM 0.5 MG PO TABS: 0.5000 mg | ORAL_TABLET | ORAL | Status: DC | PRN

## 2017-05-02 MED ORDER — MORPHINE SULFATE (PF) 2 MG/ML IV SOLN: 0.5000 mg | INTRAVENOUS | Status: DC | PRN

## 2017-05-02 MED ORDER — LORAZEPAM 2 MG/ML PO CONC: 0.5000 mg | ORAL | Status: DC | PRN

## 2017-05-02 NOTE — ED Notes (Signed)
Chaplain paged, awaiting response.

## 2017-05-02 NOTE — Progress Notes (Signed)
Advance care planning  Purpose of Encounter Sinus bradycardia, medically ill  Parties in Attendance 2 daughters who are her healthcare decision makers  Patients Decisional capacity Patient has dementia.  Pleasantly confused.  Unable to make own decisions.  Patient is a resident of assisted living facility.  Wheelchair and bedbound.  At baseline patient has advanced dementia.  Unable to make own decisions.  She has had good appetite according to the daughters.  Has not lost any weight. Today she presents with syncope, severe bradycardia.  Had chest compressions done.  We discussed regarding patient's CODE STATUS and daughters mentioned that she is DO NOT RESUSCITATE and DO NOT INTUBATE.  At this time patient does wake up on calling her name and smiles. Discussed regarding need for pacemaker if patient has bradycardia.  Daughters do not want any aggressive treatment or surgeries.  Do not want a pacemaker.  Patient continues to be weak and has bradycardia and requesting patient be monitored overnight. Orders entered for DNR.  Discontinued telemetry.  Goals of care determination Poor prognosis with ongoing bradycardia.  We consulted palliative care also.  DNR.  Orders entered  Time spent - 20 minutes

## 2017-05-02 NOTE — H&P (Signed)
[facility] [place] [facility]   PATIENT NAME: Gina [name]    MR#:  [id number]  DATE OF BIRTH:  1917-01-07  DATE OF ADMISSION:  05/02/2017  PRIMARY CARE PHYSICIAN: Lauro Regulus, MD   REQUESTING/REFERRING PHYSICIAN: Dr. Darnelle Catalan  CHIEF COMPLAINT:   Chief Complaint  Patient presents with  . Loss of Consciousness    HISTORY OF PRESENT ILLNESS:  Gina [name]  is a 84 y.o. female with a known history of hypertension, dementia who is a resident of assisted living facility presented to the emergency room after having an episode of syncope.  Here patient had a prolonged pause.  Had 3 chest compressions and woke up.  Was also apneic.  Patient is DNR.  Daughter is at bedside have decided not to have any pacemaker placed. Patient at baseline is bed and wheelchair bound.  Has dementia.  Recognizes her daughters who visit her often but thinks they are her friends.  PAST MEDICAL HISTORY:   Past Medical History:  Diagnosis Date  . Anxiety   . Dementia   . Diabetes mellitus without complication (HCC)   . GERD (gastroesophageal reflux disease)   . Hypertension   . Hypothyroidism   . Osteoporosis   . Sacral decubitus ulcer     PAST SURGICAL HISTORY:   Past Surgical History:  Procedure Laterality Date  . DEBRIDEMENT AND CLOSURE WOUND Right 02/12/2015   Procedure: DEBRIDEMENT AND CLOSURE WOUND;  Surgeon: Christena Flake, MD;  Location: [facility] ORS;  Service: Orthopedics;  Laterality: Right;  . INTRAMEDULLARY (IM) NAIL INTERTROCHANTERIC Right 10/21/2014   Procedure: INTRAMEDULLARY (IM) NAIL INTERTROCHANTRIC;  Surgeon: Deeann Saint, MD;  Location: [facility] ORS;  Service: Orthopedics;  Laterality: Right;    SOCIAL HISTORY:   Social History   Tobacco Use  . Smoking status: Never Smoker  . Smokeless tobacco: Never Used  Substance Use Topics  . Alcohol use: No    Alcohol/week: 0.0 oz    FAMILY HISTORY:   Family History  Problem Relation Age of Onset  .  Diabetes Daughter     DRUG ALLERGIES:   Allergies  Allergen Reactions  . Fosamax [Alendronate Sodium] Other (See Comments)    Reaction: Unknown    REVIEW OF SYSTEMS:   Review of Systems  Unable to perform ROS: Dementia    MEDICATIONS AT HOME:   Prior to Admission medications   Medication Sig Start Date End Date Taking? Authorizing Provider  acetaminophen (TYLENOL) 325 MG tablet Take by mouth every 4 (four) hours as needed for mild pain, moderate pain or fever.    [provider]  acidophilus (RISAQUAD) CAPS capsule Take 1 capsule by mouth daily. 02/23/15   Katharina Caper, MD  ALPRAZolam Prudy Feeler) 0.25 MG tablet Take 1 tablet (0.25 mg total) by mouth 2 (two) times daily as needed for anxiety. 02/23/15   Katharina Caper, MD  ALPRAZolam Prudy Feeler) 0.5 MG tablet Take 1 tablet (0.5 mg total) by mouth at bedtime. 02/23/15   Katharina Caper, MD  atorvastatin (LIPITOR) 10 MG tablet Take 10 mg by mouth daily at 6 PM.     [provider]  calcium-vitamin D (OSCAL WITH D) 500-200 MG-UNIT tablet Take 1 tablet by mouth 2 (two) times daily. 10/22/14   Altamese Dilling, MD  cephALEXin (KEFLEX) 500 MG capsule Take 1 capsule (500 mg total) by mouth 3 (three) times daily. 02/23/15   Katharina Caper, MD  Cholecalciferol (VITAMIN D3) 50000 units CAPS Take 1 capsule by mouth once a week. Takes  on Saturday    [provider]  cholestyramine light (PREVALITE) 4 g packet Take 1 packet (4 g total) by mouth every 6 (six) hours. 02/23/15   Katharina Caper, MD  clotrimazole (LOTRIMIN) 1 % cream Apply topically 2 (two) times daily. 02/23/15   Katharina Caper, MD  feeding supplement, ENSURE ENLIVE, (ENSURE ENLIVE) LIQD Take 237 mLs by mouth 2 (two) times daily between meals. 02/23/15   Katharina Caper, MD  ferrous sulfate 325 (65 FE) MG tablet Take 1 tablet (325 mg total) by mouth 2 (two) times daily with a meal. 10/22/14   Altamese Dilling, MD  hydrALAZINE (APRESOLINE) 100 MG tablet Take 1  tablet (100 mg total) by mouth every 8 (eight) hours. 02/23/15   Katharina Caper, MD  HYDROcodone-acetaminophen (NORCO/VICODIN) 5-325 MG tablet Take 1 tablet by mouth every 6 (six) hours as needed for moderate pain. 02/23/15   Katharina Caper, MD  levothyroxine (SYNTHROID, LEVOTHROID) 137 MCG tablet Take 137 mcg by mouth daily before breakfast.    [provider]  loperamide (IMODIUM) 2 MG capsule Take 2 mg by mouth every 4 (four) hours as needed for diarrhea or loose stools.     [provider]  Menthol-Zinc Oxide (REMEDY CALAZIME) 0.2-20 % PSTE Apply 1 application topically as needed (skin irritation). Apply to excoriated areas on sacrum    [provider]  metFORMIN (GLUCOPHAGE) 500 MG tablet Take 500 mg by mouth 2 (two) times daily with a meal.    [provider]  metroNIDAZOLE (FLAGYL) 500 MG tablet Take 1 tablet (500 mg total) by mouth 3 (three) times daily. 02/23/15   Katharina Caper, MD  nystatin cream (MYCOSTATIN) Apply 1 application topically as needed (rash). Apply to groin    [provider]  omeprazole (PRILOSEC) 20 MG capsule Take 20 mg by mouth daily.    [provider]  pantoprazole sodium (PROTONIX) 40 mg/20 mL PACK Take 20 mLs (40 mg total) by mouth daily. 02/23/15   Katharina Caper, MD  sodium chloride (OCEAN) 0.65 % SOLN nasal spray Place 2 sprays into both nostrils every 6 (six) hours as needed for congestion.     [provider]  sodium chloride 1 G tablet Take 1 g by mouth 2 (two) times daily with a meal.    [provider]  traZODone (DESYREL) 50 MG tablet Take 50 mg by mouth at bedtime.    [provider]     VITAL SIGNS:  Blood pressure (!) 95/48, pulse (!) 56, temperature 98.2 F (36.8 C), resp. rate (!) 25, height  (1.575 m), weight 68 kg (150 lb), SpO2 99 %.  PHYSICAL EXAMINATION:  Physical Exam  GENERAL:  84 y.o.-year-old patient lying in the bed with no acute distress.  EYES: Pupils  equal, round, reactive to light and accommodation. No scleral icterus. Extraocular muscles intact.  HEENT: Head atraumatic, normocephalic. Oropharynx and nasopharynx clear. No oropharyngeal erythema, moist oral mucosa  NECK:  Supple, no jugular venous distention. No thyroid enlargement, no tenderness.  LUNGS: Normal breath sounds bilaterally, no wheezing, rales, rhonchi. No use of accessory muscles of respiration.  CARDIOVASCULAR: S1, S2, bradycardia ABDOMEN: Soft, nontender, nondistended. Bowel sounds present. No organomegaly or mass.  EXTREMITIES: No pedal edema, cyanosis, or clubbing. + 2 pedal & radial pulses b/l.   NEUROLOGIC: Cranial nerves II through XII are intact. No focal Motor or sensory deficits appreciated b/l PSYCHIATRIC: The patient is alert and awake SKIN: No obvious rash, lesion, or ulcer.   LABORATORY  PANEL:   CBC Recent Labs  Lab 05/02/17 1207  WBC 12.7*  HGB 10.2*  HCT 30.0*  PLT 202   ------------------------------------------------------------------------------------------------------------------  Chemistries  Recent Labs  Lab 05/02/17 1207  NA 123*  K 4.7  CL 97*  CO2 22  GLUCOSE 182*  BUN 21*  CREATININE 1.04*  CALCIUM 8.4*   ------------------------------------------------------------------------------------------------------------------  Cardiac Enzymes Recent Labs  Lab 05/02/17 1207  TROPONINI <0.03   ------------------------------------------------------------------------------------------------------------------  RADIOLOGY:  Dg Chest Portable 1 View  Result Date: 05/02/2017 CLINICAL DATA:  Status post cardiac arrest. EXAM: PORTABLE CHEST 1 VIEW COMPARISON:  Chest x-ray dated February 19, 2015. FINDINGS: The heart size and mediastinal contours are within normal limits. Normal pulmonary vascularity. Atherosclerotic calcification of the aortic arch. Low lung volumes. No focal consolidation, pleural effusion, or pneumothorax. No acute osseous  abnormality. IMPRESSION: No active disease. Electronically Signed   By: Obie Dredge M.D.   On: 05/02/2017 12:34     IMPRESSION AND PLAN:   * Sinus bradycardia Likely due to sick sinus syndrome. But also on verapamil. Hold it. Check TSH. No pacemaker per family. Patient is DNR Discussed with daughters at bedside.  There would not be any change to the treatment plan this point even if her heart rate falls much lower.  We have decided to discontinue the telemetry monitoring.  Patient is weak at this point and will be monitored overnight and discharged back to assisted living facility with hospice following tomorrow.  Consult palliative care.  * HTN Hold verapamil  * Dementia Monitor for inpatient delirium  * DVT prophylaxis TEDs  All the records are reviewed and case discussed with ED provider. Management plans discussed with the patient, family and they are in agreement.  CODE STATUS: DNR  TOTAL TIME TAKING CARE OF THIS PATIENT: 40 minutes.   Molinda Bailiff Bolivar Koranda M.D on 05/02/2017 at 1:16 PM  Between 7am to 6pm - Pager - [phone]  After 6pm go to [email or web address] - password [email or web address] [facility]  [facility] [place] [facility]  Office  [phone]  CC: Primary care physician; Lauro Regulus, MD  Note: This dictation was prepared with Dragon dictation along with smaller phrase technology. Any transcriptional errors that result from this process are unintentional.

## 2017-05-02 NOTE — ED Notes (Signed)
Response from chaplain; states he is at meeting off campus, should be here within an hour.  Family made aware.

## 2017-05-02 NOTE — ED Notes (Signed)
Pt suddenly became unarousable with snoring respirations, frothy sputum coming from mouth.  Pt pulseless for a brief period.  DNR at bedside, CPR not started.  Pt regained pulses spontaneously, continues to be bradycardic, continues with snoring respirations.  EDP at bedside throughout entire event.

## 2017-05-02 NOTE — ED Triage Notes (Signed)
Pt ate breakfast and was acting normally. Pt and daughter were outside and pt became unresponsive. bs 163. Pt is easily arousable and able answer questions but in between she has eyes closed as if asleep

## 2017-05-02 NOTE — ED Provider Notes (Addendum)
[facility] Emergency Department Provider Note   ____________________________________________   First MD Initiated Contact with Patient 05/02/17 1155     (approximate)  I have reviewed the triage vital signs and the nursing notes.   HISTORY  Chief Complaint Loss of Consciousness    HPI Gina [name] is a 84 y.o. female Patient was at [employer]. She had eaten breakfast was outside with her family when she got very faint. She was brought back to recliner noted to pass out and cold and clammy. She woke up was okay for little bit and then passed out again shook a little bit as she was passing out. She was brought in here and was very clammy somnolent but arousable and then became very bradycardic down into the 40s and 30s and for a brief period was pulseless. She came back after about 3 chest compressions at which time we noticed that she had a due DO NOT RESUSCITATE sheet and stopped compressions but by that time the patient resumed her heartbeat blood pressure come up to 114 systolic instead of 88 and her heart rate went up into the 40s again. Gave her half milligram of atropine. I discussed with her family who came that she is a DO NOT RESUSCITATE and they're aware of the 3 chest compressions. They do not want her to go back to [employer] at the present time. They're okay with giving her medicines to try and keep her blood pressure and pulse higher. Her fingerstick was in the 160s on arrival here.we will give her a little bit of fluid as well.   Past Medical History:  Diagnosis Date  . Anxiety   . Dementia   . Diabetes mellitus without complication (HCC)   . GERD (gastroesophageal reflux disease)   . Hypertension   . Hypothyroidism   . Osteoporosis   . Sacral decubitus ulcer     Patient Active Problem List   Diagnosis Date Noted  . Bradycardia 05/02/2017  . Pressure injury of skin 05/02/2017  . Decubitus ulcer of buttock, unstageable  (HCC) 02/23/2015  . Urinary incontinence 02/23/2015  . Diarrhea 02/23/2015  . Staphylococcus aureus bacteremia 02/23/2015  . Sick euthyroidism 02/23/2015  . Diabetes mellitus (HCC) 02/23/2015  . Hyponatremia 02/18/2015  . Wound infection 02/18/2015  . Colo-enteric fistula   . Cellulitis 02/12/2015  . Hip fracture (HCC) 10/20/2014  . Pressure ulcer 10/20/2014  . HYPOTHYROIDISM 04/10/2006  . DIABETES MELLITUS, WITH GASTROPARESIS 04/10/2006  . HYPERCHOLESTEROLEMIA 04/10/2006  . DEMENTIA 04/10/2006  . ANXIETY 04/10/2006  . Essential hypertension 04/10/2006  . ATHEROSCLEROTIC CARDIOVASCULAR DISEASE 04/10/2006  . GERD 04/10/2006  . CONSTIPATION 04/10/2006  . DIABETES MELLITUS, TYPE II 05/09/2005    Past Surgical History:  Procedure Laterality Date  . DEBRIDEMENT AND CLOSURE WOUND Right 02/12/2015   Procedure: DEBRIDEMENT AND CLOSURE WOUND;  Surgeon: Christena Flake, MD;  Location: [facility] ORS;  Service: Orthopedics;  Laterality: Right;  . INTRAMEDULLARY (IM) NAIL INTERTROCHANTERIC Right 10/21/2014   Procedure: INTRAMEDULLARY (IM) NAIL INTERTROCHANTRIC;  Surgeon: Deeann Saint, MD;  Location: [facility] ORS;  Service: Orthopedics;  Laterality: Right;    Prior to Admission medications   Medication Sig Start Date End Date Taking? Authorizing Provider  acidophilus (RISAQUAD) CAPS capsule Take 1 capsule by mouth daily. 02/23/15  Yes Katharina Caper, MD  ALPRAZolam Prudy Feeler) 0.25 MG tablet Take 1 tablet (0.25 mg total) by mouth 2 (two) times daily as needed for anxiety. Patient taking differently: Take 0.25 mg by mouth daily.  02/23/15  Yes Katharina Caper, MD  calcium-vitamin D (OSCAL WITH D) 500-200 MG-UNIT tablet Take 1 tablet by mouth 2 (two) times daily. 10/22/14  Yes Altamese Dilling, MD  Cholecalciferol (VITAMIN D3) 50000 units CAPS Take 1 capsule by mouth once a week. Takes on Saturday   Yes [provider]  feeding supplement, ENSURE ENLIVE, (ENSURE ENLIVE) LIQD Take 237 mLs by mouth  2 (two) times daily between meals. 02/23/15  Yes Katharina Caper, MD  ferrous sulfate 325 (65 FE) MG tablet Take 1 tablet (325 mg total) by mouth 2 (two) times daily with a meal. 10/22/14  Yes Altamese Dilling, MD  furosemide (LASIX) 20 MG tablet Take 10 mg by mouth every other day.   Yes [provider]  levothyroxine (SYNTHROID, LEVOTHROID) 175 MCG tablet Take 175 mcg by mouth daily before breakfast.    Yes [provider]  lisinopril (PRINIVIL,ZESTRIL) 5 MG tablet Take 5 mg by mouth daily.   Yes [provider]  Melatonin 3 MG TABS Take 1 tablet by mouth at bedtime.   Yes [provider]  metFORMIN (GLUCOPHAGE) 500 MG tablet Take 1,000 mg by mouth 2 (two) times daily with a meal.    Yes [provider]  montelukast (SINGULAIR) 10 MG tablet Take 10 mg by mouth at bedtime.   Yes [provider]  Multiple Vitamin (MULTIVITAMIN) capsule Take 1 capsule by mouth daily.   Yes [provider]  omeprazole (PRILOSEC) 20 MG capsule Take 20 mg by mouth daily.   Yes [provider]  sodium chloride 1 G tablet Take 1 g by mouth 2 (two) times daily with a meal.   Yes [provider]  Vitamin D, Ergocalciferol, (DRISDOL) 50000 units CAPS capsule Take 50,000 Units by mouth every 7 (seven) days.   Yes [provider]  acetaminophen (TYLENOL) 325 MG tablet Take by mouth every 4 (four) hours as needed for mild pain, moderate pain or fever.    [provider]  ALPRAZolam Prudy Feeler) 0.5 MG tablet Take 1 tablet (0.5 mg total) by mouth at bedtime. Patient not taking: Reported on 05/02/2017 02/23/15   Katharina Caper, MD  cephALEXin (KEFLEX) 500 MG capsule Take 1 capsule (500 mg total) by mouth 3 (three) times daily. Patient not taking: Reported on 05/02/2017 02/23/15   Katharina Caper, MD  cholestyramine light (PREVALITE) 4 g packet Take 1 packet (4 g total) by mouth every 6 (six) hours. Patient not taking: Reported on 05/02/2017  02/23/15   Katharina Caper, MD  clotrimazole (LOTRIMIN) 1 % cream Apply topically 2 (two) times daily. Patient not taking: Reported on 05/02/2017 02/23/15   Katharina Caper, MD  hydrALAZINE (APRESOLINE) 100 MG tablet Take 1 tablet (100 mg total) by mouth every 8 (eight) hours. Patient not taking: Reported on 05/02/2017 02/23/15   Katharina Caper, MD  HYDROcodone-acetaminophen (NORCO/VICODIN) 5-325 MG tablet Take 1 tablet by mouth every 6 (six) hours as needed for moderate pain. Patient not taking: Reported on 05/02/2017 02/23/15   Katharina Caper, MD  loperamide (IMODIUM) 2 MG capsule Take 2 mg by mouth every 4 (four) hours as needed for diarrhea or loose stools.     [provider]  metroNIDAZOLE (FLAGYL) 500 MG tablet Take 1 tablet (500 mg total) by mouth 3 (three) times daily. Patient not taking: Reported on 05/02/2017 02/23/15   Katharina Caper, MD  pantoprazole sodium (PROTONIX) 40 mg/20 mL PACK Take 20 mLs (40 mg total) by mouth daily. Patient not taking: Reported on 05/02/2017  02/23/15   Katharina Caper, MD    Allergies Fosamax [alendronate sodium]  Family History  Problem Relation Age of Onset  . Diabetes Daughter     Social History Social History   Tobacco Use  . Smoking status: Never Smoker  . Smokeless tobacco: Never Used  Substance Use Topics  . Alcohol use: No    Alcohol/week: 0.0 oz  . Drug use: No    Review of Systems  Constitutional: No fever/chills Eyes: No visual changes. ENT: No sore throat. Cardiovascular: patient reports chest is a little bit tight.Marland Kitchen Respiratory: Denies shortness of breath. Gastrointestinal: No abdominal pain.  No nausea, no vomiting.  No diarrhea.  No constipation. Genitourinary: Negative for dysuria. Musculoskeletal: Negative for back pain. Skin: Negative for rash. Neurological: Negative for headaches, focal weakness   ____________________________________________   PHYSICAL EXAM:  VITAL SIGNS: ED Triage Vitals  Enc Vitals Group     BP  05/02/17 1155 (!) 95/53     Pulse Rate 05/02/17 1155 (!) 50     Resp 05/02/17 1155 (!) 25     Temp 05/02/17 1155 98.2 F (36.8 C)     Temp src --      SpO2 05/02/17 1155 97 %     Weight 05/02/17 1157 150 lb (68 kg)     Height 05/02/17 1157  (1.575 m)     Head Circumference --      Peak Flow --      Pain Score 05/02/17 1157 0     Pain Loc --      Pain Edu? --      Excl. in GC? --     Constitutional: at times patient isAlert and oriented. Well appearing and in no acute distress.at other times she is unresponsiveat other times she is asking where her oatmeal went to and is groggy. Eyes: Conjunctivae are normal.  Head: Atraumatic. Nose: No congestion/rhinnorhea. Mouth/Throat: Mucous membranes are moist.  Oropharynx non-erythematous. Neck: No stridor.   Cardiovascular: Normal rate, regular rhythm. Grossly normal heart sounds.  Good peripheral circulation. Respiratory: Normal respiratory effort.  No retractions. Lungs CTAB. Gastrointestinal: Soft and nontender. No distention. No abdominal bruits. Musculoskeletal: No lower extremity tenderness nor edema.   Neurologic:  Normal speech and language. No gross focal neurologic deficits are appreciated.  Skin:  Skin is warm, dry and intact. No rash noted. Psychiatric: Mood and affect are normal. Speech and behavior are normal.at times  ____________________________________________   LABS (all labs ordered are listed, but only abnormal results are displayed)  Labs Reviewed  GLUCOSE, CAPILLARY - Abnormal; Notable for the following components:      Result Value   Glucose-Capillary 186 (*)    All other components within normal limits  BASIC METABOLIC PANEL - Abnormal; Notable for the following components:   Sodium 123 (*)    Chloride 97 (*)    Glucose, Bld 182 (*)    BUN 21 (*)    Creatinine, Ser 1.04 (*)    Calcium 8.4 (*)    GFR calc non Af Amer 44 (*)    GFR calc Af Amer 50 (*)    Anion gap 4 (*)    All other components  within normal limits  CBC - Abnormal; Notable for the following components:   WBC 12.7 (*)    RBC 3.19 (*)    Hemoglobin 10.2 (*)    HCT 30.0 (*)    All other components within normal limits  TSH - Abnormal; Notable for the following  components:   TSH 7.330 (*)    All other components within normal limits  ALBUMIN - Abnormal; Notable for the following components:   Albumin 2.1 (*)    All other components within normal limits  MRSA PCR SCREENING  TROPONIN I  URINALYSIS, COMPLETE (UACMP) WITH MICROSCOPIC  CBG MONITORING, ED   ____________________________________________  EKG EKG #1 read and interpreted by me shows heart rate of 55 which appears to be sinus. This difficult to tell because of baseline is very irregular. the computer reads  A. fib but I do not believe that is true. left axis there are flipped T's in III and F no obvious ST segment elevationEKG #2 read and interpreted by me shows sinus bradycardia rate of 49 left axis first degree AV block left anterior hemiblock. Computer otherwise no obvious acute changes there have been flipped T's in III and F on both of these EKGs. ____________________________________________  RADIOLOGY  ED MD interpretation: chest x-ray reviewed by me is not a very deep inspiratory film but I do not see any acute changes per radiologist read the film.  Official radiology report(s): Dg Chest Portable 1 View  Result Date: 05/02/2017 CLINICAL DATA:  Status post cardiac arrest. EXAM: PORTABLE CHEST 1 VIEW COMPARISON:  Chest x-ray dated February 19, 2015. FINDINGS: The heart size and mediastinal contours are within normal limits. Normal pulmonary vascularity. Atherosclerotic calcification of the aortic arch. Low lung volumes. No focal consolidation, pleural effusion, or pneumothorax. No acute osseous abnormality. IMPRESSION: No active disease. Electronically Signed   By: Obie Dredge M.D.   On: 05/02/2017 12:34     ____________________________________________   PROCEDURES  Procedure(s) performed:   Procedures  Critical Care performed:  critical care time half an hour. This includes monitoring the patient ordering the atropine watching to see if it were discussing the patient with hospital doctor and familyand reviewing her old records. ____________________________________________   INITIAL IMPRESSION / ASSESSMENT AND PLAN / ED COURSE  patient given half milligram atropine pressure and pulse recover somewhat. The patient's sodium is 123 we'll give her some IV saline here soon as the rest lab work is back I will contact the hospital doctor and we will get her upstairs. We'll get a palliative care consult ordered as well.I have discussed the patient with her family. Patient was present. They'll agreed that at this point they would not want her to get a pacemaker placed or have any other invasive things done. We will try and treat her with medications razor serum sodium and see if that helps. Otherwise she will get palliative care.         ____________________________________________   FINAL CLINICAL IMPRESSION(S) / ED DIAGNOSES  Final diagnoses:  Syncope and collapse     ED Discharge Orders    None       Note:  This document was prepared using Dragon voice recognition software and may include unintentional dictation errors.    Arnaldo Natal, MD 05/02/17 2015    Arnaldo Natal, MD 05/02/17 2016

## 2017-05-02 NOTE — ED Notes (Addendum)
Attempted to call report, per Duwayne Heck, 1C Charge RN, no RN assignment at this time.

## 2017-05-02 NOTE — Progress Notes (Signed)
Chaplain responded to a page from nurse for end of life support. Chaplain was off campus for education. Upon return chaplain checked on Pt with 2 daughters at bedside  Bonita Quin and [name]). Chaplain offered support and prayer. Chaplain prayed a prayer for comfort and peace for the Pt and family. Chaplain played Pt favorite song (In the Garden). Chaplain receive another page. Chaplain will follow up.    05/02/17 1300  Clinical Encounter Type  Visited With Patient and family together  Visit Type Initial;Patient actively dying  Referral From Nurse  Spiritual Encounters  Spiritual Needs Prayer;Grief support

## 2017-05-02 NOTE — ED Notes (Signed)
Hospitalist to bedside at this time 

## 2017-05-02 NOTE — Progress Notes (Signed)
New hospice home referral received from CSW Windell Moulding following a Palliative Medicine consult in the ED. Patient information faxed to referral. Writer to follow up with patient and family in the morning to assess stability for transport and  bed availability. Dayna Barker RN, BSN, [facility]

## 2017-05-02 NOTE — Consult Note (Signed)
Consultation Note Date: 05/02/2017   Patient Name: Gina [name]  DOB: 03-05-1917  MRN: [id number]  Age / Sex: 84 y.o., female  PCP: Kirk Ruths, MD Referring Physician: Hillary Bow, MD  Reason for Consultation: Establishing goals of care  HPI/Patient Profile: 84 y.o. female admitted from [facility] on 05/02/2017 with syncopal episode and unresponsiveness. She has a past medical history of  Diabetes, dementia, anxiety, GERD, hypertension, hypothyroidism, osteoporosis, and sacral decubitus ulcer. Her two daughters are at the bedside and provides history. The patient is a DNR/DNI. Daughters verbalized at the facility patient was alert and doing well outside, however she became lethargic and was taken back in and placed in recliner. Several minutes after she was noted to have jerking motions and became unresponsive. Caregivers shifted her back to the bed where she appeared to be resting, however she then began with jerking motions again and became clammy. During her ED course patient experienced an episode of loud snoring respirations, frothy sputum, and became unarousable. She experienced a prolonged pause without pulse. She then regained spontaneous bradycardic pulse. EDP and RN was at bedside during event. Family has verbalized their desire for no aggressive interventions including pacemaker or work-ups. Patient has dementia and daughters verbalized she is unable to recognize them and refers to them as her best friends. Palliative Medicine team consulted for goals of care discussion.   Clinical Assessment and Goals of Care: I have reviewed medical records including lab results, imaging, Epic notes, and MAR, received report from the bedside RN, and assessed the patient. I then met at the bedside with patient's two daughters Lelon Frohlich & Vaughan Basta) to discuss diagnosis prognosis, [place], EOL wishes, disposition and  options. Patient has history of dementia. She is lethargic and snoring. Aroused with touch, however, unable to participate in goals of care discussion.   I introduced Palliative Medicine as specialized medical care for people living with serious illness. It focuses on providing relief from the symptoms and stress of a serious illness. The goal is to improve quality of life for both the patient and the family.  We discussed a brief life review of the patient. 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 Their father passed away a few years ago at [facility]. She has a strong faith back ground ([place]). Ms. Stumpp loved singing and being outside. 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   As far as functional and nutritional status. 10 years ago she resided at [facility] until she broke her hip about 2 years ago. At that time she was moved to [facility] due to an increased need of care and mobility concerns. Her daughters state once she broke her hip the surgeons felt that surgical intervention was not the best option for her and given her age and dementia she would not be the best candidate to participate in rehabilitation. Since her fall 2  years ago she has been wheelchair bound and transfers were completed with a lift, due to her inability to bear weight. Family verbalizes noticing a decline over the past 6-9 months. Her dementia began worsening, she has become incontinent, and relies on total assistance with ADLs. She could feed herself finger foods up until about a month ago, and then she would only take small bites and stop eating. If she is not fed she generally would not eat much. Family states over the past week, her appetite has decreased and she seems somewhat weaker (sleeping more).   We discussed her current illness and what it means in the larger context of her  on-going co-morbidities.  Natural disease trajectory and expectations at EOL were discussed.  I attempted to elicit values and goals of care important to the patient.    The difference between aggressive medical intervention and comfort care was considered in light of the patient's goals of care. Her daughters verbalized, they were aware of her age and was not interested in any forms of aggressive care, including lab work or radiology testing. We discussed in detail what comfort care would look like, no telemetry, antibiotics, radiology, lab testing, however, all symptoms such as agitation/anxiety, nausea, shortness of breath, pain, etc. Would be treated aggressively. Daughters verbalized understanding and that full comfort care is what they wanted for their mother at this time. Their goals for her is to not suffer, have all symptoms managed, and for her to have a comfortable and peaceful end of life transition as much as possible.    Advanced directives, concepts specific to code status, artifical feeding and hydration, and rehospitalization were considered and discussed. She is a DNR/DNI and daughters expressed this as her wishes. They are not interested in any forms of artificial feedings such as PEG tubes, or rehospitalization.   Hospice and Palliative Care services outpatient were explained and offered. Daughters verbalized their awareness of the hospice home of [facility]. Their father and Ms. Speckman husband passed away at this location. At this time they are requesting for her to be placed at this facility if available for end of life care. They became tearful during conversation, and expressed their wishes for her not to return to [facility] as she would not want to pass away their if she had and option. We discussed the goals of hospice and family verbalized understanding.   Questions and concerns were addressed. The family was encouraged to call with questions or concerns.  PMT will continue to  support holistically.  HCPOA-daughters [name] Combs and [name] Martinique.     SUMMARY OF RECOMMENDATIONS    DNR/DNI  FULL COMFORT CARE at family's request. No aggressive measures, only symptom management.   Social Work consult for residential hospice placement at family's request.   Robinul PRN for secretions  Morphine/Roxanol PRN for pain/dyspnea  Ativan PRN for agitation/anxiety/seizures  Zofran PRN for nausea/vomiting  Palliative Medicine will continue to support patient, family, and medical team as needed.   Code Status/Advance Care Planning:  DNR/DNI per family   Symptom Management:   Morphine/Roxanol PRN pain/dyspne  Ativan PRN for agitation/anxiety/seizures  Zofran PRN for nausea/vomiting  Robinul PRN for secretions  Tylenol PRN for pain/fever  Palliative Prophylaxis:   Aspiration, Bowel Regimen, Delirium Protocol, Eye Care, Frequent Pain Assessment, Oral Care, Palliative Wound Care and Turn Reposition  Additional Recommendations (Limitations, Scope, Preferences):  Full Comfort Care per family's request. No aggressive measures at this time.   Psycho-social/Spiritual:   Desire for further  Chaplaincy support:YES  Prognosis:   < 2 weeks in the setting of bradycardia (no interventions), full comfort measures, hypertension, diabetes, advanced dementia, poor po intake, immobility, and albumin 2.1.   Discharge Planning: Hospice facility at family's request.       Primary Diagnoses: Present on Admission: . Bradycardia   I have reviewed the medical record, interviewed the patient and family, and examined the patient. The following aspects are pertinent.  Past Medical History:  Diagnosis Date  . Anxiety   . Dementia   . Diabetes mellitus without complication ([place])   . GERD (gastroesophageal reflux disease)   . Hypertension   . Hypothyroidism   . Osteoporosis   . Sacral decubitus ulcer    Social History   Socioeconomic History  . Marital status:  Married    Spouse name: Not on file  . Number of children: Not on file  . Years of education: Not on file  . Highest education level: Not on file  Occupational History  . Occupation: retired  Scientific laboratory technician  . Financial resource strain: Patient refused  . Food insecurity:    Worry: Patient refused    Inability: Patient refused  . Transportation needs:    Medical: Patient refused    Non-medical: Patient refused  Tobacco Use  . Smoking status: Never Smoker  . Smokeless tobacco: Never Used  Substance and Sexual Activity  . Alcohol use: No    Alcohol/week: 0.0 oz  . Drug use: No  . Sexual activity: Never    Birth control/protection: Post-menopausal  Lifestyle  . Physical activity:    Days per week: Patient refused    Minutes per session: Patient refused  . Stress: Patient refused  Relationships  . Social connections:    Talks on phone: Patient refused    Gets together: Patient refused    Attends religious service: Patient refused    Active member of club or organization: Patient refused    Attends meetings of clubs or organizations: Patient refused    Relationship status: Patient refused  Other Topics Concern  . Not on file  Social History Narrative   Resident of nursing home   Family History  Problem Relation Age of Onset  . Diabetes Daughter    Scheduled Meds: . sodium chloride flush  3 mL Intravenous Q12H   Continuous Infusions: PRN Meds:.acetaminophen **OR** acetaminophen, albuterol, glycopyrrolate **OR** glycopyrrolate **OR** glycopyrrolate, LORazepam **OR** LORazepam **OR** LORazepam, morphine injection, morphine CONCENTRATE **OR** morphine CONCENTRATE, ondansetron **OR** ondansetron (ZOFRAN) IV, polyethylene glycol, polyvinyl alcohol Medications Prior to Admission:  Prior to Admission medications   Medication Sig Start Date End Date Taking? Authorizing Provider  acidophilus (RISAQUAD) CAPS capsule Take 1 capsule by mouth daily. 02/23/15  Yes Theodoro Grist, MD    ALPRAZolam Duanne Moron) 0.25 MG tablet Take 1 tablet (0.25 mg total) by mouth 2 (two) times daily as needed for anxiety. Patient taking differently: Take 0.25 mg by mouth daily.  02/23/15  Yes Theodoro Grist, MD  calcium-vitamin D (OSCAL WITH D) 500-200 MG-UNIT tablet Take 1 tablet by mouth 2 (two) times daily. 10/22/14  Yes Vaughan Basta, MD  Cholecalciferol (VITAMIN D3) 50000 units CAPS Take 1 capsule by mouth once a week. Takes on Saturday   Yes [provider]  feeding supplement, ENSURE ENLIVE, (ENSURE ENLIVE) LIQD Take 237 mLs by mouth 2 (two) times daily between meals. 02/23/15  Yes Theodoro Grist, MD  ferrous sulfate 325 (65 FE) MG tablet Take 1 tablet (325 mg total) by mouth 2 (two) times  daily with a meal. 10/22/14  Yes Vaughan Basta, MD  furosemide (LASIX) 20 MG tablet Take 10 mg by mouth every other day.   Yes [provider]  levothyroxine (SYNTHROID, LEVOTHROID) 175 MCG tablet Take 175 mcg by mouth daily before breakfast.    Yes [provider]  lisinopril (PRINIVIL,ZESTRIL) 5 MG tablet Take 5 mg by mouth daily.   Yes [provider]  Melatonin 3 MG TABS Take 1 tablet by mouth at bedtime.   Yes [provider]  metFORMIN (GLUCOPHAGE) 500 MG tablet Take 1,000 mg by mouth 2 (two) times daily with a meal.    Yes [provider]  montelukast (SINGULAIR) 10 MG tablet Take 10 mg by mouth at bedtime.   Yes [provider]  Multiple Vitamin (MULTIVITAMIN) capsule Take 1 capsule by mouth daily.   Yes [provider]  omeprazole (PRILOSEC) 20 MG capsule Take 20 mg by mouth daily.   Yes [provider]  sodium chloride 1 G tablet Take 1 g by mouth 2 (two) times daily with a meal.   Yes [provider]  Vitamin D, Ergocalciferol, (DRISDOL) 50000 units CAPS capsule Take 50,000 Units by mouth every 7 (seven) days.   Yes [provider]  acetaminophen (TYLENOL) 325 MG tablet Take by mouth  every 4 (four) hours as needed for mild pain, moderate pain or fever.    [provider]  ALPRAZolam Duanne Moron) 0.5 MG tablet Take 1 tablet (0.5 mg total) by mouth at bedtime. Patient not taking: Reported on 05/02/2017 02/23/15   Theodoro Grist, MD  cephALEXin (KEFLEX) 500 MG capsule Take 1 capsule (500 mg total) by mouth 3 (three) times daily. Patient not taking: Reported on 05/02/2017 02/23/15   Theodoro Grist, MD  cholestyramine light (PREVALITE) 4 g packet Take 1 packet (4 g total) by mouth every 6 (six) hours. Patient not taking: Reported on 05/02/2017 02/23/15   Theodoro Grist, MD  clotrimazole (LOTRIMIN) 1 % cream Apply topically 2 (two) times daily. Patient not taking: Reported on 05/02/2017 02/23/15   Theodoro Grist, MD  hydrALAZINE (APRESOLINE) 100 MG tablet Take 1 tablet (100 mg total) by mouth every 8 (eight) hours. Patient not taking: Reported on 05/02/2017 02/23/15   Theodoro Grist, MD  HYDROcodone-acetaminophen (NORCO/VICODIN) 5-325 MG tablet Take 1 tablet by mouth every 6 (six) hours as needed for moderate pain. Patient not taking: Reported on 05/02/2017 02/23/15   Theodoro Grist, MD  loperamide (IMODIUM) 2 MG capsule Take 2 mg by mouth every 4 (four) hours as needed for diarrhea or loose stools.     [provider]  metroNIDAZOLE (FLAGYL) 500 MG tablet Take 1 tablet (500 mg total) by mouth 3 (three) times daily. Patient not taking: Reported on 05/02/2017 02/23/15   Theodoro Grist, MD  pantoprazole sodium (PROTONIX) 40 mg/20 mL PACK Take 20 mLs (40 mg total) by mouth daily. Patient not taking: Reported on 05/02/2017 02/23/15   Theodoro Grist, MD   Allergies  Allergen Reactions  . Fosamax [Alendronate Sodium] Other (See Comments)    Reaction: Unknown   Review of Systems  Unable to perform ROS: Dementia    Physical Exam  Constitutional: She appears well-developed. She appears lethargic. She appears ill.  Cardiovascular: Intact distal pulses. An irregular rhythm present. Bradycardia  present.  Murmur heard. Pulmonary/Chest: Effort normal. She has decreased breath sounds.  Abdominal: Soft. Normal appearance and bowel sounds are normal.  Musculoskeletal:  Wheelchair bound   Neurological: She appears lethargic. She is disoriented.  Psychiatric: Cognition and memory are impaired. She expresses inappropriate judgment.  Lethargic with min. Response   Nursing note and vitals reviewed.   Vital Signs: BP (!) 110/54   Pulse (!) 47   Temp 98.2 F (36.8 C)   Resp (!) 21   Ht '5\' 2"'$  (1.575 m)   Wt 68 kg (150 lb)   SpO2 100%   BMI 27.44 kg/m  Pain Scale: 0-10   Pain Score: 0-No pain   SpO2: SpO2: 100 % O2 Device:SpO2: 100 % O2 Flow Rate: .   IO: Intake/output summary:   Intake/Output Summary (Last 24 hours) at 05/02/2017 1546 Last data filed at 05/02/2017 1354 Gross per 24 hour  Intake 500 ml  Output -  Net 500 ml    LBM:   Baseline Weight: Weight: 68 kg (150 lb) Most recent weight: Weight: 68 kg (150 lb)     Palliative Assessment/Data:PPS 20%   Time In: 1300 Time Out: 1430 Time Total: 90 min.  Greater than 50%  of this time was spent counseling and coordinating care related to the above assessment and plan.  Signed by: Alda Lea, NP-BC Palliative Medicine Team  Phone: [phone] Fax: [phone]   Please contact Palliative Medicine Team phone at [phone] for questions and concerns.  For individual provider: See Shea Evans

## 2017-05-02 NOTE — Clinical Social Work Note (Signed)
Patient's family requesting hospice facility placement.  CSW provided choice of hospice facilities, and patient's [facility] [address] [facility] [address].  CSW gave referral to nurse liaison Dayna Barker, she will review patient's information and follow up with family in the morning.  Gina [name]. Gina [name], MSW, [facility] [phone]  05/02/2017 5:30 PM

## 2017-05-03 DIAGNOSIS — R001 Bradycardia, unspecified: Secondary | ICD-10-CM | POA: Diagnosis not present

## 2017-05-03 MED ORDER — POLYETHYLENE GLYCOL 3350 17 G PO PACK: 17.0000 g | PACK | Freq: Every day | ORAL | 0 refills | Status: AC | PRN

## 2017-05-03 MED ORDER — LORAZEPAM 2 MG/ML PO CONC: 0.5000 mg | ORAL | 0 refills | Status: AC | PRN

## 2017-05-03 MED ORDER — MORPHINE SULFATE (CONCENTRATE) 10 MG/0.5ML PO SOLN: 5.0000 mg | ORAL | 0 refills | Status: AC | PRN

## 2017-05-03 MED ORDER — GLYCOPYRROLATE 1 MG PO TABS: 1.0000 mg | ORAL_TABLET | ORAL | 0 refills | Status: AC | PRN

## 2017-05-03 NOTE — Discharge Summary (Signed)
[facility] [place] at [facility]   PATIENT NAME: Gina [name]    MR#:  [id number]  DATE OF BIRTH:  [birth year]  DATE OF ADMISSION:  05/02/2017 ADMITTING PHYSICIAN: Milagros Loll, MD  DATE OF DISCHARGE: 05/03/2017  PRIMARY CARE PHYSICIAN: Lauro Regulus, MD    ADMISSION DIAGNOSIS:  Syncope and collapse [R55]  DISCHARGE DIAGNOSIS:  Active Problems:   Bradycardia   Pressure injury of skin   SECONDARY DIAGNOSIS:   Past Medical History:  Diagnosis Date  . Anxiety   . Dementia   . Diabetes mellitus without complication (HCC)   . GERD (gastroesophageal reflux disease)   . Hypertension   . Hypothyroidism   . Osteoporosis   . Sacral decubitus ulcer     HOSPITAL COURSE:   * Sinus bradycardia Likely due to sick sinus syndrome. But also on verapamil. Hold it. Check TSH. No pacemaker per family. Patient is DNR Discussed with daughters at bedside.  There would not be any change to the treatment plan this point even if her heart rate falls much lower.  We have decided to discontinue the telemetry monitoring.  Patient is weak at this point and will be monitored overnight and discharged back to assisted living facility with hospice following tomorrow.  Consult palliative care.  Family agreed on Hospice home admission, discharge today.  * HTN Hold verapamil  * Dementia Monitor for inpatient delirium  * DVT prophylaxis TEDs    DISCHARGE CONDITIONS:   Stable.  CONSULTS OBTAINED:    DRUG ALLERGIES:   Allergies  Allergen Reactions  . Fosamax [Alendronate Sodium] Other (See Comments)    Reaction: Unknown    DISCHARGE MEDICATIONS:   Allergies as of 05/03/2017      Reactions   Fosamax [alendronate Sodium] Other (See Comments)   Reaction: Unknown      Medication List    STOP taking these medications   acidophilus Caps capsule   ALPRAZolam 0.25 MG tablet Commonly known as:  XANAX   ALPRAZolam 0.5 MG tablet Commonly  known as:  XANAX   calcium-vitamin D 500-200 MG-UNIT tablet Commonly known as:  OSCAL WITH D   cephALEXin 500 MG capsule Commonly known as:  KEFLEX   cholestyramine light 4 g packet Commonly known as:  PREVALITE   clotrimazole 1 % cream Commonly known as:  LOTRIMIN   feeding supplement (ENSURE ENLIVE) Liqd   ferrous sulfate 325 (65 FE) MG tablet   furosemide 20 MG tablet Commonly known as:  LASIX   hydrALAZINE 100 MG tablet Commonly known as:  APRESOLINE   HYDROcodone-acetaminophen 5-325 MG tablet Commonly known as:  NORCO/VICODIN   levothyroxine 175 MCG tablet Commonly known as:  SYNTHROID, LEVOTHROID   lisinopril 5 MG tablet Commonly known as:  PRINIVIL,ZESTRIL   loperamide 2 MG capsule Commonly known as:  IMODIUM   Melatonin 3 MG Tabs   metFORMIN 500 MG tablet Commonly known as:  GLUCOPHAGE   metroNIDAZOLE 500 MG tablet Commonly known as:  FLAGYL   montelukast 10 MG tablet Commonly known as:  SINGULAIR   multivitamin capsule   omeprazole 20 MG capsule Commonly known as:  PRILOSEC   pantoprazole sodium 40 mg/20 mL Pack Commonly known as:  PROTONIX   sodium chloride 1 g tablet   Vitamin D (Ergocalciferol) 50000 units Caps capsule Commonly known as:  DRISDOL   Vitamin D3 50000 units Caps     TAKE these medications   acetaminophen 325 MG tablet Commonly known as:  TYLENOL Take by  mouth every 4 (four) hours as needed for mild pain, moderate pain or fever.   glycopyrrolate 1 MG tablet Commonly known as:  ROBINUL Take 1 tablet (1 mg total) by mouth every 4 (four) hours as needed (excessive secretions).   LORazepam 2 MG/ML concentrated solution Commonly known as:  ATIVAN Place 0.3-0.5 mLs (0.6-1 mg total) under the tongue every 4 (four) hours as needed for anxiety or seizure.   morphine CONCENTRATE 10 MG/0.5ML Soln concentrated solution Take 0.25 mLs (5 mg total) by mouth every 2 (two) hours as needed for moderate pain (or dyspnea).    polyethylene glycol packet Commonly known as:  MIRALAX / GLYCOLAX Take 17 g by mouth daily as needed for mild constipation.        DISCHARGE INSTRUCTIONS:    Follow with hospice for comfort care.  If you experience worsening of your admission symptoms, develop shortness of breath, life threatening emergency, suicidal or homicidal thoughts you must seek medical attention immediately by calling 911 or calling your MD immediately  if symptoms less severe.  You Must read complete instructions/literature along with all the possible adverse reactions/side effects for all the Medicines you take and that have been prescribed to you. Take any new Medicines after you have completely understood and accept all the possible adverse reactions/side effects.   Please note  You were cared for by a hospitalist during your hospital stay. If you have any questions about your discharge medications or the care you received while you were in the hospital after you are discharged, you can call the unit and asked to speak with the hospitalist on call if the hospitalist that took care of you is not available. Once you are discharged, your primary care physician will handle any further medical issues. Please note that NO REFILLS for any discharge medications will be authorized once you are discharged, as it is imperative that you return to your primary care physician (or establish a relationship with a primary care physician if you do not have one) for your aftercare needs so that they can reassess your need for medications and monitor your lab values.    Today   CHIEF COMPLAINT:   Chief Complaint  Patient presents with  . Loss of Consciousness    HISTORY OF PRESENT ILLNESS:  Gina [name]  is a 84 y.o. female with a known history of hypertension, dementia who is a resident of assisted living facility presented to the emergency room after having an episode of syncope.  Here patient had a prolonged pause.   Had 3 chest compressions and woke up.  Was also apneic.  Patient is DNR.  Daughter is at bedside have decided not to have any pacemaker placed. Patient at baseline is bed and wheelchair bound.  Has dementia.  Recognizes her daughters who visit her often but thinks they are her friends.   VITAL SIGNS:  Blood pressure 94/60, pulse (!) 52, temperature 97.6 F (36.4 C), temperature source Axillary, resp. rate 16, height  (1.575 m), weight 62.1 kg (136 lb 14.4 oz), SpO2 100 %.  I/O:    Intake/Output Summary (Last 24 hours) at 05/03/2017 1025 Last data filed at 05/03/2017 0900 Gross per 24 hour  Intake 740 ml  Output -  Net 740 ml    PHYSICAL EXAMINATION:   GENERAL:  84 y.o.-year-old patient lying in the bed with no acute distress.  EYES: Pupils equal, round, reactive to light and accommodation. No scleral icterus. Extraocular muscles intact.  HEENT: Head  atraumatic, normocephalic. Oropharynx and nasopharynx clear. No oropharyngeal erythema, moist oral mucosa  NECK:  Supple, no jugular venous distention. No thyroid enlargement, no tenderness.  LUNGS: Normal breath sounds bilaterally, no wheezing, rales, rhonchi. No use of accessory muscles of respiration.  CARDIOVASCULAR: S1, S2, bradycardia ABDOMEN: Soft, nontender, nondistended. Bowel sounds present. No organomegaly or mass.  EXTREMITIES: No pedal edema, cyanosis, or clubbing. + 2 pedal & radial pulses b/l.   NEUROLOGIC: Cranial nerves II through XII are intact. No focal Motor or sensory deficits appreciated b/l PSYCHIATRIC: The patient is alert and awake SKIN: No obvious rash, lesion, or ulcer.    DATA REVIEW:   CBC Recent Labs  Lab 05/02/17 1207  WBC 12.7*  HGB 10.2*  HCT 30.0*  PLT 202    Chemistries  Recent Labs  Lab 05/02/17 1207  NA 123*  K 4.7  CL 97*  CO2 22  GLUCOSE 182*  BUN 21*  CREATININE 1.04*  CALCIUM 8.4*    Cardiac Enzymes Recent Labs  Lab 05/02/17 1207  TROPONINI <0.03    Microbiology  Results  Results for orders placed or performed during the hospital encounter of 05/02/17  MRSA PCR Screening     Status: None   Collection Time: 05/02/17  7:40 PM  Result Value Ref Range Status   MRSA by PCR NEGATIVE NEGATIVE Final    Comment:        The GeneXpert MRSA Assay (FDA approved for NASAL specimens only), is one component of a comprehensive MRSA colonization surveillance program. It is not intended to diagnose MRSA infection nor to guide or monitor treatment for MRSA infections. Performed at [facility], [address] [place], [place] [address]     RADIOLOGY:  Dg Chest Portable 1 View  Result Date: 05/02/2017 CLINICAL DATA:  Status post cardiac arrest. EXAM: PORTABLE CHEST 1 VIEW COMPARISON:  Chest x-ray dated February 19, 2015. FINDINGS: The heart size and mediastinal contours are within normal limits. Normal pulmonary vascularity. Atherosclerotic calcification of the aortic arch. Low lung volumes. No focal consolidation, pleural effusion, or pneumothorax. No acute osseous abnormality. IMPRESSION: No active disease. Electronically Signed   By: Obie Dredge M.D.   On: 05/02/2017 12:34    EKG:   Orders placed or performed during the hospital encounter of 05/02/17  . EKG 12-Lead  . EKG 12-Lead  . ED EKG  . ED EKG  . EKG 12-Lead  . EKG 12-Lead  . EKG 12-Lead  . EKG 12-Lead  . EKG 12-Lead  . EKG 12-Lead      Management plans discussed with the patient, family and they are in agreement.  CODE STATUS:     Code Status Orders  (From admission, onward)        Start     Ordered   05/02/17 1519  Do not attempt resuscitation (DNR)  Continuous    Question Answer Comment  In the event of cardiac or respiratory ARREST Do not call a "code blue"   In the event of cardiac or respiratory ARREST Do not perform Intubation, CPR, defibrillation or ACLS   In the event of cardiac or respiratory ARREST Use medication by any route, position, wound care, and  other measures to relive pain and suffering. May use oxygen, suction and manual treatment of airway obstruction as needed for comfort.      05/02/17 1544    Code Status History    Date Active Date Inactive Code Status Order ID Comments User Context   05/02/2017  1316 05/02/2017 1544 DNR [id number]  Milagros Loll, MD ED   02/18/2015 0143 02/23/2015 1901 DNR [id number]  Ihor Austin, MD ED   02/12/2015 0336 02/14/2015 1747 DNR [id number]  Milagros Loll, MD ED   10/21/2014 1326 10/25/2014 1538 DNR [id number]  Deeann Saint, MD Inpatient   10/20/2014 1843 10/21/2014 1326 DNR [id number]  Ramonita Lab, MD Inpatient   10/20/2014 1517 10/20/2014 1843 Full Code [id number]  Deeann Saint, MD Inpatient    Advance Directive Documentation     Most Recent Value  Type of Advance Directive  Healthcare Power of Attorney, Out of facility DNR (pink MOST or yellow form), Living will  Pre-existing out of facility DNR order (yellow form or pink MOST form)  Yellow form placed in chart (order not valid for inpatient use)  "MOST" Form in Place?  -      TOTAL TIME TAKING CARE OF THIS PATIENT: 35 minutes.    Altamese Dilling M.D on 05/03/2017 at 10:25 AM  Between 7am to 6pm - Pager - [phone]  After 6pm go to [email or web address] - Social research officer, government  [facility]  Office  [phone]  CC: Primary care physician; Lauro Regulus, MD   Note: This dictation was prepared with Dragon dictation along with smaller phrase technology. Any transcriptional errors that result from this process are unintentional.

## 2017-05-03 NOTE — Clinical Social Work Note (Signed)
Patient to be d/c'ed today to [facility].  Patient and family agreeable to plans will transport via ems hospice home nurse liaison RN to call report.  Patient's family is at bedside and aware of patient's discharge today to hospice home.  Windell Moulding, MSW, [facility] [phone]

## 2017-05-03 NOTE — Progress Notes (Signed)
Follow up visit made to new hospice home referral. Pateint is a 84 year old woman admitted from [place] on 05/02/2017 with a  syncopal episode and unresponsiveness. She has a past medical history of  Diabetes, dementia, anxiety, GERD, hypertension, hypothyroidism, osteoporosis, and sacral decubitus ulcer. During her ED course patient experienced an episode of loud snoring respirations, frothy sputum, and became unarousable. She experienced a prolonged pause without pulse. She then regained spontaneous bradycardic pulse.  Palliative Medicine was consulted for goals of care and met with patient's daughter, who chose to focus on comfort with a transfer to the hospice home. Patient seen lying in bed alert, smiled and greeted Probation officer. Daughters at bedside. Writer initiated education regarding hospice services, philosophy and team approach to care with good understanding voiced. Questions answered, consents signed. Plan is for discharge today to the hospice home. Report called to the hospice home, EMS notified for transport. Hospital care team updated. Signed DNR in place in discharge packet. Flo Shanks RN, BSN, [place] [facility] [place], hospital Liaison [phone]

## 2017-05-03 NOTE — Care Management Obs Status (Signed)
MEDICARE OBSERVATION STATUS NOTIFICATION   Patient Details  Name: Gina [name] MRN: [id number] Date of Birth: 1917-08-01   Medicare Observation Status Notification Given:  No  Discharge order placed in < 24hr of being placed on observation  Eber Hong, RN 05/03/2017, 10:16 AM

## 2017-05-03 NOTE — Discharge Instructions (Signed)
Comfort measures

## 2017-05-03 NOTE — Progress Notes (Signed)
Nutrition Brief Note  Chart reviewed. Pt now transitioning to comfort care.  No further nutrition interventions warranted at this time.  Please re-consult as needed.   Zakk Borgen M. Dreanna Kyllo, MS, RD LDN Inpatient Clinical Dietitian Pager [phone]    

## 2017-06-01 DEATH — deceased
